# Patient Record
Sex: Male | Born: 1965 | Race: Black or African American | Hispanic: No | Smoking: Never smoker
Health system: Southern US, Community
[De-identification: ages and names within clinical notes are randomized; demographics above are authoritative.]

## PROBLEM LIST (undated history)

## (undated) DIAGNOSIS — E7800 Pure hypercholesterolemia, unspecified: Secondary | ICD-10-CM

## (undated) DIAGNOSIS — K219 Gastro-esophageal reflux disease without esophagitis: Secondary | ICD-10-CM

## (undated) DIAGNOSIS — C099 Malignant neoplasm of tonsil, unspecified: Secondary | ICD-10-CM

## (undated) DIAGNOSIS — I10 Essential (primary) hypertension: Secondary | ICD-10-CM

## (undated) DIAGNOSIS — L0292 Furuncle, unspecified: Secondary | ICD-10-CM

## (undated) HISTORY — PX: OTHER SURGICAL HISTORY: SHX169

## (undated) HISTORY — PX: EYE SURGERY: SHX253

---

## 2003-04-14 ENCOUNTER — Emergency Department (HOSPITAL_COMMUNITY): Admission: EM | Admit: 2003-04-14 | Discharge: 2003-04-14 | Payer: Self-pay | Admitting: *Deleted

## 2003-04-14 ENCOUNTER — Encounter: Payer: Self-pay | Admitting: *Deleted

## 2006-12-04 ENCOUNTER — Ambulatory Visit (HOSPITAL_COMMUNITY): Admission: RE | Admit: 2006-12-04 | Discharge: 2006-12-04 | Payer: Self-pay | Admitting: Urology

## 2008-10-23 ENCOUNTER — Encounter (INDEPENDENT_AMBULATORY_CARE_PROVIDER_SITE_OTHER): Payer: Self-pay | Admitting: General Surgery

## 2008-10-23 ENCOUNTER — Ambulatory Visit (HOSPITAL_COMMUNITY): Admission: RE | Admit: 2008-10-23 | Discharge: 2008-10-23 | Payer: Self-pay | Admitting: General Surgery

## 2010-07-15 ENCOUNTER — Emergency Department (HOSPITAL_COMMUNITY): Admission: EM | Admit: 2010-07-15 | Discharge: 2010-07-15 | Payer: Self-pay | Admitting: Emergency Medicine

## 2011-03-18 NOTE — Op Note (Signed)
[name], Jonathan Hudson            ACCOUNT NO.:  [id number]   MEDICAL RECORD NO.:  [id number]          PATIENT TYPE:  AMB   LOCATION:  DAY                           FACILITY:  [facility]   PHYSICIAN:  Dalia Heading, M.D.  DATE OF BIRTH:  1966/09/05   DATE OF PROCEDURE:  10/23/2008  DATE OF DISCHARGE:                               OPERATIVE REPORT   PREOPERATIVE DIAGNOSIS:  Ganglion cyst, left wrist.   POSTOPERATIVE DIAGNOSIS:  Ganglion cyst, left wrist.   PROCEDURE:  Excision of ganglion cyst, left wrist.   SURGEON:  Dalia Heading, MD   ANESTHESIA:  Regional.   INDICATIONS:  The patient is a 45 year old black male who presents with  a ganglion cyst of the left wrist.  The risks and benefits of the  procedure including bleeding, infection, and recurrence of the cyst were  fully explained to the patient, gave informed consent.   PROCEDURE NOTE:  The patient was in the supine position.  Regional  anesthesia was administered to the left arm.  A tourniquet was used  during the procedure.  A longitudinal incision was made over the cyst on  the left wrist.  This was taken down to the base of the cyst and the  cyst was excised without difficulty.  It was sent to Pathology for  further examination.  The subcutaneous layer was reapproximated using a  5-0 Vicryl interrupted suture.  The tourniquet was then released.  Any  bleeding was controlled using Bovie electrocautery.  The skin was closed  using 4-0 nylon interrupted sutures.  0.5% Sensorcaine was instilled  into the surrounding wound.  Betadine ointment and dry sterile dressing  were applied.  An Ace wrap was then applied.   All tape and needle counts were correct at the end of the procedure.  The patient was transferred to day surgery in stable condition.   COMPLICATIONS:  None.   SPECIMEN:  Ganglion cyst, left wrist.   BLOOD LOSS:  Minimal.      Dalia Heading, M.D.  Electronically Signed     MAJ/MEDQ  D:  10/23/2008   T:  10/23/2008  Job:  [id number]   cc:   Delbert Harness, MD

## 2011-03-21 NOTE — H&P (Signed)
Jonathan Hudson, Jonathan Hudson            ACCOUNT NO.:  [id number]   MEDICAL RECORD NO.:  [id number]          PATIENT TYPE:  AMB   LOCATION:  DAY                           FACILITY:  [facility]   PHYSICIAN:  Dalia Heading, M.D.  DATE OF BIRTH:  [birth year]   DATE OF ADMISSION:  DATE OF DISCHARGE:  LH                              HISTORY & PHYSICAL   CHIEF COMPLAINT:  Ganglion cyst, left wrist.   HISTORY OF PRESENT ILLNESS:  The patient is a 45 year old black male who  is referred for evaluation for treatment of a mass on his left wrist.  It has been present for several months.  It seems to be increasing in  size and is causing him discomfort.   PAST MEDICAL HISTORY:  High cholesterol levels.   PAST SURGICAL HISTORY:  Unremarkable.   CURRENT MEDICATIONS:  Lipitor.   ALLERGIES:  No known drug allergies.   REVIEW OF SYSTEMS:  The patient does smoke a pack of cigarettes a day.  Denies any alcohol use.  Denies any other cardiopulmonary difficulties  or bleeding disorders.   PHYSICAL EXAMINATION:  GENERAL:  The patient is a well-developed, well-  nourished black male in no acute distress.  LUNGS:  Clear to auscultation with equal breath sounds bilaterally.  HEART:  Regular rate and rhythm without S3, S4, or murmurs.  EXTREMITIES:  Reveals a 2-cm ovoid mobile mass in the left wrist  dorsally.   IMPRESSION:  Ganglion cyst, left wrist.   PLAN:  The patient is scheduled for excision of the ganglion cyst, left  wrist on October 23, 2008.  The risks and benefits of the procedure  including bleeding, infection, and recurrence of the cyst were fully  explained to the patient, gave informed consent.      Dalia Heading, M.D.     MAJ/MEDQ  D:  09/26/2008  T:  09/27/2008  Job:  [id number]   cc:   Melvyn Novas, MD  Fax: [phone]   Short Stay at [facility]

## 2011-03-21 NOTE — H&P (Signed)
Jonathan Hudson, Jonathan Hudson            ACCOUNT NO.:  [id number]   MEDICAL RECORD NO.:  [id number]          PATIENT TYPE:  AMB   LOCATION:  DAY                           FACILITY:  [facility]   PHYSICIAN:  Dalia Heading, M.D.  DATE OF BIRTH:  07-07-1966   DATE OF ADMISSION:  DATE OF DISCHARGE:  LH                              HISTORY & PHYSICAL   CHIEF COMPLAINT:  Ganglion cyst, left wrist.   HISTORY OF PRESENT ILLNESS:  The patient is a 45 year old black male who  is referred for evaluation for treatment of a mass on his left wrist.  It has been present for several months.  It seems to be increasing in  size and is causing him discomfort.   PAST MEDICAL HISTORY:  High cholesterol levels.   PAST SURGICAL HISTORY:  Unremarkable.   CURRENT MEDICATIONS:  Lipitor.   ALLERGIES:  No known drug allergies.   REVIEW OF SYSTEMS:  The patient does smoke a pack of cigarettes a day.  Denies any alcohol use.  Denies any other cardiopulmonary difficulties  or bleeding disorders.   PHYSICAL EXAMINATION:  GENERAL:  The patient is a well-developed, well-  nourished black male in no acute distress.  LUNGS:  Clear to auscultation with equal breath sounds bilaterally.  HEART:  Regular rate and rhythm without S3, S4, or murmurs.  EXTREMITIES:  Reveals a 2-cm ovoid mobile mass in the left wrist  dorsally.   IMPRESSION:  Ganglion cyst, left wrist.   PLAN:  The patient is scheduled for excision of the ganglion cyst, left  wrist on October 23, 2008.  The risks and benefits of the procedure  including bleeding, infection, and recurrence of the cyst were fully  explained to the patient, gave informed consent.      Dalia Heading, M.D.  Electronically Signed     MAJ/MEDQ  D:  09/26/2008  T:  09/27/2008  Job:  [id number]   cc:   Melvyn Novas, MD  Fax: [phone]   Short Stay at [facility]

## 2011-04-08 ENCOUNTER — Other Ambulatory Visit (HOSPITAL_COMMUNITY): Payer: Self-pay | Admitting: Orthopaedic Surgery

## 2011-04-08 DIAGNOSIS — R52 Pain, unspecified: Secondary | ICD-10-CM

## 2011-04-11 ENCOUNTER — Ambulatory Visit (HOSPITAL_COMMUNITY)
Admission: RE | Admit: 2011-04-11 | Discharge: 2011-04-11 | Disposition: A | Discharge status: Home / Self Care | Payer: BC Managed Care – PPO | Source: Ambulatory Visit | Attending: Orthopaedic Surgery | Admitting: Orthopaedic Surgery

## 2011-04-11 DIAGNOSIS — IMO0002 Reserved for concepts with insufficient information to code with codable children: Secondary | ICD-10-CM | POA: Insufficient documentation

## 2011-04-11 DIAGNOSIS — M75100 Unspecified rotator cuff tear or rupture of unspecified shoulder, not specified as traumatic: Secondary | ICD-10-CM | POA: Insufficient documentation

## 2011-04-11 DIAGNOSIS — M25519 Pain in unspecified shoulder: Secondary | ICD-10-CM | POA: Insufficient documentation

## 2011-04-11 DIAGNOSIS — M67919 Unspecified disorder of synovium and tendon, unspecified shoulder: Secondary | ICD-10-CM | POA: Insufficient documentation

## 2011-04-11 DIAGNOSIS — R52 Pain, unspecified: Secondary | ICD-10-CM

## 2011-04-11 DIAGNOSIS — M719 Bursopathy, unspecified: Secondary | ICD-10-CM | POA: Insufficient documentation

## 2011-08-08 LAB — BASIC METABOLIC PANEL
BUN: 11 mg/dL (ref 6–23)
CO2: 26 mEq/L (ref 19–32)
Calcium: 9.2 mg/dL (ref 8.4–10.5)
Chloride: 107 mEq/L (ref 96–112)
Creatinine, Ser: 0.89 mg/dL (ref 0.4–1.5)
GFR calc Af Amer: >60 mL/min (ref >60)
GFR calc non Af Amer: >60 mL/min (ref >60)
Glucose, Bld: 104 mg/dL — ABNORMAL HIGH (ref 70–99)
Potassium: 4.2 mEq/L (ref 3.5–5.1)
Sodium: 138 mEq/L (ref 135–145)

## 2011-08-08 LAB — CBC
HCT: 45.5 % (ref 39.0–52.0)
Hemoglobin: 14.9 g/dL (ref 13.0–17.0)
MCHC: 32.8 g/dL (ref 30.0–36.0)
MCV: 94.3 fL (ref 78.0–100.0)
Platelets: 243 10*3/uL (ref 150–400)
RBC: 4.83 MIL/uL (ref 4.22–5.81)
RDW: 13.9 % (ref 11.5–15.5)
WBC: 8.8 10*3/uL (ref 4.0–10.5)

## 2013-05-07 ENCOUNTER — Emergency Department (HOSPITAL_COMMUNITY)
Admission: EM | Admit: 2013-05-07 | Discharge: 2013-05-07 | Disposition: A | Discharge status: Home / Self Care | Payer: Managed Care, Other (non HMO) | Attending: Emergency Medicine | Admitting: Emergency Medicine

## 2013-05-07 ENCOUNTER — Encounter (HOSPITAL_COMMUNITY): Payer: Self-pay | Admitting: *Deleted

## 2013-05-07 DIAGNOSIS — Z79899 Other long term (current) drug therapy: Secondary | ICD-10-CM | POA: Insufficient documentation

## 2013-05-07 DIAGNOSIS — L0231 Cutaneous abscess of buttock: Secondary | ICD-10-CM | POA: Insufficient documentation

## 2013-05-07 DIAGNOSIS — L03319 Cellulitis of trunk, unspecified: Secondary | ICD-10-CM | POA: Insufficient documentation

## 2013-05-07 DIAGNOSIS — L02219 Cutaneous abscess of trunk, unspecified: Secondary | ICD-10-CM | POA: Insufficient documentation

## 2013-05-07 DIAGNOSIS — L02215 Cutaneous abscess of perineum: Secondary | ICD-10-CM

## 2013-05-07 DIAGNOSIS — L02419 Cutaneous abscess of limb, unspecified: Secondary | ICD-10-CM | POA: Insufficient documentation

## 2013-05-07 DIAGNOSIS — L03317 Cellulitis of buttock: Secondary | ICD-10-CM | POA: Insufficient documentation

## 2013-05-07 DIAGNOSIS — E7800 Pure hypercholesterolemia, unspecified: Secondary | ICD-10-CM | POA: Insufficient documentation

## 2013-05-07 HISTORY — DX: Pure hypercholesterolemia, unspecified: E78.00

## 2013-05-07 MED ORDER — SULFAMETHOXAZOLE-TRIMETHOPRIM 800-160 MG PO TABS: 1.0000 | ORAL_TABLET | Freq: Two times a day (BID) | ORAL | Status: AC

## 2013-05-07 MED ORDER — HYDROCODONE-ACETAMINOPHEN 5-325 MG PO TABS: 1.0000 | ORAL_TABLET | ORAL | Status: DC | PRN

## 2013-05-07 NOTE — ED Notes (Signed)
Pt states multiple boils to buttocks, left upper let and right groin area.

## 2013-05-07 NOTE — ED Provider Notes (Signed)
History    CSN: [id number] Arrival date & time 05/07/13  0865  First MD Initiated Contact with Patient 05/07/13 [phone]     Chief Complaint  Patient presents with  . Abscess   (Consider location/radiation/quality/duration/timing/severity/associated sxs/prior Treatment) HPI Comments: Jonathan Hudson is a 46 y.o. Male with a history of occasional boil, mostly in his groin which resolves with warm compresses  And spontaneous drainage.  He developed a similar boil in his right groin which has started to drain today using warm water soaks.  However,  He develop 2 new tender areas,  One on the upper posterior left leg and the other on his left buttock. Both areas are tender and are not yet draining.  Pain is worse with palpation and with sitting.  He has taken ibuprofen without relief of pain.  He denies fever, chills, nausea.     The history is provided by the patient.   Past Medical History  Diagnosis Date  . Hypercholesteremia    History reviewed. No pertinent past surgical history. No family history on file. History  Substance Use Topics  . Smoking status: Never Smoker   . Smokeless tobacco: Not on file  . Alcohol Use: Yes     Comment: Occ    Review of Systems  Constitutional: Negative for fever and chills.  HENT: Negative for facial swelling.   Respiratory: Negative for shortness of breath.   Gastrointestinal: Negative for nausea.  Skin: Positive for wound. Negative for color change.  Neurological: Negative for numbness.    Allergies  Review of patient's allergies indicates no known allergies.  Home Medications   Current Outpatient Rx  Name  Route  Sig  Dispense  Refill  . atorvastatin (LIPITOR) 40 MG tablet   Oral   Take 20 mg by mouth daily.         Marland Kitchen ibuprofen (ADVIL,MOTRIN) 800 MG tablet   Oral   Take 800 mg by mouth every 8 (eight) hours as needed for pain.         Marland Kitchen HYDROcodone-acetaminophen (NORCO/VICODIN) 5-325 MG per tablet   Oral   Take 1  tablet by mouth every 4 (four) hours as needed for pain.   20 tablet   0   . sulfamethoxazole-trimethoprim (BACTRIM DS,SEPTRA DS) 800-160 MG per tablet   Oral   Take 1 tablet by mouth 2 (two) times daily.   14 tablet   0    BP 139/88  Pulse 85  Temp(Src) 98.4 F (36.9 C) (Oral)  Resp 16  SpO2 98% Physical Exam  Constitutional: He appears well-developed and well-nourished. No distress.  HENT:  Head: Normocephalic.  Neck: Normal range of motion.  Cardiovascular: Normal rate.   Pulmonary/Chest: Effort normal.  Musculoskeletal: Normal range of motion. He exhibits no edema.  Skin:  Dime sized abscesses,  One in right groin which is draining,  Indurated lesions left upper thigh near gluteal fold,  The other on left medial buttock.  No fluctuance, no drainage, no surrounding erythema.     ED Course  Procedures (including critical care time) Labs Reviewed - No data to display No results found. 1. Abscess of multiple sites of perineum     MDM  Pt with 3 small skin infections/ one with active drainage,  The other 2 with no fluctuance, small, not ready for incision.  Encouraged continued warm soaks, prescribed bactrim, hydrocodone.  Encouraged return here or to pcp if lesions do not resolve spontaneously as the right groin appears to  be doing.  Pt understands plan.  Burgess Amor, PA-C 05/07/13 2148

## 2013-05-08 NOTE — ED Provider Notes (Signed)
Medical screening examination/treatment/procedure(s) were performed by non-physician practitioner and as supervising physician I was immediately available for consultation/collaboration.   Laray Anger, DO 05/08/13 Darliss Ridgel

## 2013-12-28 ENCOUNTER — Emergency Department (HOSPITAL_COMMUNITY): Payer: Managed Care, Other (non HMO)

## 2013-12-28 ENCOUNTER — Observation Stay (HOSPITAL_COMMUNITY)
Admission: EM | Admit: 2013-12-28 | Discharge: 2013-12-29 | Disposition: A | Discharge status: Home / Self Care | Payer: Managed Care, Other (non HMO) | Attending: Orthopedic Surgery | Admitting: Orthopedic Surgery

## 2013-12-28 ENCOUNTER — Encounter (HOSPITAL_COMMUNITY): Payer: Self-pay | Admitting: Emergency Medicine

## 2013-12-28 ENCOUNTER — Observation Stay (HOSPITAL_COMMUNITY): Payer: Managed Care, Other (non HMO)

## 2013-12-28 ENCOUNTER — Encounter (HOSPITAL_COMMUNITY): Payer: Managed Care, Other (non HMO) | Admitting: Anesthesiology

## 2013-12-28 ENCOUNTER — Encounter (HOSPITAL_COMMUNITY)
Admission: EM | Disposition: A | Discharge status: Home / Self Care | Payer: Self-pay | Source: Home / Self Care | Attending: Orthopedic Surgery

## 2013-12-28 ENCOUNTER — Observation Stay (HOSPITAL_COMMUNITY): Payer: Managed Care, Other (non HMO) | Admitting: Anesthesiology

## 2013-12-28 DIAGNOSIS — I10 Essential (primary) hypertension: Secondary | ICD-10-CM | POA: Insufficient documentation

## 2013-12-28 DIAGNOSIS — S52309A Unspecified fracture of shaft of unspecified radius, initial encounter for closed fracture: Principal | ICD-10-CM | POA: Insufficient documentation

## 2013-12-28 DIAGNOSIS — S52201A Unspecified fracture of shaft of right ulna, initial encounter for closed fracture: Secondary | ICD-10-CM | POA: Diagnosis present

## 2013-12-28 DIAGNOSIS — S5290XA Unspecified fracture of unspecified forearm, initial encounter for closed fracture: Secondary | ICD-10-CM | POA: Diagnosis present

## 2013-12-28 DIAGNOSIS — E7800 Pure hypercholesterolemia, unspecified: Secondary | ICD-10-CM | POA: Insufficient documentation

## 2013-12-28 DIAGNOSIS — W108XXA Fall (on) (from) other stairs and steps, initial encounter: Secondary | ICD-10-CM | POA: Insufficient documentation

## 2013-12-28 DIAGNOSIS — S52301A Unspecified fracture of shaft of right radius, initial encounter for closed fracture: Secondary | ICD-10-CM

## 2013-12-28 DIAGNOSIS — S52209A Unspecified fracture of shaft of unspecified ulna, initial encounter for closed fracture: Principal | ICD-10-CM

## 2013-12-28 HISTORY — PX: OPEN REDUCTION INTERNAL FIXATION (ORIF) DISTAL RADIAL FRACTURE: SHX5989

## 2013-12-28 HISTORY — PX: ORIF ULNAR FRACTURE: SHX5417

## 2013-12-28 LAB — SURGICAL PCR SCREEN
MRSA, PCR: NEGATIVE
Staphylococcus aureus: NEGATIVE

## 2013-12-28 LAB — CBC WITH DIFFERENTIAL/PLATELET
Basophils Absolute: 0 10*3/uL (ref 0.0–0.1)
Basophils Relative: 0 % (ref 0–1)
Eosinophils Absolute: 0.5 10*3/uL (ref 0.0–0.7)
Eosinophils Relative: 3 % (ref 0–5)
HCT: 43.2 % (ref 39.0–52.0)
Hemoglobin: 15 g/dL (ref 13.0–17.0)
Lymphocytes Relative: 44 % (ref 12–46)
Lymphs Abs: 6.6 10*3/uL — ABNORMAL HIGH (ref 0.7–4.0)
MCH: 32 pg (ref 26.0–34.0)
MCHC: 34.7 g/dL (ref 30.0–36.0)
MCV: 92.1 fL (ref 78.0–100.0)
Monocytes Absolute: 1.1 10*3/uL — ABNORMAL HIGH (ref 0.1–1.0)
Monocytes Relative: 7 % (ref 3–12)
Neutro Abs: 6.8 10*3/uL (ref 1.7–7.7)
Neutrophils Relative %: 46 % (ref 43–77)
Platelets: 238 10*3/uL (ref 150–400)
RBC: 4.69 MIL/uL (ref 4.22–5.81)
RDW: 13.9 % (ref 11.5–15.5)
WBC: 15 10*3/uL — ABNORMAL HIGH (ref 4.0–10.5)

## 2013-12-28 LAB — BASIC METABOLIC PANEL
BUN: 17 mg/dL (ref 6–23)
CO2: 24 mEq/L (ref 19–32)
Calcium: 8.9 mg/dL (ref 8.4–10.5)
Chloride: 103 mEq/L (ref 96–112)
Creatinine, Ser: 1.14 mg/dL (ref 0.50–1.35)
GFR calc Af Amer: 87 mL/min — ABNORMAL LOW (ref >90)
GFR calc non Af Amer: 75 mL/min — ABNORMAL LOW (ref >90)
Glucose, Bld: 146 mg/dL — ABNORMAL HIGH (ref 70–99)
Potassium: 3.5 mEq/L — ABNORMAL LOW (ref 3.7–5.3)
Sodium: 141 mEq/L (ref 137–147)

## 2013-12-28 SURGERY — OPEN REDUCTION INTERNAL FIXATION (ORIF) ULNAR FRACTURE
Anesthesia: General | Site: Arm Lower | Laterality: Right

## 2013-12-28 MED ORDER — METOCLOPRAMIDE HCL 10 MG PO TABS: 5.0000 mg | ORAL_TABLET | Freq: Three times a day (TID) | ORAL | Status: DC | PRN

## 2013-12-28 MED ORDER — BUPIVACAINE HCL (PF) 0.5 % IJ SOLN
INTRAMUSCULAR | Status: AC
  Filled 2013-12-28: qty 30

## 2013-12-28 MED ORDER — SUFENTANIL CITRATE 50 MCG/ML IV SOLN
INTRAVENOUS | Status: DC | PRN
  Administered 2013-12-28 (×5): 10 ug via INTRAVENOUS

## 2013-12-28 MED ORDER — DOCUSATE SODIUM 100 MG PO CAPS
100.0000 mg | ORAL_CAPSULE | Freq: Two times a day (BID) | ORAL | Status: DC
  Administered 2013-12-28 – 2013-12-29 (×2): 100 mg via ORAL
  Filled 2013-12-28 (×2): qty 1

## 2013-12-28 MED ORDER — ONDANSETRON HCL 4 MG/2ML IJ SOLN
4.0000 mg | Freq: Once | INTRAMUSCULAR | Status: AC
  Administered 2013-12-28: 4 mg via INTRAVENOUS
  Filled 2013-12-28: qty 2

## 2013-12-28 MED ORDER — LIDOCAINE HCL (PF) 1 % IJ SOLN
INTRAMUSCULAR | Status: AC
  Filled 2013-12-28: qty 5

## 2013-12-28 MED ORDER — LACTATED RINGERS IV SOLN
INTRAVENOUS | Status: DC
  Administered 2013-12-28: 12:00:00 via INTRAVENOUS

## 2013-12-28 MED ORDER — FENTANYL CITRATE 0.05 MG/ML IJ SOLN
INTRAMUSCULAR | Status: AC
  Filled 2013-12-28: qty 2

## 2013-12-28 MED ORDER — LISINOPRIL 5 MG PO TABS
5.0000 mg | ORAL_TABLET | Freq: Every day | ORAL | Status: DC
  Administered 2013-12-28 – 2013-12-29 (×2): 5 mg via ORAL
  Filled 2013-12-28 (×2): qty 1

## 2013-12-28 MED ORDER — ACETAMINOPHEN 325 MG PO TABS: 650.0000 mg | ORAL_TABLET | Freq: Four times a day (QID) | ORAL | Status: DC | PRN

## 2013-12-28 MED ORDER — MIDAZOLAM HCL 2 MG/2ML IJ SOLN
1.0000 mg | INTRAMUSCULAR | Status: DC | PRN
  Administered 2013-12-28: 2 mg via INTRAVENOUS

## 2013-12-28 MED ORDER — CEFAZOLIN SODIUM-DEXTROSE 2-3 GM-% IV SOLR
2.0000 g | INTRAVENOUS | Status: AC
  Administered 2013-12-28: 2 g via INTRAVENOUS
  Filled 2013-12-28: qty 50

## 2013-12-28 MED ORDER — MIDAZOLAM HCL 2 MG/2ML IJ SOLN
INTRAMUSCULAR | Status: AC
  Filled 2013-12-28: qty 2

## 2013-12-28 MED ORDER — ONDANSETRON HCL 4 MG PO TABS
4.0000 mg | ORAL_TABLET | Freq: Four times a day (QID) | ORAL | Status: DC | PRN
Start: 2013-12-28 — End: 2013-12-29

## 2013-12-28 MED ORDER — LACTATED RINGERS IV SOLN
INTRAVENOUS | Status: DC | PRN
  Administered 2013-12-28 (×2): via INTRAVENOUS

## 2013-12-28 MED ORDER — HYDROMORPHONE HCL PF 1 MG/ML IJ SOLN
1.0000 mg | Freq: Once | INTRAMUSCULAR | Status: AC
  Administered 2013-12-28: 1 mg via INTRAVENOUS
  Filled 2013-12-28: qty 1

## 2013-12-28 MED ORDER — PROPOFOL 10 MG/ML IV BOLUS
INTRAVENOUS | Status: DC | PRN
  Administered 2013-12-28: 170 mg via INTRAVENOUS

## 2013-12-28 MED ORDER — SUCCINYLCHOLINE CHLORIDE 20 MG/ML IJ SOLN
INTRAMUSCULAR | Status: DC | PRN
  Administered 2013-12-28: 140 mg via INTRAVENOUS

## 2013-12-28 MED ORDER — ONDANSETRON HCL 4 MG/2ML IJ SOLN: 4.0000 mg | Freq: Once | INTRAMUSCULAR | Status: DC | PRN

## 2013-12-28 MED ORDER — SUCCINYLCHOLINE CHLORIDE 20 MG/ML IJ SOLN
INTRAMUSCULAR | Status: AC
  Filled 2013-12-28: qty 1

## 2013-12-28 MED ORDER — OXYCODONE-ACETAMINOPHEN 5-325 MG PO TABS
1.0000 | ORAL_TABLET | ORAL | Status: DC
  Administered 2013-12-28 – 2013-12-29 (×5): 1 via ORAL
  Filled 2013-12-28 (×5): qty 1

## 2013-12-28 MED ORDER — FENTANYL CITRATE 0.05 MG/ML IJ SOLN
INTRAMUSCULAR | Status: DC | PRN
  Administered 2013-12-28 (×2): 50 ug via INTRAVENOUS

## 2013-12-28 MED ORDER — PHENOL 1.4 % MT LIQD: 1.0000 | OROMUCOSAL | Status: DC | PRN

## 2013-12-28 MED ORDER — GLYCOPYRROLATE 0.2 MG/ML IJ SOLN
0.2000 mg | Freq: Once | INTRAMUSCULAR | Status: AC
  Administered 2013-12-28: 0.2 mg via INTRAVENOUS

## 2013-12-28 MED ORDER — GLYCOPYRROLATE 0.2 MG/ML IJ SOLN
INTRAMUSCULAR | Status: AC
  Filled 2013-12-28: qty 1

## 2013-12-28 MED ORDER — HYDROMORPHONE HCL PF 1 MG/ML IJ SOLN
1.0000 mg | Freq: Once | INTRAMUSCULAR | Status: AC
  Administered 2013-12-28: 1 mg via INTRAVENOUS

## 2013-12-28 MED ORDER — MENTHOL 3 MG MT LOZG: 1.0000 | LOZENGE | OROMUCOSAL | Status: DC | PRN

## 2013-12-28 MED ORDER — FENTANYL CITRATE 0.05 MG/ML IJ SOLN
INTRAMUSCULAR | Status: AC
Start: 2013-12-28 — End: 2013-12-28
  Filled 2013-12-28: qty 5

## 2013-12-28 MED ORDER — ROCURONIUM BROMIDE 50 MG/5ML IV SOLN
INTRAVENOUS | Status: AC
  Filled 2013-12-28: qty 1

## 2013-12-28 MED ORDER — ONDANSETRON HCL 4 MG/2ML IJ SOLN
INTRAMUSCULAR | Status: AC
  Filled 2013-12-28: qty 2

## 2013-12-28 MED ORDER — CEFAZOLIN SODIUM-DEXTROSE 2-3 GM-% IV SOLR
INTRAVENOUS | Status: AC
  Filled 2013-12-28: qty 50

## 2013-12-28 MED ORDER — GLYCOPYRROLATE 0.2 MG/ML IJ SOLN
INTRAMUSCULAR | Status: DC | PRN
  Administered 2013-12-28: 0.2 mg via INTRAVENOUS

## 2013-12-28 MED ORDER — MORPHINE SULFATE 4 MG/ML IJ SOLN: 4.0000 mg | INTRAMUSCULAR | Status: DC | PRN

## 2013-12-28 MED ORDER — OXYCODONE HCL 5 MG PO TABS
5.0000 mg | ORAL_TABLET | Freq: Once | ORAL | Status: AC
  Administered 2013-12-28: 5 mg via ORAL
  Filled 2013-12-28: qty 1

## 2013-12-28 MED ORDER — SODIUM CHLORIDE 0.9 % IR SOLN
Status: DC | PRN
  Administered 2013-12-28 (×2): 1000 mL

## 2013-12-28 MED ORDER — KETOROLAC TROMETHAMINE 30 MG/ML IJ SOLN
30.0000 mg | Freq: Once | INTRAMUSCULAR | Status: AC
  Administered 2013-12-28: 30 mg via INTRAVENOUS
  Filled 2013-12-28: qty 1

## 2013-12-28 MED ORDER — ONDANSETRON HCL 4 MG/2ML IJ SOLN: 4.0000 mg | Freq: Four times a day (QID) | INTRAMUSCULAR | Status: DC | PRN

## 2013-12-28 MED ORDER — ONDANSETRON HCL 4 MG/2ML IJ SOLN: 4.0000 mg | Freq: Three times a day (TID) | INTRAMUSCULAR | Status: DC | PRN

## 2013-12-28 MED ORDER — ALUM & MAG HYDROXIDE-SIMETH 200-200-20 MG/5ML PO SUSP: 30.0000 mL | ORAL | Status: DC | PRN

## 2013-12-28 MED ORDER — PROPOFOL 10 MG/ML IV BOLUS
INTRAVENOUS | Status: AC
  Filled 2013-12-28: qty 20

## 2013-12-28 MED ORDER — BUPIVACAINE-EPINEPHRINE PF 0.5-1:200000 % IJ SOLN
INTRAMUSCULAR | Status: DC | PRN
  Administered 2013-12-28: 30 mL

## 2013-12-28 MED ORDER — SUFENTANIL CITRATE 50 MCG/ML IV SOLN
INTRAVENOUS | Status: AC
  Filled 2013-12-28: qty 1

## 2013-12-28 MED ORDER — METOCLOPRAMIDE HCL 5 MG/ML IJ SOLN: 5.0000 mg | Freq: Three times a day (TID) | INTRAMUSCULAR | Status: DC | PRN

## 2013-12-28 MED ORDER — POTASSIUM CHLORIDE 10 MEQ/100ML IV SOLN
10.0000 meq | Freq: Once | INTRAVENOUS | Status: AC
Start: 2013-12-28 — End: 2013-12-28
  Administered 2013-12-28: 10 meq via INTRAVENOUS
  Filled 2013-12-28: qty 100

## 2013-12-28 MED ORDER — CEFAZOLIN SODIUM-DEXTROSE 2-3 GM-% IV SOLR
2.0000 g | Freq: Four times a day (QID) | INTRAVENOUS | Status: AC
  Administered 2013-12-28 – 2013-12-29 (×2): 2 g via INTRAVENOUS
  Filled 2013-12-28 (×2): qty 50

## 2013-12-28 MED ORDER — ATORVASTATIN CALCIUM 20 MG PO TABS
20.0000 mg | ORAL_TABLET | Freq: Every day | ORAL | Status: DC
  Administered 2013-12-28: 20 mg via ORAL
  Filled 2013-12-28: qty 1

## 2013-12-28 MED ORDER — MORPHINE SULFATE 2 MG/ML IJ SOLN
0.5000 mg | INTRAMUSCULAR | Status: DC | PRN
  Administered 2013-12-28 – 2013-12-29 (×2): 0.5 mg via INTRAVENOUS
  Filled 2013-12-28 (×2): qty 1

## 2013-12-28 MED ORDER — POTASSIUM CHLORIDE IN NACL 20-0.9 MEQ/L-% IV SOLN: INTRAVENOUS | Status: DC

## 2013-12-28 MED ORDER — HYDROMORPHONE HCL PF 1 MG/ML IJ SOLN
INTRAMUSCULAR | Status: AC
  Administered 2013-12-28: 1 mg via INTRAVENOUS
  Filled 2013-12-28: qty 1

## 2013-12-28 MED ORDER — ACETAMINOPHEN 650 MG RE SUPP: 650.0000 mg | Freq: Four times a day (QID) | RECTAL | Status: DC | PRN

## 2013-12-28 MED ORDER — FENTANYL CITRATE 0.05 MG/ML IJ SOLN
25.0000 ug | INTRAMUSCULAR | Status: DC | PRN
  Administered 2013-12-28 (×2): 50 ug via INTRAVENOUS

## 2013-12-28 MED ORDER — HYDROMORPHONE HCL PF 1 MG/ML IJ SOLN
1.0000 mg | INTRAMUSCULAR | Status: DC | PRN
  Administered 2013-12-28: 1 mg via INTRAVENOUS
  Filled 2013-12-28: qty 1

## 2013-12-28 MED ORDER — ONDANSETRON HCL 4 MG/2ML IJ SOLN
4.0000 mg | Freq: Once | INTRAMUSCULAR | Status: AC
  Administered 2013-12-28: 4 mg via INTRAVENOUS

## 2013-12-28 MED ORDER — FENTANYL CITRATE 0.05 MG/ML IJ SOLN
25.0000 ug | INTRAMUSCULAR | Status: AC
  Administered 2013-12-28 (×2): 25 ug via INTRAVENOUS

## 2013-12-28 MED ORDER — POTASSIUM CHLORIDE IN NACL 20-0.9 MEQ/L-% IV SOLN
INTRAVENOUS | Status: DC
  Administered 2013-12-28: 07:00:00 via INTRAVENOUS

## 2013-12-28 MED ORDER — CHLORHEXIDINE GLUCONATE 4 % EX LIQD
60.0000 mL | Freq: Once | CUTANEOUS | Status: AC
  Administered 2013-12-28: 4 via TOPICAL
  Filled 2013-12-28: qty 60

## 2013-12-28 SURGICAL SUPPLY — 64 items
BAG HAMPER (MISCELLANEOUS) ×2 IMPLANT
BANDAGE ELASTIC 2 VELCRO NS LF (GAUZE/BANDAGES/DRESSINGS) ×1 IMPLANT
BANDAGE ELASTIC 3 VELCRO NS (GAUZE/BANDAGES/DRESSINGS) ×1 IMPLANT
BANDAGE ELASTIC 3 VELCRO ST LF (GAUZE/BANDAGES/DRESSINGS) IMPLANT
BANDAGE ESMARK 4X12 BL STRL LF (DISPOSABLE) ×1 IMPLANT
BANDAGE GAUZE ELAST BULKY 4 IN (GAUZE/BANDAGES/DRESSINGS) ×1 IMPLANT
BIT DRILL 2.8 (BIT) ×1
BIT DRILL CANN QC 2.8X165 (BIT) IMPLANT
BLADE SURG SZ10 CARB STEEL (BLADE) ×2 IMPLANT
BNDG CMPR 12X4 ELC STRL LF (DISPOSABLE) ×1
BNDG COHESIVE 4X5 TAN STRL (GAUZE/BANDAGES/DRESSINGS) ×2 IMPLANT
BNDG ESMARK 4X12 BLUE STRL LF (DISPOSABLE) ×2
CHLORAPREP W/TINT 26ML (MISCELLANEOUS) ×2 IMPLANT
CLOTH BEACON ORANGE TIMEOUT ST (SAFETY) ×2 IMPLANT
COVER LIGHT HANDLE STERIS (MISCELLANEOUS) ×8 IMPLANT
COVER PROBE W GEL 5X96 (DRAPES) ×2 IMPLANT
CUFF TOURNIQUET SINGLE 18IN (TOURNIQUET CUFF) ×2 IMPLANT
DRAPE C-ARM FOLDED MOBILE STRL (DRAPES) ×2 IMPLANT
DRILL BIT 2.8MM (BIT) ×2
DRSG XEROFORM 1X8 (GAUZE/BANDAGES/DRESSINGS) IMPLANT
ELECT REM PT RETURN 9FT ADLT (ELECTROSURGICAL) ×2
ELECTRODE REM PT RTRN 9FT ADLT (ELECTROSURGICAL) ×1 IMPLANT
GAUZE KERLIX 2  STERILE LF (GAUZE/BANDAGES/DRESSINGS) ×2 IMPLANT
GAUZE XEROFORM 5X9 LF (GAUZE/BANDAGES/DRESSINGS) ×1 IMPLANT
GLOVE BIOGEL PI IND STRL 7.0 (GLOVE) IMPLANT
GLOVE BIOGEL PI INDICATOR 7.0 (GLOVE) ×3
GLOVE ECLIPSE 6.5 STRL STRAW (GLOVE) ×2 IMPLANT
GLOVE EXAM NITRILE MD LF STRL (GLOVE) ×2 IMPLANT
GLOVE OPTIFIT SS 6.5 STRL BRWN (GLOVE) ×1 IMPLANT
GLOVE SKINSENSE NS SZ8.0 LF (GLOVE) ×1
GLOVE SKINSENSE STRL SZ8.0 LF (GLOVE) ×1 IMPLANT
GLOVE SS N UNI LF 8.5 STRL (GLOVE) ×2 IMPLANT
GOWN STRL REUS W/TWL LRG LVL3 (GOWN DISPOSABLE) ×5 IMPLANT
GOWN STRL REUS W/TWL XL LVL3 (GOWN DISPOSABLE) ×4 IMPLANT
INST SET MINOR BONE (KITS) ×2 IMPLANT
K-WIRE 1.25 TRCR POINT 150 (WIRE) ×2
KIT ROOM TURNOVER APOR (KITS) ×2 IMPLANT
KWIRE 1.25 TRCR POINT 150 (WIRE) IMPLANT
MANIFOLD NEPTUNE II (INSTRUMENTS) ×2 IMPLANT
NDL HYPO 21X1.5 SAFETY (NEEDLE) ×1 IMPLANT
NEEDLE HYPO 21X1.5 SAFETY (NEEDLE) ×2 IMPLANT
NS IRRIG 1000ML POUR BTL (IV SOLUTION) ×3 IMPLANT
PACK BASIC LIMB (CUSTOM PROCEDURE TRAY) ×3 IMPLANT
PAD ARMBOARD 7.5X6 YLW CONV (MISCELLANEOUS) ×2 IMPLANT
PROS LCP PLATE 6H 85MM (Plate) ×2 IMPLANT
PROS LCP PLATE 9H 124M (Plate) ×2 IMPLANT
PROSTHESIS LCP PLATE 6H 85MM (Plate) IMPLANT
PROSTHESIS LCP PLATE 9H 124M (Plate) IMPLANT
SCREW CORTEX 3.5 16MM (Screw) ×6 IMPLANT
SCREW CORTEX 3.5 18MM (Screw) ×5 IMPLANT
SCREW LOCK CORT ST 3.5X16 (Screw) IMPLANT
SCREW LOCK CORT ST 3.5X18 (Screw) IMPLANT
SET BASIN LINEN APH (SET/KITS/TRAYS/PACK) ×2 IMPLANT
SPLINT IMMOBILIZER J 3INX20FT (CAST SUPPLIES) ×1
SPLINT J IMMOBILIZER 3X20FT (CAST SUPPLIES) ×1 IMPLANT
SPONGE GAUZE 4X4 12PLY (GAUZE/BANDAGES/DRESSINGS) ×3 IMPLANT
STAPLER VISISTAT 35W (STAPLE) ×3 IMPLANT
SUT ETHILON 3 0 FSL (SUTURE) IMPLANT
SUT MON AB 2-0 SH 27 (SUTURE) ×4
SUT MON AB 2-0 SH27 (SUTURE) ×1 IMPLANT
SYR 30ML LL (SYRINGE) ×1 IMPLANT
SYR CONTROL 10ML LL (SYRINGE) ×2 IMPLANT
WATER STERILE IRR 1000ML POUR (IV SOLUTION) ×2 IMPLANT
kwire ×1 IMPLANT

## 2013-12-28 NOTE — Progress Notes (Signed)
Patient ID: Jonathan Hudson, male   DOB: 08-04-1966, 48 y.o.   MRN: [id number]  Discussed with Dr Roxanne Mins   BBFA fracture displaced   Admit   Surgery Wed

## 2013-12-28 NOTE — H&P (Signed)
Jonathan Hudson is an 48 y.o. male.   Chief Complaint: Right forearm pain  HPI: 48 year-old male with hypertension mild hypercholesterolemia fell on February 25 going up some steps landing on his right forearm sustaining a displaced closed both bone forearm fracture. Presented to the ER with pain swelling deformity and intact neurovascular function    Past Medical History  Diagnosis Date  . Hypercholesteremia     History reviewed. No pertinent past surgical history.  History reviewed. No pertinent family history. Social History:  reports that he has never smoked. He does not have any smokeless tobacco history on file. He reports that he drinks alcohol. He reports that he does not use illicit drugs.  Allergies: No Known Allergies   (Not in a hospital admission)  Results for orders placed during the hospital encounter of 12/28/13 (from the past 48 hour(s))  CBC WITH DIFFERENTIAL     Status: Abnormal   Collection Time    12/28/13  1:52 AM      Result Value Ref Range   WBC 15.0 (*) 4.0 - 10.5 K/uL   RBC 4.69  4.22 - 5.81 MIL/uL   Hemoglobin 15.0  13.0 - 17.0 g/dL   HCT 43.2  39.0 - 52.0 %   MCV 92.1  78.0 - 100.0 fL   MCH 32.0  26.0 - 34.0 pg   MCHC 34.7  30.0 - 36.0 g/dL   RDW 13.9  11.5 - 15.5 %   Platelets 238  150 - 400 K/uL   Neutrophils Relative % 46  43 - 77 %   Lymphocytes Relative 44  12 - 46 %   Monocytes Relative 7  3 - 12 %   Eosinophils Relative 3  0 - 5 %   Basophils Relative 0  0 - 1 %   Neutro Abs 6.8  1.7 - 7.7 K/uL   Lymphs Abs 6.6 (*) 0.7 - 4.0 K/uL   Monocytes Absolute 1.1 (*) 0.1 - 1.0 K/uL   Eosinophils Absolute 0.5  0.0 - 0.7 K/uL   Basophils Absolute 0.0  0.0 - 0.1 K/uL   WBC Morphology WHITE COUNT CONFIRMED ON SMEAR     Smear Review PENDING PATHOLOGIST REVIEW    BASIC METABOLIC PANEL     Status: Abnormal   Collection Time    12/28/13  1:52 AM      Result Value Ref Range   Sodium 141  137 - 147 mEq/L   Potassium 3.5 (*) 3.7 - 5.3 mEq/L    Chloride 103  96 - 112 mEq/L   CO2 24  19 - 32 mEq/L   Glucose, Bld 146 (*) 70 - 99 mg/dL   BUN 17  6 - 23 mg/dL   Creatinine, Ser 1.14  0.50 - 1.35 mg/dL   Calcium 8.9  8.4 - 10.5 mg/dL   GFR calc non Af Amer 75 (*) >90 mL/min   GFR calc Af Amer 87 (*) >90 mL/min   Comment: (NOTE)     The eGFR has been calculated using the CKD EPI equation.     This calculation has not been validated in all clinical situations.     eGFR's persistently <90 mL/min signify possible Chronic Kidney     Disease.   Dg Forearm Right  12/28/2013   CLINICAL DATA:  Fall down stairs.  EXAM: RIGHT FOREARM - 2 VIEW  COMPARISON:  None available for comparison at time of study interpretation.  FINDINGS: Oblique distal radius and ulnar diaphyseal fractures with  dorsal angulated fracture apex, overriding riding bony fragment. No dislocation. No destructive bony lesions. Soft tissue planes are nonsuspicious.  IMPRESSION: Distal radius and ulnar diaphyseal angulated fractures with impaction. No dislocation.   Electronically Signed   By: Elon Alas   On: 12/28/2013 02:41    ROS normal review of systems  Blood pressure 118/65, pulse 72, temperature 97.6 F (36.4 C), temperature source Oral, resp. rate 18, height $RemoveBe'6\' 2"'ZtMlsSVDM$  (1.88 m), weight 250 lb (113.399 kg), SpO2 98.00%. Physical Exam   General and hygiene are normal. Development and nutrition are normal. Body habitus normal Mood Affect are normal The patient is alert and oriented x3 Ambulatory status normal  RUE (include skin) Inspection reveals a splint in place with the fingers and hand available for examination. Capillary refill is normal all flexor and extensor tendons are intact. Splint is in place without excessive swelling. Right shoulder normal. LUE Inspection reveals no tenderness or swelling,  range of motion is normal. The joints are located without subluxation or laxity. Motor exam reveals grade 5 strength;  skin is without rash lesion or  ulcer  RLE Inspection reveals no tenderness or swelling;  range of motion is normal. The joints are located without subluxation or laxity. Motor exam reveals grade 5 strength and the skin is without rash lesion or ulcer  LLE Inspection reveals no tenderness or swelling,  range of motion is normal. The joints are located without subluxation or laxity. Motor exam reveals grade 5 strength and  the skin is without rash lesion or ulcer  CDV peripheral pulses are intact without swelling or varicose veins  LYMPH are normal in all 4 extremities with no palpable nodes  DTR are equal and symmetric Balance  is normal     Assessment/Plan Closed fracture right radius and ulna/both bone forearm fracture  Recommend open reduction internal fixation.  The risks and benefits of the surgery have been explained. Excepted treatment is internal fixation. Complications include but are not limited to neurovascular injury. Stiffness. Infection.  The patient is demonstrated understanding of these risks and benefits. Alternative treatment is not an acceptable treatment in this clinical setting.  Arther Abbott 12/28/2013, 3:28 AM

## 2013-12-28 NOTE — ED Notes (Signed)
Patient presents s/p fall.  Patient was falling backward and landed on right arm.  Obvious deformity noted to forearm.

## 2013-12-28 NOTE — Anesthesia Procedure Notes (Signed)
Procedure Name: Intubation Date/Time: 12/28/2013 1:13 PM Performed by: Andree Elk, Edison Wollschlager A Pre-anesthesia Checklist: Patient identified, Patient being monitored, Timeout performed, Emergency Drugs available and Suction available Patient Re-evaluated:Patient Re-evaluated prior to inductionOxygen Delivery Method: Circle System Utilized Preoxygenation: Pre-oxygenation with 100% oxygen Intubation Type: IV induction Ventilation: Mask ventilation without difficulty Laryngoscope Size: 3 and Miller Grade View: Grade I Tube type: Oral Tube size: 7.0 mm Number of attempts: 1 Airway Equipment and Method: stylet Placement Confirmation: ETT inserted through vocal cords under direct vision,  positive ETCO2 and breath sounds checked- equal and bilateral Secured at: 21 cm Tube secured with: Tape Dental Injury: Teeth and Oropharynx as per pre-operative assessment

## 2013-12-28 NOTE — ED Provider Notes (Signed)
CSN: [id number]     Arrival date & time 12/28/13  0137 History   First MD Initiated Contact with Patient 12/28/13 0144     Chief Complaint  Patient presents with  . Arm Injury     (Consider location/radiation/quality/duration/timing/severity/associated sxs/prior Treatment) Patient is a 48 y.o. male presenting with arm injury. The history is provided by the patient.  Arm Injury He slipped on some ice while going up steps and injured his right arm. He denies other injury. Pain is severe and he rates at 10/10. He denies numbness or tingling. Pain is worse with any movement or with palpation. Nothing makes it better.  Past Medical History  Diagnosis Date  . Hypercholesteremia    History reviewed. No pertinent past surgical history. No family history on file. History  Substance Use Topics  . Smoking status: Never Smoker   . Smokeless tobacco: Not on file  . Alcohol Use: Yes     Comment: Occ    Review of Systems  All other systems reviewed and are negative.      Allergies  Review of patient's allergies indicates no known allergies.  Home Medications   Current Outpatient Rx  Name  Route  Sig  Dispense  Refill  . atorvastatin (LIPITOR) 40 MG tablet   Oral   Take 20 mg by mouth daily.         Marland Kitchen HYDROcodone-acetaminophen (NORCO/VICODIN) 5-325 MG per tablet   Oral   Take 1 tablet by mouth every 4 (four) hours as needed for pain.   20 tablet   0   . ibuprofen (ADVIL,MOTRIN) 800 MG tablet   Oral   Take 800 mg by mouth every 8 (eight) hours as needed for pain.          BP 115/70  Pulse 74  Temp(Src) 97.6 F (36.4 C) (Oral)  Resp 18  Ht 6\' 2"  (1.88 m)  Wt 250 lb (113.399 kg)  BMI 32.08 kg/m2  SpO2 100% Physical Exam  Nursing note and vitals reviewed.  48 year old male, who is obviously in pain, but his in no acute distress. Vital signs are normal. Oxygen saturation is 100%, which is normal. Head is normocephalic and atraumatic. PERRLA, EOMI. Oropharynx is  clear. Neck is nontender and supple without adenopathy or JVD. Back is nontender and there is no CVA tenderness. Lungs are clear without rales, wheezes, or rhonchi. Chest is nontender. Heart has regular rate and rhythm without murmur. Abdomen is soft, flat, nontender without masses or hepatosplenomegaly and peristalsis is normoactive. Extremities: Right arm has swelling from just distal to the elbow to approximately 5 cm proximal to the wrist. There is an abrasion over this entire area as well. Impression actually extends to just above the elbow. There is marked tenderness throughout this area. Distal neurovascular exam is intact with strong pulses, prompt capillary refill, normal sensation, and normal motor function. Remainder of extremity exam is normal. Skin is warm and dry without rash. Neurologic: Mental status is normal, cranial nerves are intact, there are no motor or sensory deficits.  ED Course  SPLINT APPLICATION Date/Time: 3/76/2831 3:50 AM Performed by: Delora Fuel Authorized by: Roxanne Mins, Sneijder Bernards Consent: Verbal consent obtained. written consent not obtained. Risks and benefits: risks, benefits and alternatives were discussed Consent given by: patient Patient understanding: patient states understanding of the procedure being performed Patient consent: the patient's understanding of the procedure matches consent given Procedure consent: procedure consent matches procedure scheduled Relevant documents: relevant documents present and verified Test  results: test results available and properly labeled Site marked: the operative site was marked Imaging studies: imaging studies available Required items: required blood products, implants, devices, and special equipment available Patient identity confirmed: verbally with patient and arm band Time out: Immediately prior to procedure a "time out" was called to verify the correct patient, procedure, equipment, support staff and site/side  marked as required. Location details: right arm Splint type: sugar tong Supplies used: cotton padding,  elastic bandage and Ortho-Glass Post-procedure: The splinted body part was neurovascularly unchanged following the procedure. Patient tolerance: Patient tolerated the procedure well with no immediate complications.   (including critical care time) Labs Review Results for orders placed during the hospital encounter of 12/28/13  CBC WITH DIFFERENTIAL      Result Value Ref Range   WBC 15.0 (*) 4.0 - 10.5 K/uL   RBC 4.69  4.22 - 5.81 MIL/uL   Hemoglobin 15.0  13.0 - 17.0 g/dL   HCT 43.2  39.0 - 52.0 %   MCV 92.1  78.0 - 100.0 fL   MCH 32.0  26.0 - 34.0 pg   MCHC 34.7  30.0 - 36.0 g/dL   RDW 13.9  11.5 - 15.5 %   Platelets 238  150 - 400 K/uL   Neutrophils Relative % 46  43 - 77 %   Lymphocytes Relative 44  12 - 46 %   Monocytes Relative 7  3 - 12 %   Eosinophils Relative 3  0 - 5 %   Basophils Relative 0  0 - 1 %   Neutro Abs 6.8  1.7 - 7.7 K/uL   Lymphs Abs 6.6 (*) 0.7 - 4.0 K/uL   Monocytes Absolute 1.1 (*) 0.1 - 1.0 K/uL   Eosinophils Absolute 0.5  0.0 - 0.7 K/uL   Basophils Absolute 0.0  0.0 - 0.1 K/uL   WBC Morphology WHITE COUNT CONFIRMED ON SMEAR     Smear Review PENDING PATHOLOGIST REVIEW    BASIC METABOLIC PANEL      Result Value Ref Range   Sodium 141  137 - 147 mEq/L   Potassium 3.5 (*) 3.7 - 5.3 mEq/L   Chloride 103  96 - 112 mEq/L   CO2 24  19 - 32 mEq/L   Glucose, Bld 146 (*) 70 - 99 mg/dL   BUN 17  6 - 23 mg/dL   Creatinine, Ser 1.14  0.50 - 1.35 mg/dL   Calcium 8.9  8.4 - 10.5 mg/dL   GFR calc non Af Amer 75 (*) >90 mL/min   GFR calc Af Amer 87 (*) >90 mL/min   Imaging Review Dg Forearm Right  12/28/2013   CLINICAL DATA:  Fall down stairs.  EXAM: RIGHT FOREARM - 2 VIEW  COMPARISON:  None available for comparison at time of study interpretation.  FINDINGS: Oblique distal radius and ulnar diaphyseal fractures with dorsal angulated fracture apex, overriding  riding bony fragment. No dislocation. No destructive bony lesions. Soft tissue planes are nonsuspicious.  IMPRESSION: Distal radius and ulnar diaphyseal angulated fractures with impaction. No dislocation.   Electronically Signed   By: Elon Alas   On: 12/28/2013 02:41   Images viewed by me.  MDM   Final diagnoses:  Fall on steps  Closed fracture of shaft of right radius and ulna    Right arm injury. He he is sent for x-ray and is given hydromorphone for pain.   X-ray confirms fracture of the shaft of both radius and Alma. Case is discussed with  Dr. Aline Brochure he wishes the patient to be admitted for surgical management and he requests sugar tong splint be applied. This is done with significant improvement in pain control. Screening labs are obtained showing mild hypokalemia and will be given potassium intravenously.  Delora Fuel, MD [id number]

## 2013-12-28 NOTE — Preoperative (Signed)
Beta Blockers   Reason not to administer Beta Blockers:Not Applicable 

## 2013-12-28 NOTE — Op Note (Signed)
12/28/2013  3:30 PM  PATIENT:  Jonathan Hudson  48 y.o. male  PRE-OPERATIVE DIAGNOSIS:  closed right both bone forearm fracture  POST-OPERATIVE DIAGNOSIS:  closed right both bone forearm fracture  PROCEDURE:  Procedure(s): OPEN REDUCTION INTERNAL FIXATION (ORIF) ULNAR FRACTURE (Right) OPEN REDUCTION INTERNAL FIXATION (ORIF) DISTAL RADIAL FRACTURE (Right)  Implant Synthes LC-DCP plate 9 hole plate on the ulna 6-hole plate on the radius a total of 15 screws  Operative findings comminuted ulnar fracture with butterfly fragment transverse radius fracture both midshaft to slightly distal third   SURGEON:  Surgeon(s) and Role:    * Carole Civil, MD - Primary  PHYSICIAN ASSISTANT:   ASSISTANTS: Corrie Dandy   ANESTHESIA:   general  EBL:  Total I/O In: 1000 [I.V.:1000] Out: 325 [Urine:300; Blood:25]  BLOOD ADMINISTERED:none  DRAINS: none   LOCAL MEDICATIONS USED:  MARCAINE     SPECIMEN:  No Specimen  DISPOSITION OF SPECIMEN:  No specimen  COUNTS:  YES  TOURNIQUET:   Total Tourniquet Time Documented: Upper Arm (Right) - 113 minutes Total: Upper Arm (Right) - 113 minutes   DICTATION: .Viviann Spare Dictation  PLAN OF CARE: Admit to inpatient   PATIENT DISPOSITION:  PACU - hemodynamically stable.   Delay start of Pharmacological VTE agent (>24hrs) due to surgical blood loss or risk of bleeding: yes  Operative technique Site marking chart update completed and preop  Patient taken to surgery for general anesthesia. In the supine position the right arm was prepped and draped sterilely with a tourniquet applied proximally after timeout procedure skin markings were made for the 2 volar incisions the 7 mm skin bridge in between  We first addressed the radius we did a volar Henry approach we protected the radial artery. We did subperiosteal dissection clean the fracture manipulated into position and held with a bone clamp  We then approached the ulna we made a volar  incision on the volar side of the ulna we did this incision through subcutaneous tissue we found the fracture we exposed bone and found a comminuted fracture with a large butterfly fragment which was not appreciated on x-ray  Reduce this fracture and held with a bone clamp and took a radiograph and found that both fractures were able to be reduced  We then turned our attention back to the radius and applied 2 screws one on each side of the fracture with a 6-hole plate. We then turned our attention back to the ulna. We had difficulty reducing the ulnar fracture butterfly fragment and maintaining length. After several attempts we pinned the butterfly fragment in place and then applied the plate to one side of the fracture and then reduce to bone to the plate. We were unable to get 6 cortices on the distal portion of the ulna.  We were able to get 6 cortices proximally  Radiographically in AP and lateral plane which showed anatomic reduction. We then flexed and extended the elbow we were able to obtain full range of motion including pronation supination  We then irrigated the wounds and closed with 2-0 Monocryl and staples. We did 2 blocks proximal to the wound with 15 cc of plain Marcaine proximal to the incision  We placed sterile dressings and a volar splint  Extubation was performed and the patient was taken recovery in stable condition

## 2013-12-28 NOTE — Anesthesia Preprocedure Evaluation (Signed)
Anesthesia Evaluation  Patient identified by MRN, date of birth, ID band Patient awake    Reviewed: Allergy & Precautions, H&P , NPO status , Patient's Chart, lab work & pertinent test results  Airway Mallampati: II TM Distance: >3 FB Neck ROM: Full    Dental  (+) Teeth Intact, Missing   Pulmonary neg pulmonary ROS,  breath sounds clear to auscultation        Cardiovascular hypertension, Pt. on medications Rhythm:Regular Rate:Normal     Neuro/Psych    GI/Hepatic negative GI ROS,   Endo/Other    Renal/GU      Musculoskeletal   Abdominal   Peds  Hematology   Anesthesia Other Findings   Reproductive/Obstetrics                           Anesthesia Physical Anesthesia Plan  ASA: II  Anesthesia Plan: General   Post-op Pain Management:    Induction: Intravenous  Airway Management Planned: Oral ETT  Additional Equipment:   Intra-op Plan:   Post-operative Plan: Extubation in OR  Informed Consent: I have reviewed the patients History and Physical, chart, labs and discussed the procedure including the risks, benefits and alternatives for the proposed anesthesia with the patient or authorized representative who has indicated his/her understanding and acceptance.     Plan Discussed with:   Anesthesia Plan Comments:         Anesthesia Quick Evaluation

## 2013-12-28 NOTE — Brief Op Note (Signed)
12/28/2013  3:30 PM  PATIENT:  Jonathan Hudson  48 y.o. male  PRE-OPERATIVE DIAGNOSIS:  closed right both bone forearm fracture  POST-OPERATIVE DIAGNOSIS:  closed right both bone forearm fracture  PROCEDURE:  Procedure(s): OPEN REDUCTION INTERNAL FIXATION (ORIF) ULNAR FRACTURE (Right) OPEN REDUCTION INTERNAL FIXATION (ORIF) DISTAL RADIAL FRACTURE (Right)  Implant Synthes LC-DCP plate 9 hole plate on the ulna 6-hole plate on the radius a total of 15 screws  Operative findings comminuted ulnar fracture with butterfly fragment transverse radius fracture both midshaft to slightly distal third   SURGEON:  Surgeon(s) and Role:    * Clarence Dunsmore E Kaleea Penner, MD - Primary  PHYSICIAN ASSISTANT:   ASSISTANTS: Debbie Dallas   ANESTHESIA:   general  EBL:  Total I/O In: 1000 [I.V.:1000] Out: 325 [Urine:300; Blood:25]  BLOOD ADMINISTERED:none  DRAINS: none   LOCAL MEDICATIONS USED:  MARCAINE     SPECIMEN:  No Specimen  DISPOSITION OF SPECIMEN:  No specimen  COUNTS:  YES  TOURNIQUET:   Total Tourniquet Time Documented: Upper Arm (Right) - 113 minutes Total: Upper Arm (Right) - 113 minutes   DICTATION: .Dragon Dictation  PLAN OF CARE: Admit to inpatient   PATIENT DISPOSITION:  PACU - hemodynamically stable.   Delay start of Pharmacological VTE agent (>24hrs) due to surgical blood loss or risk of bleeding: yes  Operative technique Site marking chart update completed and preop  Patient taken to surgery for general anesthesia. In the supine position the right arm was prepped and draped sterilely with a tourniquet applied proximally after timeout procedure skin markings were made for the 2 volar incisions the 7 mm skin bridge in between  We first addressed the radius we did a volar Henry approach we protected the radial artery. We did subperiosteal dissection clean the fracture manipulated into position and held with a bone clamp  We then approached the ulna we made a volar  incision on the volar side of the ulna we did this incision through subcutaneous tissue we found the fracture we exposed bone and found a comminuted fracture with a large butterfly fragment which was not appreciated on x-ray  Reduce this fracture and held with a bone clamp and took a radiograph and found that both fractures were able to be reduced  We then turned our attention back to the radius and applied 2 screws one on each side of the fracture with a 6-hole plate. We then turned our attention back to the ulna. We had difficulty reducing the ulnar fracture butterfly fragment and maintaining length. After several attempts we pinned the butterfly fragment in place and then applied the plate to one side of the fracture and then reduce to bone to the plate. We were unable to get 6 cortices on the distal portion of the ulna.  We were able to get 6 cortices proximally  Radiographically in AP and lateral plane which showed anatomic reduction. We then flexed and extended the elbow we were able to obtain full range of motion including pronation supination  We then irrigated the wounds and closed with 2-0 Monocryl and staples. We did 2 blocks proximal to the wound with 15 cc of plain Marcaine proximal to the incision  We placed sterile dressings and a volar splint  Extubation was performed and the patient was taken recovery in stable condition  

## 2013-12-28 NOTE — Anesthesia Postprocedure Evaluation (Signed)
  Anesthesia Post-op Note  Patient: Jonathan Hudson  Procedure(s) Performed: Procedure(s): OPEN REDUCTION INTERNAL FIXATION (ORIF) ULNAR FRACTURE (Right) OPEN REDUCTION INTERNAL FIXATION (ORIF) DISTAL RADIAL FRACTURE (Right)  Patient Location: PACU  Anesthesia Type:General  Level of Consciousness: sedated and patient cooperative  Airway and Oxygen Therapy: Patient Spontanous Breathing and Patient connected to face mask oxygen  Post-op Pain: moderate  Post-op Assessment: Post-op Vital signs reviewed, Patient's Cardiovascular Status Stable, Respiratory Function Stable, Patent Airway, No signs of Nausea or vomiting and Pain level controlled  Post-op Vital Signs: Reviewed and stable  Complications: No apparent anesthesia complications

## 2013-12-28 NOTE — Transfer of Care (Signed)
Immediate Anesthesia Transfer of Care Note  Patient: Jonathan Hudson  Procedure(s) Performed: Procedure(s): OPEN REDUCTION INTERNAL FIXATION (ORIF) ULNAR FRACTURE (Right) OPEN REDUCTION INTERNAL FIXATION (ORIF) DISTAL RADIAL FRACTURE (Right)  Patient Location: PACU  Anesthesia Type:General  Level of Consciousness: sedated and patient cooperative  Airway & Oxygen Therapy: Patient Spontanous Breathing and Patient connected to face mask oxygen  Post-op Assessment: Report given to PACU RN and Post -op Vital signs reviewed and stable  Post vital signs: Reviewed and stable  Complications: No apparent anesthesia complications

## 2013-12-28 NOTE — Progress Notes (Signed)
UR Completed.  

## 2013-12-29 LAB — PATHOLOGIST SMEAR REVIEW

## 2013-12-29 MED ORDER — IBUPROFEN 800 MG PO TABS: 800.0000 mg | ORAL_TABLET | Freq: Three times a day (TID) | ORAL | Status: DC | PRN

## 2013-12-29 MED ORDER — OXYCODONE-ACETAMINOPHEN 5-325 MG PO TABS: 1.0000 | ORAL_TABLET | ORAL | Status: DC | PRN

## 2013-12-29 MED ORDER — PROMETHAZINE HCL 12.5 MG PO TABS: 12.5000 mg | ORAL_TABLET | Freq: Four times a day (QID) | ORAL | Status: DC | PRN

## 2013-12-29 NOTE — Discharge Summary (Signed)
Physician Discharge Summary  Patient ID: Jonathan Hudson MRN: [id number] DOB/AGE: [birth year] 48 y.o.  Admit date: 12/28/2013 Discharge date: 12/29/2013  Admission Diagnoses: closed fracture right radius and ulna (both bones forearm) Discharge Diagnoses: same  Active Problems:   Closed fracture of shaft of right radius and ulna   Forearm fracture   Fx radius/ulna shaft-closed   Discharged Condition: [facility]: admitted on 2/25 ~ 3 am had sugrery 2/25 and discharged 2/26   Consults: None   Treatments: surgery: OTIF RIGHT FOREARM SYNTHES 3.5 PLATES   Discharge Exam: Blood pressure 141/74, pulse 54, temperature 98.6 F (37 C), temperature source Oral, resp. rate 18, height 6\' 2"  (1.88 m), weight 245 lb 6 oz (111.3 kg), SpO2 99.00%. General appearance: alert and cooperative MOVING FINGERS AND THUMB, CAPILLARY REFILL IS NORMAL SENSATION IN FINGERS IS NORMAL   Disposition: 01-Home or Self Care  Discharge Orders   Future Orders Complete By Expires   Call MD for:  hives  As directed    Call MD for:  persistant nausea and vomiting  As directed    Call MD for:  severe uncontrolled pain  As directed    Diet - low sodium heart healthy  As directed    Discharge instructions  As directed    Comments:     Keep splint clean and dry  Move fingers every 2 hours   Driving Restrictions  As directed    Comments:     No driving x 2 weeks   Increase activity slowly  As directed        Medication List         atorvastatin 40 MG tablet  Commonly known as:  LIPITOR  Take 40 mg by mouth daily.     ibuprofen 800 MG tablet  Commonly known as:  ADVIL,MOTRIN  Take 1 tablet (800 mg total) by mouth every 8 (eight) hours as needed.     lisinopril 5 MG tablet  Commonly known as:  PRINIVIL,ZESTRIL  Take 5 mg by mouth daily.     oxyCODONE-acetaminophen 5-325 MG per tablet  Commonly known as:  ROXICET  Take 1 tablet by mouth every 4 (four) hours as needed for severe pain.      promethazine 12.5 MG tablet  Commonly known as:  PHENERGAN  Take 1 tablet (12.5 mg total) by mouth every 6 (six) hours as needed for nausea or vomiting.         Signed: Arther Abbott 12/29/2013, 10:07 AM

## 2014-01-02 ENCOUNTER — Encounter (HOSPITAL_COMMUNITY): Payer: Self-pay | Admitting: Orthopedic Surgery

## 2014-01-03 ENCOUNTER — Encounter: Payer: Self-pay | Admitting: Orthopedic Surgery

## 2014-01-03 ENCOUNTER — Ambulatory Visit (INDEPENDENT_AMBULATORY_CARE_PROVIDER_SITE_OTHER): Payer: Managed Care, Other (non HMO) | Admitting: Orthopedic Surgery

## 2014-01-03 VITALS — BP 141/79 | Ht 74.0 in | Wt 245.0 lb

## 2014-01-03 DIAGNOSIS — S52209A Unspecified fracture of shaft of unspecified ulna, initial encounter for closed fracture: Secondary | ICD-10-CM

## 2014-01-03 DIAGNOSIS — S5290XA Unspecified fracture of unspecified forearm, initial encounter for closed fracture: Secondary | ICD-10-CM

## 2014-01-03 MED ORDER — OXYCODONE-ACETAMINOPHEN 5-325 MG PO TABS: 1.0000 | ORAL_TABLET | ORAL | Status: DC | PRN

## 2014-01-03 NOTE — Progress Notes (Signed)
Patient ID: Jonathan Hudson, male   DOB: [birth year], 48 y.o.   MRN: [id number]  Chief Complaint  Patient presents with  . Follow-up    Post op #1 ORIF Right ulnar fracture DOS 12/28/13    BP 141/79  Ht 6\' 2"  (1.88 m)  Wt 245 lb (111.131 kg)  BMI 31.44 kg/m2  Encounter Diagnoses  Name Primary?  Marland Kitchen Ulnar fracture Yes  . Fx radius/ulna shaft-closed     Recheck postop visit #1 change splint wounds look clean dressings reapplied patient has good neurovascular function.  Recommend repeat x-ray at postop day #14 remove sutures or staples at that time

## 2014-01-03 NOTE — Patient Instructions (Signed)
Keep splint dry

## 2014-01-09 ENCOUNTER — Telehealth: Payer: Self-pay | Admitting: Orthopedic Surgery

## 2014-01-09 NOTE — Telephone Encounter (Signed)
Office notes, hospital reports faxed to employer regarding short-term disability, to attention: Seth Bake, fax# as noted

## 2014-01-11 ENCOUNTER — Ambulatory Visit (INDEPENDENT_AMBULATORY_CARE_PROVIDER_SITE_OTHER): Payer: Managed Care, Other (non HMO)

## 2014-01-11 ENCOUNTER — Ambulatory Visit (INDEPENDENT_AMBULATORY_CARE_PROVIDER_SITE_OTHER): Payer: Managed Care, Other (non HMO) | Admitting: Orthopedic Surgery

## 2014-01-11 VITALS — Ht 74.0 in | Wt 245.0 lb

## 2014-01-11 DIAGNOSIS — S5291XA Unspecified fracture of right forearm, initial encounter for closed fracture: Secondary | ICD-10-CM

## 2014-01-11 DIAGNOSIS — S5290XA Unspecified fracture of unspecified forearm, initial encounter for closed fracture: Secondary | ICD-10-CM

## 2014-01-11 NOTE — Progress Notes (Signed)
Patient ID: Jonathan Hudson, male   DOB: 25-Mar-1966, 48 y.o.   MRN: [id number]  Chief Complaint  Patient presents with  . Follow-up    post op 2 staples out and cast application DOS 4/82/70    Ht 6\' 2"  (1.88 m)  Wt 245 lb (111.131 kg)  BMI 31.44 kg/m2  Encounter Diagnoses  Name Primary?  . Right forearm fracture Yes  . Fx radius/ulna shaft-closed     Followup postop visit.  Staples look good  Fixation looks excellent  Patient placed in short arm cast  Followup 4 weeks repeat x-ray in the cast

## 2014-01-11 NOTE — Patient Instructions (Signed)
Keep cast clean and dry

## 2014-01-12 ENCOUNTER — Encounter: Payer: Self-pay | Admitting: Orthopedic Surgery

## 2014-01-17 ENCOUNTER — Other Ambulatory Visit: Payer: Self-pay | Admitting: Orthopedic Surgery

## 2014-01-17 ENCOUNTER — Telehealth: Payer: Self-pay | Admitting: Orthopedic Surgery

## 2014-01-17 MED ORDER — HYDROCODONE-ACETAMINOPHEN 10-325 MG PO TABS: 1.0000 | ORAL_TABLET | ORAL | Status: DC | PRN

## 2014-01-17 NOTE — Telephone Encounter (Signed)
Refilled per Dr. Aline Brochure. Patient advised prescription ready to be picked up.

## 2014-01-17 NOTE — Telephone Encounter (Signed)
Routing to Dr Harrison 

## 2014-01-23 ENCOUNTER — Telehealth: Payer: Self-pay | Admitting: Orthopedic Surgery

## 2014-01-23 NOTE — Telephone Encounter (Signed)
Received call from patient 11:55am today, 01/23/14, relaying that his cast has been kept dry with tape, plastic bags, and most recently, with plastic "sleeves" ordered online.  He states that his cast has an odor coming from it, and also, a "tingling" sensation is noticeable.  I relayed that I would be glad to schedule him to come to the office, although tomorrow, 01/24/14, is the first available, as Dr Aline Brochure had already left for remainder of day.  Patient is scheduled for tomorrow, 1:30pm - please call back with any other advice at his home ph# [phone].

## 2014-01-23 NOTE — Telephone Encounter (Signed)
Noted no other advice.

## 2014-01-24 ENCOUNTER — Ambulatory Visit (INDEPENDENT_AMBULATORY_CARE_PROVIDER_SITE_OTHER): Payer: Managed Care, Other (non HMO) | Admitting: Orthopedic Surgery

## 2014-01-24 VITALS — BP 123/86 | Ht 74.0 in | Wt 245.0 lb

## 2014-01-24 DIAGNOSIS — S5290XA Unspecified fracture of unspecified forearm, initial encounter for closed fracture: Secondary | ICD-10-CM | POA: Insufficient documentation

## 2014-01-24 DIAGNOSIS — T8131XA Disruption of external operation (surgical) wound, not elsewhere classified, initial encounter: Secondary | ICD-10-CM

## 2014-01-24 MED ORDER — SULFAMETHOXAZOLE-TMP DS 800-160 MG PO TABS: 1.0000 | ORAL_TABLET | Freq: Two times a day (BID) | ORAL | Status: DC

## 2014-01-24 NOTE — Progress Notes (Signed)
Patient ID: Jonathan Hudson, male   DOB: 10-Jan-1966, 48 y.o.   MRN: [id number]  Chief Complaint  Patient presents with  . Cast Problem    Feels some moisture down inside, has odor DOS 12/28/13    Status post both bone forearm fracture open treatment internal fixation with plating. Sutures were taken out on 2 weeks postop  Two-week history of moisture inside the cast  Wound breakdown especially at the distal end. No drainage.  Mild tenderness no neurovascular problems range of motion in the hand and finger still normal  The wound was cleaned with peroxide wet to dry dressing was placed a splint was placed  Start antibiotics with Bactrim twice a day return in a week for reevaluation of the wound.

## 2014-01-24 NOTE — Patient Instructions (Signed)
Take antibiotics   Continue hand exercises

## 2014-01-25 ENCOUNTER — Telehealth: Payer: Self-pay | Admitting: *Deleted

## 2014-01-25 NOTE — Telephone Encounter (Signed)
Call received from patient that the Bactrim DS 800-160 mg he was prescribed yesterday is making him vomit. He has taken 3 pills, and has vomited every time.Patient uses [facility] Please advise. Patient's number [phone]

## 2014-01-26 ENCOUNTER — Other Ambulatory Visit: Payer: Self-pay | Admitting: Orthopedic Surgery

## 2014-01-26 DIAGNOSIS — T8131XA Disruption of external operation (surgical) wound, not elsewhere classified, initial encounter: Secondary | ICD-10-CM

## 2014-01-26 DIAGNOSIS — S52201A Unspecified fracture of shaft of right ulna, initial encounter for closed fracture: Secondary | ICD-10-CM

## 2014-01-26 DIAGNOSIS — S52301A Unspecified fracture of shaft of right radius, initial encounter for closed fracture: Principal | ICD-10-CM

## 2014-01-26 MED ORDER — DOXYCYCLINE HYCLATE 100 MG PO TABS: 100.0000 mg | ORAL_TABLET | Freq: Two times a day (BID) | ORAL | Status: DC

## 2014-01-26 NOTE — Telephone Encounter (Signed)
What is the strength and directions? Please advise

## 2014-01-26 NOTE — Telephone Encounter (Signed)
Patient aware prescription is at pharmacy. He states he has already picked up his prescription.

## 2014-01-26 NOTE — Telephone Encounter (Signed)
Change to doxycycline

## 2014-01-26 NOTE — Telephone Encounter (Signed)
Have him pick up from pharmacy

## 2014-01-31 ENCOUNTER — Encounter: Payer: Self-pay | Admitting: Orthopedic Surgery

## 2014-01-31 ENCOUNTER — Ambulatory Visit (INDEPENDENT_AMBULATORY_CARE_PROVIDER_SITE_OTHER): Payer: Managed Care, Other (non HMO) | Admitting: Orthopedic Surgery

## 2014-01-31 ENCOUNTER — Ambulatory Visit (INDEPENDENT_AMBULATORY_CARE_PROVIDER_SITE_OTHER): Payer: Managed Care, Other (non HMO)

## 2014-01-31 VITALS — BP 140/94 | Ht 74.0 in | Wt 245.0 lb

## 2014-01-31 DIAGNOSIS — S5290XA Unspecified fracture of unspecified forearm, initial encounter for closed fracture: Secondary | ICD-10-CM

## 2014-01-31 DIAGNOSIS — T8131XA Disruption of external operation (surgical) wound, not elsewhere classified, initial encounter: Secondary | ICD-10-CM

## 2014-01-31 NOTE — Progress Notes (Signed)
Patient ID: Jonathan Hudson, male   DOB: October 13, 1966, 48 y.o.   MRN: [id number]  Chief Complaint  Patient presents with  . Wound Check    one week wound check DOS 12/28/13    Encounter Diagnoses  Name Primary?  . Fracture of forearm Yes  . Dehiscence of operative wound     Both bone forearm fracture right arm uncomplicated by dehiscence of the ulnar fracture wound. X-rays show no compromise in the fixation  The wound is improving there is approximately one and a half centimeter portion of the distal wound which is still granulating. Change wet-to-dry dressing recommend continue antibiotics. Doxycycline 100 mg twice a day  The patient is placed in a removable forearm splint encouraged to continue hand exercises and followup in 2 days to change the dressing

## 2014-01-31 NOTE — Patient Instructions (Signed)
Continue Antibiotics

## 2014-02-02 ENCOUNTER — Ambulatory Visit (INDEPENDENT_AMBULATORY_CARE_PROVIDER_SITE_OTHER): Payer: Self-pay | Admitting: Orthopedic Surgery

## 2014-02-02 VITALS — BP 135/91 | Ht 74.0 in | Wt 245.0 lb

## 2014-02-02 DIAGNOSIS — T8131XA Disruption of external operation (surgical) wound, not elsewhere classified, initial encounter: Secondary | ICD-10-CM

## 2014-02-02 DIAGNOSIS — S5290XA Unspecified fracture of unspecified forearm, initial encounter for closed fracture: Secondary | ICD-10-CM

## 2014-02-02 NOTE — Patient Instructions (Signed)
Dr. Aline Brochure will call Saturday 01/04/14 to set up time for dressing change

## 2014-02-02 NOTE — Progress Notes (Signed)
Patient ID: Jonathan Hudson, male   DOB: 1966-01-15, 48 y.o.   MRN: [id number]  Chief Complaint  Patient presents with  . Follow-up    2 day recheck on right forearm wound. DOS 12-28-13.    Dressing change status post open treatment internal fixation both bone forearm fracture. No odor today no drainage or redness exudate in the wound  Wet-to-dry dressing change again on Saturday

## 2014-02-04 ENCOUNTER — Encounter (INDEPENDENT_AMBULATORY_CARE_PROVIDER_SITE_OTHER): Payer: Self-pay | Admitting: Orthopedic Surgery

## 2014-02-04 DIAGNOSIS — T8131XA Disruption of external operation (surgical) wound, not elsewhere classified, initial encounter: Secondary | ICD-10-CM

## 2014-02-04 DIAGNOSIS — S5290XA Unspecified fracture of unspecified forearm, initial encounter for closed fracture: Secondary | ICD-10-CM

## 2014-02-04 NOTE — Progress Notes (Signed)
Patient ID: Jonathan Hudson, male   DOB: 1966/01/01, 48 y.o.   MRN: [id number]  BBFA fracture right  Wound dehiscence Doxycycline 100 mg po bid  norco for pain takes occasional  Not c/o pain   Dressing changes NACL WET TO DRY  IMPROVING GRANULATING WITH SOME FIBRINOUS EXUDATE   CHANGE AGAIN TUES AT 10

## 2014-02-07 ENCOUNTER — Ambulatory Visit (INDEPENDENT_AMBULATORY_CARE_PROVIDER_SITE_OTHER): Payer: Self-pay | Admitting: Orthopedic Surgery

## 2014-02-07 VITALS — BP 141/94 | Ht 74.0 in | Wt 245.0 lb

## 2014-02-07 DIAGNOSIS — T8131XA Disruption of external operation (surgical) wound, not elsewhere classified, initial encounter: Secondary | ICD-10-CM

## 2014-02-08 NOTE — Progress Notes (Signed)
Patient ID: Jonathan Hudson, male   DOB: Oct 04, 1966, 48 y.o.   MRN: [id number] Chief Complaint  Patient presents with  . Wound Check    Dressing change DOS 12/28/13   Wound clean  No drainage  No tenderness

## 2014-02-09 ENCOUNTER — Ambulatory Visit: Payer: Managed Care, Other (non HMO) | Admitting: Orthopedic Surgery

## 2014-02-13 ENCOUNTER — Encounter: Payer: Self-pay | Admitting: Orthopedic Surgery

## 2014-02-13 ENCOUNTER — Ambulatory Visit (INDEPENDENT_AMBULATORY_CARE_PROVIDER_SITE_OTHER): Payer: Self-pay | Admitting: Orthopedic Surgery

## 2014-02-13 VITALS — BP 124/86 | Ht 74.0 in | Wt 245.0 lb

## 2014-02-13 DIAGNOSIS — T8131XA Disruption of external operation (surgical) wound, not elsewhere classified, initial encounter: Secondary | ICD-10-CM

## 2014-02-13 NOTE — Progress Notes (Signed)
Patient came in 02/13/14.Wound was clean. No drainage noted, and dressing was changed. Follow up Wednesday 02/15/14.

## 2014-02-14 ENCOUNTER — Ambulatory Visit: Payer: Managed Care, Other (non HMO) | Admitting: Orthopedic Surgery

## 2014-02-15 ENCOUNTER — Telehealth: Payer: Self-pay | Admitting: Orthopedic Surgery

## 2014-02-15 ENCOUNTER — Ambulatory Visit (INDEPENDENT_AMBULATORY_CARE_PROVIDER_SITE_OTHER): Payer: Self-pay | Admitting: Orthopedic Surgery

## 2014-02-15 DIAGNOSIS — T8131XA Disruption of external operation (surgical) wound, not elsewhere classified, initial encounter: Secondary | ICD-10-CM

## 2014-02-15 NOTE — Progress Notes (Signed)
Patient came in 02/15/14 for dressing change. A wet to dry dressing was applied. Wound was clean, small amount of drainage noted on dressing that was removed. Patient is to come back Friday 02/17/14 for another dressing change, and he is scheduled to see Dr. Aline Brochure Monday 02/20/14 for dressing change and xray.

## 2014-02-15 NOTE — Telephone Encounter (Signed)
Notes, including out of work note, faxed to patient's employer, Attention Seth Bake, [employer], [place], for purpose of short-term disability, through date of service 02/13/14, to Fax# [phone].  Signed authorization on file.  Patient aware of status.

## 2014-02-17 ENCOUNTER — Encounter: Payer: Self-pay | Admitting: Orthopedic Surgery

## 2014-02-17 ENCOUNTER — Ambulatory Visit (INDEPENDENT_AMBULATORY_CARE_PROVIDER_SITE_OTHER): Payer: Self-pay | Admitting: Orthopedic Surgery

## 2014-02-17 DIAGNOSIS — S5290XA Unspecified fracture of unspecified forearm, initial encounter for closed fracture: Secondary | ICD-10-CM

## 2014-02-17 DIAGNOSIS — T8131XA Disruption of external operation (surgical) wound, not elsewhere classified, initial encounter: Secondary | ICD-10-CM

## 2014-02-17 NOTE — Progress Notes (Signed)
Patient came in today 02/17/14 for a dressing change. A wet to dry dressing was applied. Wound was clean, and a small amount of drainage was noted in bandages that were removed. Patient is to follow up Monday 02/20/14 with Dr. Aline Brochure and for xray.

## 2014-02-20 ENCOUNTER — Ambulatory Visit (INDEPENDENT_AMBULATORY_CARE_PROVIDER_SITE_OTHER): Payer: Self-pay | Admitting: Orthopedic Surgery

## 2014-02-20 ENCOUNTER — Ambulatory Visit (INDEPENDENT_AMBULATORY_CARE_PROVIDER_SITE_OTHER): Payer: Managed Care, Other (non HMO)

## 2014-02-20 ENCOUNTER — Encounter: Payer: Self-pay | Admitting: Orthopedic Surgery

## 2014-02-20 VITALS — BP 142/87 | Ht 74.0 in | Wt 245.0 lb

## 2014-02-20 DIAGNOSIS — S5290XA Unspecified fracture of unspecified forearm, initial encounter for closed fracture: Secondary | ICD-10-CM

## 2014-02-20 NOTE — Patient Instructions (Signed)
Nurse Change dressing Wednesday

## 2014-02-20 NOTE — Progress Notes (Signed)
Patient ID: Jonathan Hudson, male   DOB: 07-08-1966, 48 y.o.   MRN: [id number]  Chief Complaint  Patient presents with  . Follow-up    Dressing change and xray DOS 12/28/13    BP 142/87  Ht 6\' 2"  (1.88 m)  Wt 245 lb (111.131 kg)  BMI 31.44 kg/m2  Postop visit day #54 8 weeks postop x-ray shows excellent fracture without hardware failure  The surgical wound continues to improve  Recommend dressing change in 2 days x-ray at 12 weeks

## 2014-02-22 ENCOUNTER — Ambulatory Visit (INDEPENDENT_AMBULATORY_CARE_PROVIDER_SITE_OTHER): Payer: Self-pay | Admitting: Orthopedic Surgery

## 2014-02-22 DIAGNOSIS — S5290XA Unspecified fracture of unspecified forearm, initial encounter for closed fracture: Secondary | ICD-10-CM

## 2014-02-22 DIAGNOSIS — T8131XA Disruption of external operation (surgical) wound, not elsewhere classified, initial encounter: Secondary | ICD-10-CM

## 2014-02-22 NOTE — Progress Notes (Signed)
Patient came in today for dressing change. A wet to dry dressing was applied. The wound was clean and no drainage was noted. Patient is to come back Friday 02/24/14 for dressing change and keep follow up appointment with Dr. Aline Brochure.

## 2014-02-24 ENCOUNTER — Ambulatory Visit (INDEPENDENT_AMBULATORY_CARE_PROVIDER_SITE_OTHER): Payer: Self-pay | Admitting: Orthopedic Surgery

## 2014-02-24 VITALS — Ht 74.0 in | Wt 245.0 lb

## 2014-02-24 DIAGNOSIS — S5290XA Unspecified fracture of unspecified forearm, initial encounter for closed fracture: Secondary | ICD-10-CM

## 2014-02-24 DIAGNOSIS — T8131XA Disruption of external operation (surgical) wound, not elsewhere classified, initial encounter: Secondary | ICD-10-CM

## 2014-02-24 NOTE — Progress Notes (Signed)
Patient came in today for dressing change. A wet to dry dressing was applied. Wound was clean a small amount of drainage was noted on dressing that was removed. Dr. Aline Brochure was in the office, and he came in and looked at wound. Patient is to keep follow up appointment with Dr. Aline Brochure Monday 02/27/14.

## 2014-02-27 ENCOUNTER — Ambulatory Visit (INDEPENDENT_AMBULATORY_CARE_PROVIDER_SITE_OTHER): Payer: Self-pay | Admitting: Orthopedic Surgery

## 2014-02-27 VITALS — BP 130/91 | Ht 74.0 in | Wt 245.0 lb

## 2014-02-27 DIAGNOSIS — T8131XA Disruption of external operation (surgical) wound, not elsewhere classified, initial encounter: Secondary | ICD-10-CM

## 2014-02-27 DIAGNOSIS — S5290XA Unspecified fracture of unspecified forearm, initial encounter for closed fracture: Secondary | ICD-10-CM

## 2014-02-27 NOTE — Progress Notes (Signed)
Patient ID: Jonathan Hudson, male   DOB: [birth year], 48 y.o.   MRN: [id number] Chief Complaint  Patient presents with  . Wound Check    Dressing change Right arm DOS 12/28/13    Encounter Diagnoses  Name Primary?  . Forearm fracture   . Dehiscence of operative wound Yes    BP 130/91  Ht 6\' 2"  (1.88 m)  Wt 245 lb (111.131 kg)  BMI 31.44 kg/m2  Dressing changed. Patient did have some increased hand swelling without pain looks to be delayed the dressing was applied with the wrap. Otherwise normal  Followup Wednesday.

## 2014-03-01 ENCOUNTER — Ambulatory Visit (INDEPENDENT_AMBULATORY_CARE_PROVIDER_SITE_OTHER): Payer: Self-pay | Admitting: Orthopedic Surgery

## 2014-03-01 ENCOUNTER — Telehealth: Payer: Self-pay | Admitting: *Deleted

## 2014-03-01 DIAGNOSIS — S5290XA Unspecified fracture of unspecified forearm, initial encounter for closed fracture: Secondary | ICD-10-CM

## 2014-03-01 NOTE — Telephone Encounter (Signed)
Patient came in today for dressing change.Wound looked good, no drainage noted. He is almost out of antibiotics (Doxycycline Hyclate 100 mg). Do you want me to refill?  Also, He normally comes back on Friday for me to change his dressing, however, this Friday 03/03/14 I am scheduled to be on PAL, do you want to meet him Friday for his dressing change after your surgery, if so what time? Please advise.

## 2014-03-01 NOTE — Progress Notes (Signed)
Patient came in today for dressing change. Wound was clean, and no drainage noted. A wet to dry dressing was applied. Patient will be notified of time for next appointment.

## 2014-03-02 ENCOUNTER — Other Ambulatory Visit: Payer: Self-pay | Admitting: *Deleted

## 2014-03-02 DIAGNOSIS — T8131XA Disruption of external operation (surgical) wound, not elsewhere classified, initial encounter: Secondary | ICD-10-CM

## 2014-03-02 MED ORDER — DOXYCYCLINE HYCLATE 100 MG PO TABS: 100.0000 mg | ORAL_TABLET | Freq: Two times a day (BID) | ORAL | Status: DC

## 2014-03-02 NOTE — Telephone Encounter (Signed)
Refilled antibiotic per verbal order from Dr. Aline Brochure, and sent order for dressing change to PT at [facility] for 03/03/14. Patient to follow up here Monday 03/06/14. Patient is aware that prescription was sent in and to go to PT for dressing change.

## 2014-03-02 NOTE — Telephone Encounter (Signed)
No i have 3 surgerires Friday about 8 hours worth   Set uo dressing change at hospital thru PT

## 2014-03-06 ENCOUNTER — Encounter: Payer: Self-pay | Admitting: Orthopedic Surgery

## 2014-03-06 ENCOUNTER — Ambulatory Visit (INDEPENDENT_AMBULATORY_CARE_PROVIDER_SITE_OTHER): Payer: Self-pay | Admitting: Orthopedic Surgery

## 2014-03-06 VITALS — BP 138/91 | Ht 74.0 in | Wt 245.0 lb

## 2014-03-06 DIAGNOSIS — S5290XA Unspecified fracture of unspecified forearm, initial encounter for closed fracture: Secondary | ICD-10-CM

## 2014-03-06 DIAGNOSIS — T8131XA Disruption of external operation (surgical) wound, not elsewhere classified, initial encounter: Secondary | ICD-10-CM

## 2014-03-06 NOTE — Progress Notes (Signed)
Patient ID: Jonathan Hudson, male   DOB: 1965/11/08, 48 y.o.   MRN: [id number]  Chief Complaint  Patient presents with  . Follow-up    Dressing change right wrist DOS 12/28/13   BP 138/91  Ht 6\' 2"  (1.88 m)  Wt 245 lb (111.131 kg)  BMI 31.44 kg/m2  Bones continue to look very good. Jonathan Hudson is doing well on antibiotic.  Dressing change now includes Xeroform. I debrided the wound. Nice clean granulating tissue. Jonathan Hudson's not having any issues with his hand or elbow motion.  Followup Wednesday in her strange dressing

## 2014-03-08 ENCOUNTER — Ambulatory Visit: Payer: Managed Care, Other (non HMO) | Admitting: Orthopedic Surgery

## 2014-03-10 ENCOUNTER — Ambulatory Visit (INDEPENDENT_AMBULATORY_CARE_PROVIDER_SITE_OTHER): Payer: Self-pay | Admitting: Orthopedic Surgery

## 2014-03-10 DIAGNOSIS — S5290XA Unspecified fracture of unspecified forearm, initial encounter for closed fracture: Secondary | ICD-10-CM

## 2014-03-10 DIAGNOSIS — T8131XA Disruption of external operation (surgical) wound, not elsewhere classified, initial encounter: Secondary | ICD-10-CM

## 2014-03-10 NOTE — Progress Notes (Signed)
Patient came in today for dressing change. Wound was clean, no drainage noted. Advised to come Monday at 10:00 for another dressing change.

## 2014-03-13 ENCOUNTER — Telehealth: Payer: Self-pay | Admitting: *Deleted

## 2014-03-13 ENCOUNTER — Ambulatory Visit (INDEPENDENT_AMBULATORY_CARE_PROVIDER_SITE_OTHER): Payer: Managed Care, Other (non HMO) | Admitting: Orthopedic Surgery

## 2014-03-13 DIAGNOSIS — T8131XD Disruption of external operation (surgical) wound, not elsewhere classified, subsequent encounter: Secondary | ICD-10-CM

## 2014-03-13 DIAGNOSIS — Z5189 Encounter for other specified aftercare: Secondary | ICD-10-CM

## 2014-03-13 NOTE — Telephone Encounter (Signed)
Error, no note.

## 2014-03-13 NOTE — Telephone Encounter (Signed)
Patient asked today when he came in for his dressing change if he still needs to come every other day. Also, when will he need another xray? Please advise

## 2014-03-13 NOTE — Progress Notes (Signed)
Patient came in today 03/13/14 for dressing change. A new dressing was applied.

## 2014-03-14 NOTE — Telephone Encounter (Signed)
Pod 14   Post week 6 and 12  i ll have to look at the wound and see

## 2014-03-14 NOTE — Telephone Encounter (Signed)
Patient scheduled for 03/16/14 to see Dr. Aline Brochure

## 2014-03-15 ENCOUNTER — Ambulatory Visit: Payer: Managed Care, Other (non HMO) | Admitting: Orthopedic Surgery

## 2014-03-16 ENCOUNTER — Ambulatory Visit (INDEPENDENT_AMBULATORY_CARE_PROVIDER_SITE_OTHER): Payer: Self-pay | Admitting: Orthopedic Surgery

## 2014-03-16 VITALS — BP 125/84 | Ht 74.0 in | Wt 245.0 lb

## 2014-03-16 DIAGNOSIS — T8131XA Disruption of external operation (surgical) wound, not elsewhere classified, initial encounter: Secondary | ICD-10-CM

## 2014-03-16 DIAGNOSIS — S5290XA Unspecified fracture of unspecified forearm, initial encounter for closed fracture: Secondary | ICD-10-CM

## 2014-03-16 NOTE — Progress Notes (Signed)
Patient ID: Jonathan Hudson, male   DOB: 11-23-1965, 48 y.o.   MRN: [id number]  Chief Complaint  Patient presents with  . Follow-up    Wound check DOI 12/28/13    Clean wounds, supination; good  Pronation decreased   xrays May 19th

## 2014-03-23 ENCOUNTER — Encounter: Payer: Self-pay | Admitting: Orthopedic Surgery

## 2014-03-23 ENCOUNTER — Ambulatory Visit (INDEPENDENT_AMBULATORY_CARE_PROVIDER_SITE_OTHER): Payer: Managed Care, Other (non HMO) | Admitting: Orthopedic Surgery

## 2014-03-23 ENCOUNTER — Ambulatory Visit (INDEPENDENT_AMBULATORY_CARE_PROVIDER_SITE_OTHER): Payer: Managed Care, Other (non HMO)

## 2014-03-23 DIAGNOSIS — S5290XA Unspecified fracture of unspecified forearm, initial encounter for closed fracture: Secondary | ICD-10-CM

## 2014-03-23 NOTE — Patient Instructions (Signed)
June 1st return to work   OT for splint

## 2014-03-23 NOTE — Progress Notes (Signed)
Patient ID: Jonathan Hudson, male   DOB: 1966-02-02, 48 y.o.   MRN: [id number]  Post day 85 /12 weeks   Doing well   xrays fracture stable   Wounds look good  Orders Placed This Encounter  Procedures  . DG Forearm Right    Standing Status: Future     Number of Occurrences: 1     Standing Expiration Date: 05/24/2015    Order Specific Question:  Reason for Exam (SYMPTOM  OR DIAGNOSIS REQUIRED)    Answer:  forearm fracture    Order Specific Question:  Preferred imaging location?    Answer:  External  . Ambulatory referral to Occupational Therapy    Referral Priority:  Routine    Referral Type:  Occupational Therapy    Referral Reason:  Specialty Services Required    Requested Specialty:  Occupational Therapy    Number of Visits Requested:  1    Return work June 1   Follow 1 month

## 2014-03-24 ENCOUNTER — Telehealth: Payer: Self-pay | Admitting: Orthopedic Surgery

## 2014-03-24 NOTE — Telephone Encounter (Signed)
On 03/23/14 - Faxed notes including return to work note, date of service 03/23/14, to patient's employer for purpose of short-term disability claim, to employer, attention: [employer], attention: Seth Bake. Signed authorization on file. Patient aware.

## 2014-03-28 ENCOUNTER — Ambulatory Visit (HOSPITAL_COMMUNITY)
Admission: RE | Admit: 2014-03-28 | Discharge: 2014-03-28 | Disposition: A | Discharge status: Home / Self Care | Payer: Managed Care, Other (non HMO) | Source: Ambulatory Visit | Attending: Orthopedic Surgery | Admitting: Orthopedic Surgery

## 2014-03-28 DIAGNOSIS — M2560 Stiffness of unspecified joint, not elsewhere classified: Secondary | ICD-10-CM

## 2014-03-28 DIAGNOSIS — Z4689 Encounter for fitting and adjustment of other specified devices: Secondary | ICD-10-CM | POA: Insufficient documentation

## 2014-03-28 DIAGNOSIS — M25539 Pain in unspecified wrist: Secondary | ICD-10-CM | POA: Insufficient documentation

## 2014-03-28 DIAGNOSIS — IMO0001 Reserved for inherently not codable concepts without codable children: Secondary | ICD-10-CM | POA: Insufficient documentation

## 2014-03-28 NOTE — Evaluation (Addendum)
Occupational Therapy Evaluation  Patient Details  Name: Jonathan Hudson MRN: [id number] Date of Birth: January 21, 1966  Today's Date: 03/28/2014 Time: 4098-1191 OT Time Calculation (min): 51 min Eval [phone] (12') Splint Fabrication [phone] (39') Splint Supplies $46.35 (1/3 sheet + 3 velcro straps)  Visit#: 1 of 3  Re-eval: 04/11/14  Assessment Diagnosis: right wrist fracture   Authorization: [employer]  Authorization Time Period:    Authorization Visit#:   of     Past Medical History:  Past Medical History  Diagnosis Date  . Hypercholesteremia    Past Surgical History:  Past Surgical History  Procedure Laterality Date  . Cyst removed from left wrist     . Grafts N/A 10  years  ago    grafts to gums  . Right knee Right teenager    right knee   . Orif ulnar fracture Right 12/28/2013    Procedure: OPEN REDUCTION INTERNAL FIXATION (ORIF) ULNAR FRACTURE;  Surgeon: Carole Civil, MD;  Location: [facility];  Service: Orthopedics;  Laterality: Right;  . Open reduction internal fixation (orif) distal radial fracture Right 12/28/2013    Procedure: OPEN REDUCTION INTERNAL FIXATION (ORIF) DISTAL RADIAL FRACTURE;  Surgeon: Carole Civil, MD;  Location: [facility];  Service: Orthopedics;  Laterality: Right;    Subjective Symptoms/Limitations Symptoms: "it was as big as a football" (right wrist swelling) Pertinent History: pt is presnting for right wrist splint this date after a fall in February on snow and subsequent surgery.  He has an approx 6 in incision sites on borth the radial and ulnar sides of his forearm.  The ulnar side had become infected while in cast leading to removal of the cast early and wearing of a soft splint.  Pt is planning to return to work on Monday, 6/1.   Patient Stated Goals: to get back to work. Pain Assessment Currently in Pain?: No/denies  Assessment Additional Assessments RUE AROM (degrees) Right Forearm Pronation: 72 Degrees Right Forearm  Supination: 90 Degrees Right Wrist Extension: 40 Degrees Right Wrist Flexion: 52 Degrees     Exercise/Treatments   Splinting Splinting: Fabricated right wrist calm shell splint in slight wrsit extension.  Created wrist cock up splint for the volar portion of right wrist and forearm, with additional dorsal extion for wrist and forearm.  Applied 3 velcro straps along forearm for additional support.  Provided pt with handout on care of splint and pt verbalized understanding.  pt verbalized comfort with wearing splint and ability to call clinic if discomfort or irritation occurs.  Occupational Therapy Assessment and Plan OT Assessment and Plan Clinical Impression Statement: Pt presented to OT visit for eval and fabrication of clam shell wrist splint.  Fabricated splint for increased wrist support and safety during work related activities. Pt will benefit from skilled therapeutic intervention in order to improve on the following deficits: Impaired UE functional use OT Frequency: Min 1X/week OT Duration: 2 weeks OT Treatment/Interventions: Splinting OT Plan: pt will benefit from further OT services for splint follow-up.      Treatment Plan: Call pt in 3-5 days to follow up on splint wear and care.   Goals Short Term Goals Short Term Goal 1: Fabricate clam shell splint for right wrist Short Term Goal 2: Call pt in 3-5 days to follow up on wear and care of splint.  Problem List Patient Active Problem List   Diagnosis Date Noted  . Range of motion deficit 03/28/2014  . Dehiscence of operative wound 01/24/2014  . Fracture  of forearm, closed 01/24/2014  . Closed fracture of shaft of right radius and ulna 12/28/2013  . Forearm fracture 12/28/2013  . Fx radius/ulna shaft-closed 12/28/2013    End of Session Activity Tolerance: Patient tolerated treatment well General Behavior During Therapy: [facility] for tasks assessed/performed  GO    Bea Graff, MS, OTR/L [phone]  03/28/2014,  12:40 PM  Physician Documentation Your signature is required to indicate approval of the treatment plan as stated above.  Please sign and either send electronically or make a copy of this report for your files and return this physician signed original.  Please mark one 1.__approve of plan  2. ___approve of plan with the following conditions.   ______________________________                                                          _____________________ Physician Signature                                                                                                             Date

## 2014-05-02 ENCOUNTER — Ambulatory Visit (INDEPENDENT_AMBULATORY_CARE_PROVIDER_SITE_OTHER): Payer: Managed Care, Other (non HMO) | Admitting: Orthopedic Surgery

## 2014-05-02 VITALS — BP 130/84 | Ht 74.0 in | Wt 245.0 lb

## 2014-05-02 DIAGNOSIS — S5290XA Unspecified fracture of unspecified forearm, initial encounter for closed fracture: Secondary | ICD-10-CM

## 2014-05-02 NOTE — Patient Instructions (Signed)
Activities as tolerated. 

## 2014-05-02 NOTE — Progress Notes (Signed)
Patient ID: Jonathan Hudson, male   DOB: Apr 26, 1966, 48 y.o.   MRN: [id number]  Chief Complaint  Patient presents with  . Follow-up    1 month follow up right forearm    BP 130/84  Ht 6\' 2"  (1.88 m)  Wt 245 lb (111.131 kg)  BMI 31.44 kg/m2  No complaints at this time at the right upper extremity. The patient returned all normal activities including job activities.  Review of systems denies numbness or tingling or weakness in the right upper extremity  He has well-healed incisions without tenderness or neuroma. Range of motion is returned to normal. Wrist and elbow are stable skin incisions are clean motor exam is normal has a good pulse and normal sensation  Recommend x-ray at one year post injury otherwise return to normal activities

## 2014-12-12 ENCOUNTER — Ambulatory Visit: Payer: Managed Care, Other (non HMO) | Admitting: Orthopedic Surgery

## 2014-12-25 ENCOUNTER — Ambulatory Visit (INDEPENDENT_AMBULATORY_CARE_PROVIDER_SITE_OTHER): Payer: Managed Care, Other (non HMO)

## 2014-12-25 ENCOUNTER — Ambulatory Visit (INDEPENDENT_AMBULATORY_CARE_PROVIDER_SITE_OTHER): Payer: Managed Care, Other (non HMO) | Admitting: Orthopedic Surgery

## 2014-12-25 ENCOUNTER — Encounter: Payer: Self-pay | Admitting: Orthopedic Surgery

## 2014-12-25 VITALS — BP 136/80 | Ht 74.0 in | Wt 254.0 lb

## 2014-12-25 DIAGNOSIS — S5291XD Unspecified fracture of right forearm, subsequent encounter for closed fracture with routine healing: Secondary | ICD-10-CM

## 2014-12-25 NOTE — Progress Notes (Signed)
Chief Complaint  Patient presents with  . Follow-up    Recheck on right forearm fracture, DOS 12-28-13.    Encounter Diagnosis  Name Primary?  . Forearm fracture, right, closed, with routine healing, subsequent encounter Yes    One year follow-up both bone forearm fracture treated with plating  The patient has no complaints  Review of systems negative  Exam shows healed incisions. Range of motion is elbow wrist are stable motor exam is normal skin incision pulses are good sensation is normal lymph nodes are negative  X-ray show complete resolution of fracture without hardware complication  Patient released all and full normal activities

## 2015-02-18 IMAGING — RF DG C-ARM 61-120 MIN
1 series · 3 of 3 positions shown · non-contrast
Comparison: DG FOREARM*R* dated 12/28/2013

CLINICAL DATA: Intra op evaluation. Right forearm

EXAM:
RIGHT FOREARM - 2 VIEW

[Series 1: run · 3 of 3 slices shown]
[im 1/3]
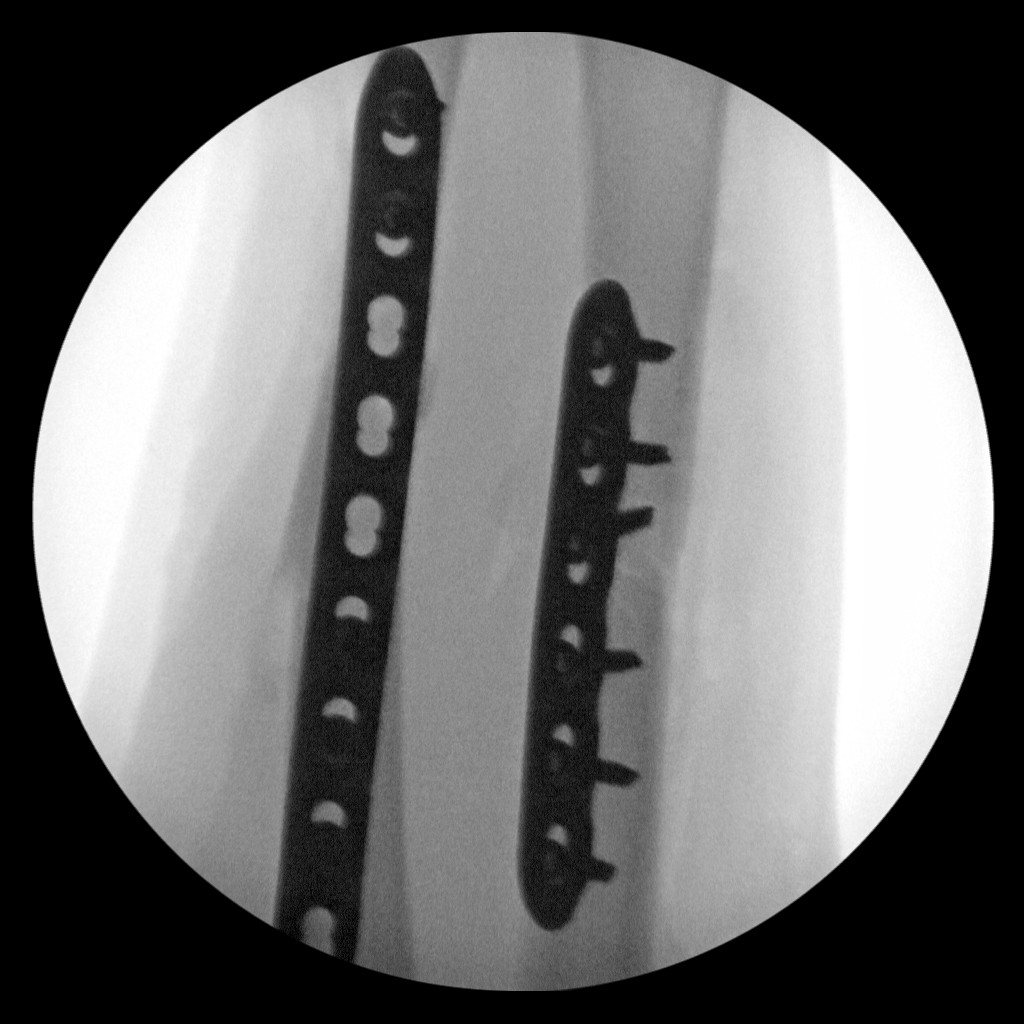
[im 2/3]
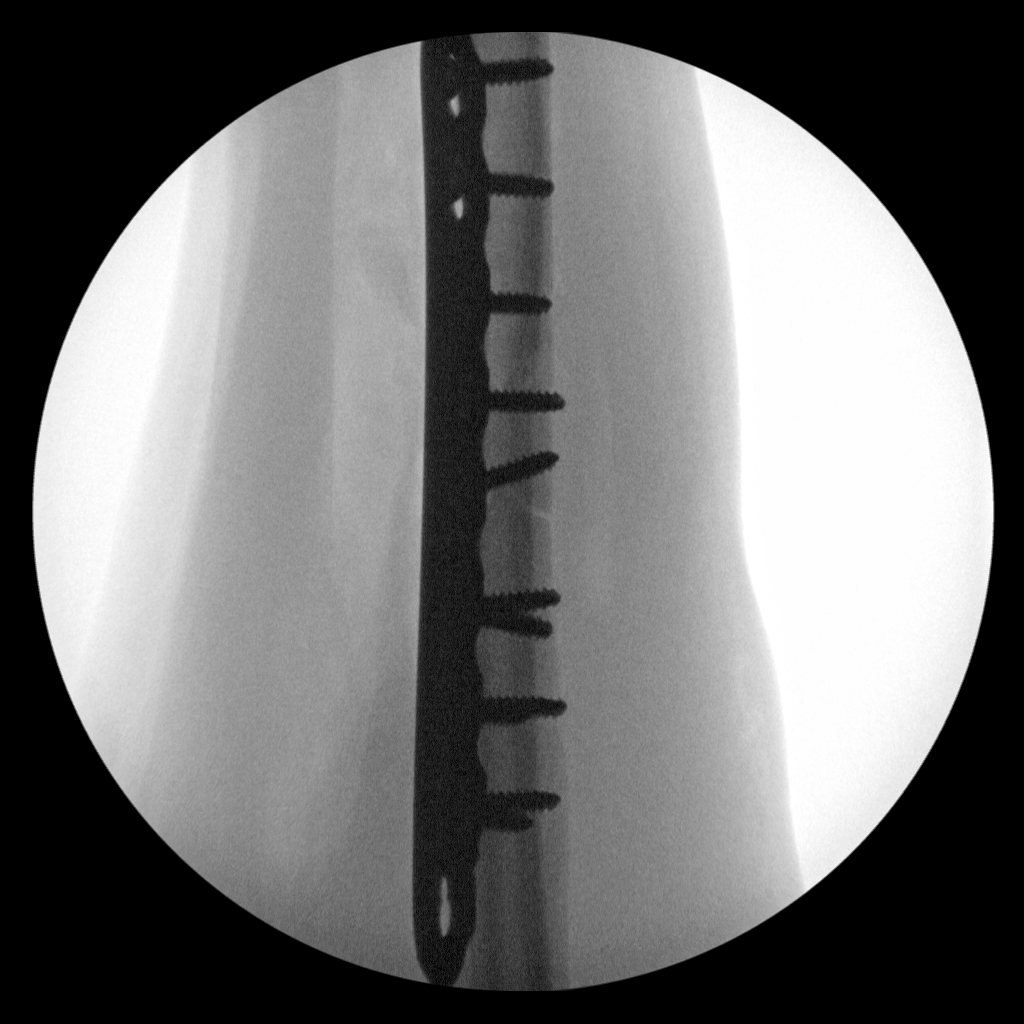
[im 3/3]
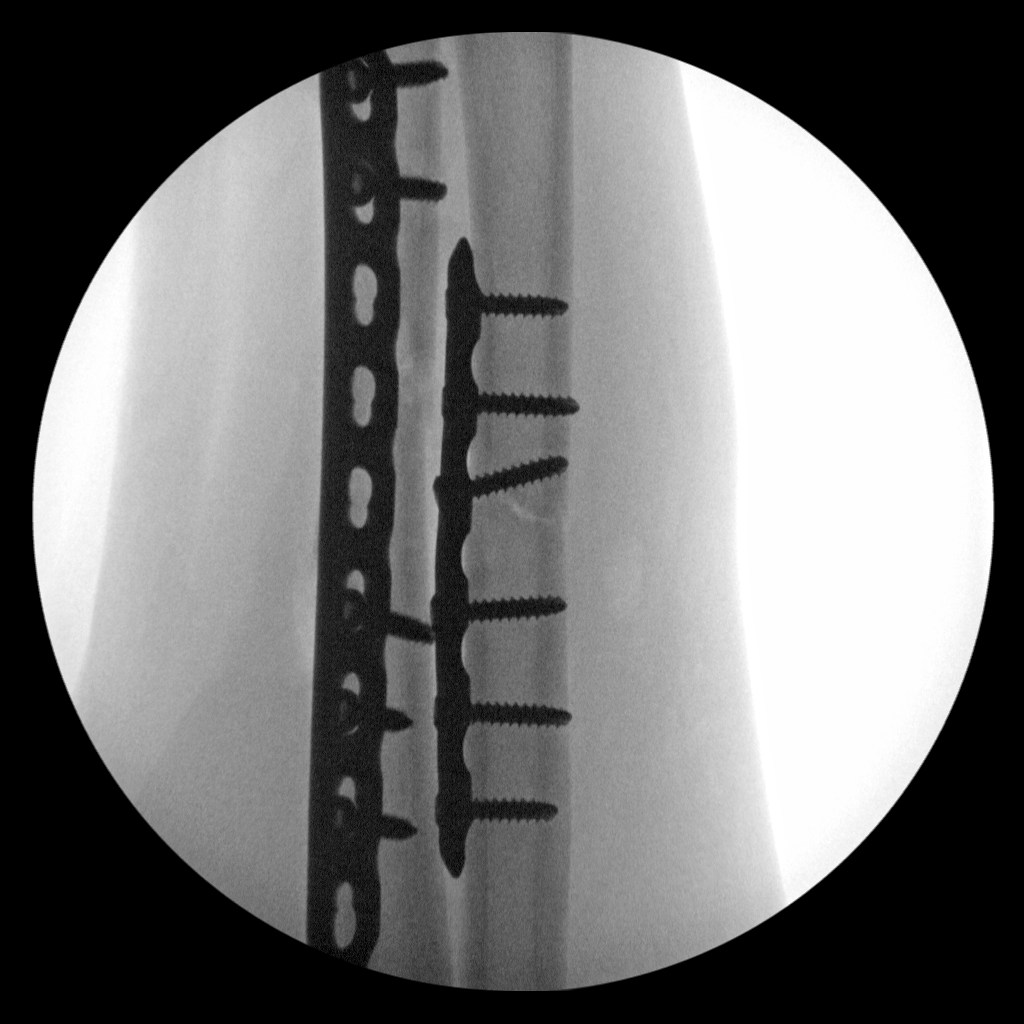

[3 of 3 positions shown; findings below may reference images not displayed]

FINDINGS: Patient is status post open reduction internal fixation of a distal
radius and distal ulna fracture. Buttress plate insertion with
cannulated screw fixation is appreciated. The hardware appears
intact.
IMPRESSION: Open reduction internal fixation. Distal radius and ulnar fractures.

## 2015-02-18 IMAGING — CR DG CHEST 1V
1 series · 1 of 1 positions shown · non-contrast
Comparison: None available for comparison at time of study
interpretation.

CLINICAL DATA: Forearm fracture.

EXAM:
CHEST - 1 VIEW

[ap]
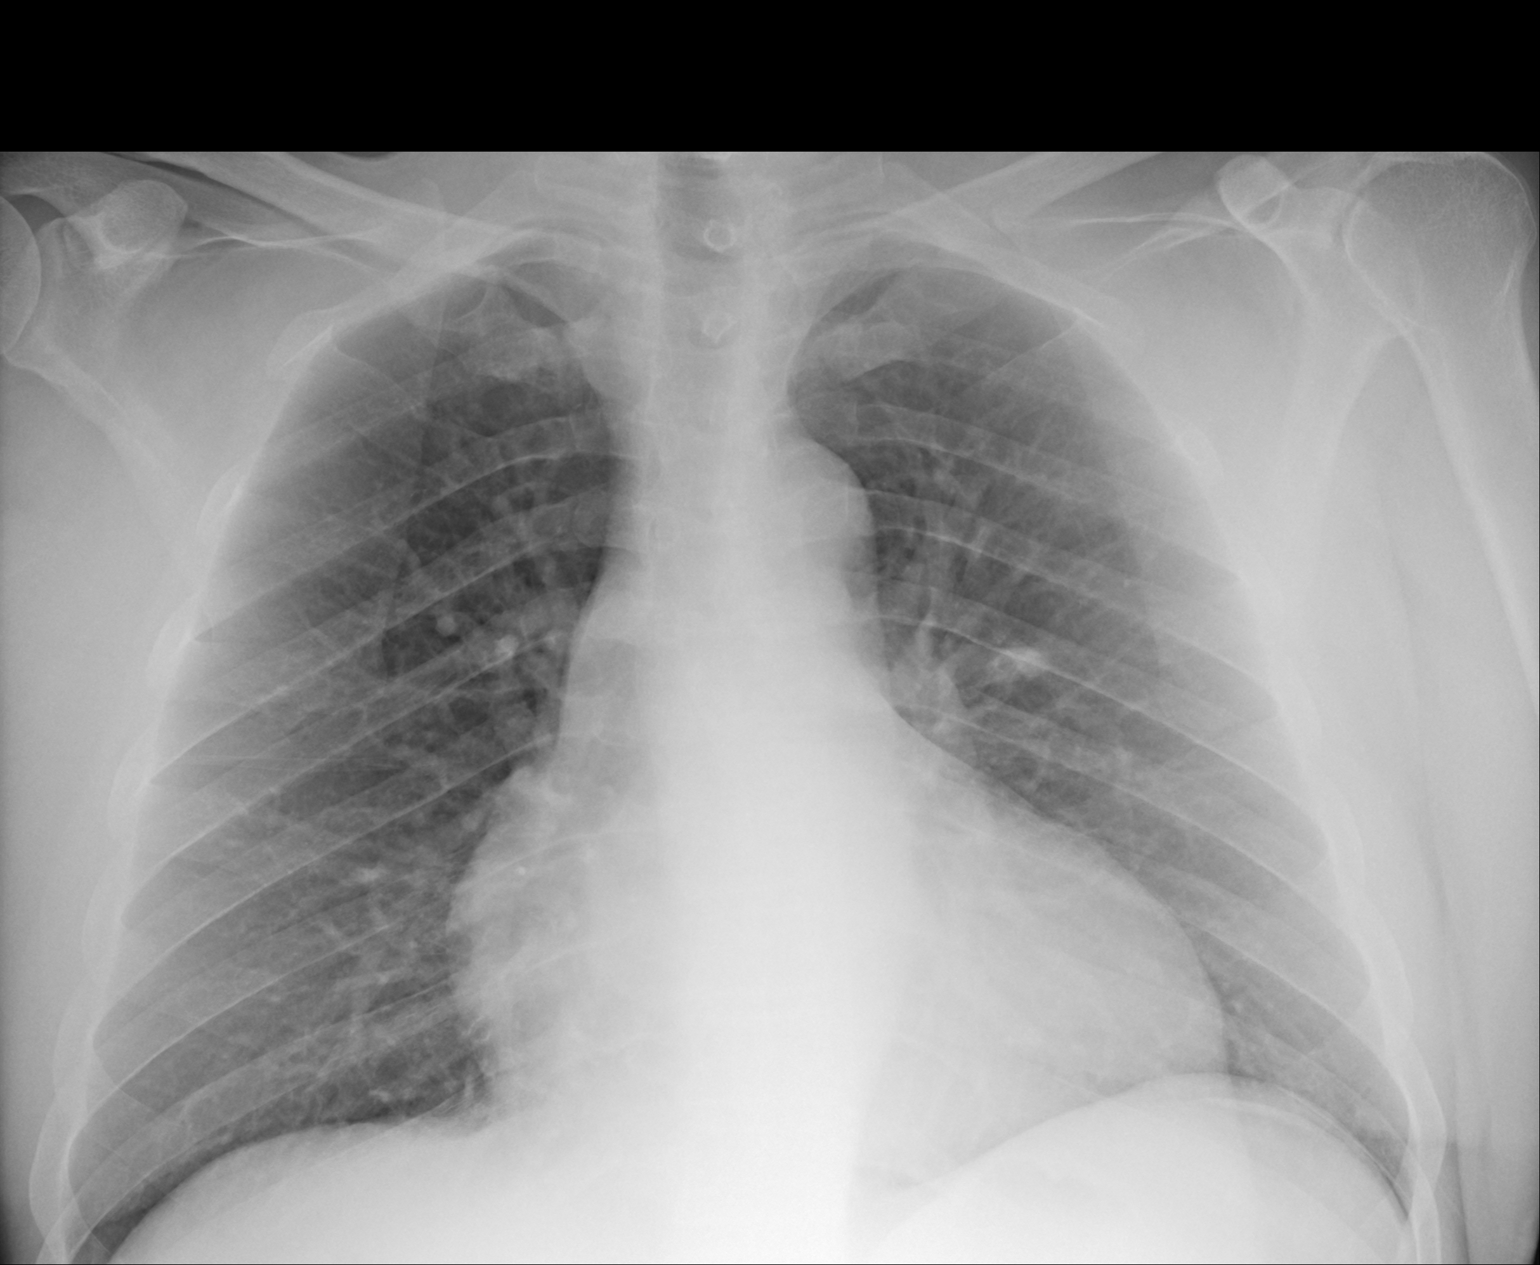

[1 of 1 positions shown; findings below may reference images not displayed]

FINDINGS: The cardiac silhouette appears mild to moderately enlarged,
mediastinal silhouette is nonsuspicious. Mild central pulmonary
vasculature congestion. Lungs are clear, no pleural effusions or
focal consolidations though, right costophrenic angle is
incompletely imaged. No pneumothorax. Soft tissue planes and
included osseous structures are nonsuspicious.
IMPRESSION: Mild to moderate cardiomegaly with mild central pulmonary
vasculature congestion.

  By: Geriause Mikis

## 2016-04-15 ENCOUNTER — Ambulatory Visit (INDEPENDENT_AMBULATORY_CARE_PROVIDER_SITE_OTHER): Payer: 59

## 2016-04-15 ENCOUNTER — Ambulatory Visit (INDEPENDENT_AMBULATORY_CARE_PROVIDER_SITE_OTHER): Payer: 59 | Admitting: Orthopedic Surgery

## 2016-04-15 VITALS — BP 141/94 | HR 68 | Ht 74.0 in | Wt 247.2 lb

## 2016-04-15 DIAGNOSIS — M25531 Pain in right wrist: Secondary | ICD-10-CM

## 2016-04-15 DIAGNOSIS — M6740 Ganglion, unspecified site: Secondary | ICD-10-CM

## 2016-04-15 NOTE — Progress Notes (Signed)
Chief Complaint  Patient presents with  . new problem    Right wrist cyst    hpi 50 year old right-hand-dominant male status post both bone forearm fracture right forearm treated with open treatment internal fixation presents with a mass on the dorsal side of the wrist which is mildly uncomfortable present for several weeks does not appear to be associated with the fracture or the surgery. No significant long-term pain is been associated with this and no wrist pain is associated  Review of systems he reported no fever chills numbness but did report some tingling associated with it.  Past Medical History  Diagnosis Date  . Hypercholesteremia     BP 141/94 mmHg  Pulse 68  Ht 6\' 2"  (1.88 m)  Wt 247 lb 3.2 oz (112.129 kg)  BMI 31.73 kg/m2 Physical Exam  Constitutional: She is oriented to person, place, and time. She appears well-developed and well-nourished. No distress.  Cardiovascular: Intact distal pulses.   Neurological: She is alert and oriented to person, place, and time. She exhibits normal muscle tone. Coordination normal.  Skin: Skin is warm and dry. No rash noted. She is not diaphoretic. No erythema. No pallor.  Psychiatric: She has a normal mood and affect. Her behavior is normal. Judgment and thought content normal.  Gait is normal   Further skin examination reveals incisions over the fracture sites which are normal on the volar side of the wrist  There is a dorsal mass about 2 cm x 1 cm over the wrist joint is firm it is mobile it is rubbery. Wrist range of motion is normal it's nontender wrist is stable grip strength is normal good radial pulses noted no lymph nodes are enlarged on the involved right side  X-rays were ordered and obtained a hardware is intact the fractures are well aligned  The soft tissue mass is noted over the wrist and the skin most likely a ganglion cyst  He agreed to aspiration. Attempt at aspiration was performed but was unsuccessful  Aspiration  report and procedure note  Diagnosis dorsal ganglion cyst  Alcohol and ethyl chloride were used.  We aspirated with a 18-gauge needle but were unsuccessful in any fluid coming out  He will follow-up as necessary if he decides to have it excised

## 2016-04-15 NOTE — Patient Instructions (Signed)
Ganglion Cyst  A ganglion cyst is a noncancerous, fluid-filled lump that occurs near joints or tendons. The ganglion cyst grows out of a joint or the lining of a tendon. It most often develops in the hand or wrist, but it can also develop in the shoulder, elbow, hip, knee, ankle, or foot. The round or oval ganglion cyst can be the size of a pea or larger than a grape. Increased activity may enlarge the size of the cyst because more fluid starts to build up.   CAUSES  It is not known what causes a ganglion cyst to grow. However, it may be related to:  · Inflammation or irritation around the joint.  · An injury.  · Repetitive movements or overuse.  · Arthritis.  RISK FACTORS  Risk factors include:  · Being a woman.  · Being age 20-50.  SIGNS AND SYMPTOMS  Symptoms may include:   · A lump. This most often appears on the hand or wrist, but it can occur in other areas of the body.  · Tingling.  · Pain.  · Numbness.  · Muscle weakness.  · Weak grip.  · Less movement in a joint.  DIAGNOSIS  Ganglion cysts are most often diagnosed based on a physical exam. Your health care provider will feel the lump and may shine a light alongside it. If it is a ganglion cyst, a light often shines through it. Your health care provider may order an X-ray, ultrasound, or MRI to rule out other conditions.  TREATMENT  Ganglion cysts usually go away on their own without treatment. If pain or other symptoms are involved, treatment may be needed. Treatment is also needed if the ganglion cyst limits your movement or if it gets infected. Treatment may include:  · Wearing a brace or splint on your wrist or finger.  · Taking anti-inflammatory medicine.  · Draining fluid from the lump with a needle (aspiration).  · Injecting a steroid into the joint.  · Surgery to remove the ganglion cyst.  HOME CARE INSTRUCTIONS  · Do not press on the ganglion cyst, poke it with a needle, or hit it.  · Take medicines only as directed by your health care  provider.  · Wear your brace or splint as directed by your health care provider.  · Watch your ganglion cyst for any changes.  · Keep all follow-up visits as directed by your health care provider. This is important.  SEEK MEDICAL CARE IF:  · Your ganglion cyst becomes larger or more painful.  · You have increased redness, red streaks, or swelling.  · You have pus coming from the lump.  · You have weakness or numbness in the affected area.  · You have a fever or chills.     This information is not intended to replace advice given to you by your health care provider. Make sure you discuss any questions you have with your health care provider.     Document Released: 10/17/2000 Document Revised: 11/10/2014 Document Reviewed: 04/04/2014  Elsevier Interactive Patient Education ©2016 [employer]

## 2016-10-08 ENCOUNTER — Encounter (INDEPENDENT_AMBULATORY_CARE_PROVIDER_SITE_OTHER): Payer: Self-pay | Admitting: *Deleted

## 2016-10-23 ENCOUNTER — Other Ambulatory Visit (INDEPENDENT_AMBULATORY_CARE_PROVIDER_SITE_OTHER): Payer: Self-pay | Admitting: *Deleted

## 2016-10-23 DIAGNOSIS — Z1211 Encounter for screening for malignant neoplasm of colon: Secondary | ICD-10-CM

## 2016-10-23 DIAGNOSIS — Z800 Family history of malignant neoplasm of digestive organs: Secondary | ICD-10-CM | POA: Insufficient documentation

## 2016-12-05 ENCOUNTER — Emergency Department (HOSPITAL_COMMUNITY)
Admission: EM | Admit: 2016-12-05 | Discharge: 2016-12-05 | Disposition: A | Discharge status: Home / Self Care | Payer: 59 | Attending: Emergency Medicine | Admitting: Emergency Medicine

## 2016-12-05 ENCOUNTER — Encounter (HOSPITAL_COMMUNITY): Payer: Self-pay

## 2016-12-05 DIAGNOSIS — Z79899 Other long term (current) drug therapy: Secondary | ICD-10-CM | POA: Diagnosis not present

## 2016-12-05 DIAGNOSIS — I10 Essential (primary) hypertension: Secondary | ICD-10-CM | POA: Insufficient documentation

## 2016-12-05 DIAGNOSIS — L0231 Cutaneous abscess of buttock: Secondary | ICD-10-CM | POA: Insufficient documentation

## 2016-12-05 HISTORY — DX: Essential (primary) hypertension: I10

## 2016-12-05 MED ORDER — HYDROCODONE-ACETAMINOPHEN 5-325 MG PO TABS
2.0000 | ORAL_TABLET | ORAL | 0 refills | Status: DC | PRN
Start: 2016-12-05 — End: 2017-01-29

## 2016-12-05 MED ORDER — POVIDONE-IODINE 10 % EX SOLN
CUTANEOUS | Status: AC
  Filled 2016-12-05: qty 118

## 2016-12-05 MED ORDER — LIDOCAINE HCL (PF) 2 % IJ SOLN
INTRAMUSCULAR | Status: AC
  Filled 2016-12-05: qty 10

## 2016-12-05 MED ORDER — SULFAMETHOXAZOLE-TRIMETHOPRIM 800-160 MG PO TABS: 1.0000 | ORAL_TABLET | Freq: Two times a day (BID) | ORAL | 0 refills | Status: AC

## 2016-12-05 NOTE — Discharge Instructions (Signed)
Remove packing in 2 days

## 2016-12-05 NOTE — ED Provider Notes (Signed)
[facility] DEPT Provider Note   CSN: [id number] Arrival date & time: 12/05/16  1319     History   Chief Complaint Chief Complaint  Patient presents with  . Abscess    HPI Jonathan Hudson is a 51 y.o. male.  The history is provided by the patient. No language interpreter was used.  Abscess  Abscess location: 1 cm. Size:  1  Abscess quality: draining and induration   Progression:  Worsening Chronicity:  New Relieved by:  Nothing Worsened by:  Nothing Ineffective treatments:  None tried Risk factors: prior abscess    Pt complains of an abscess on left buttock Past Medical History:  Diagnosis Date  . Hypercholesteremia   . Hypertension     Patient Active Problem List   Diagnosis Date Noted  . Special screening for malignant neoplasms, colon 10/23/2016  . Family hx of colon cancer 10/23/2016  . Range of motion deficit 03/28/2014  . Dehiscence of operative wound 01/24/2014  . Fracture of forearm, closed 01/24/2014  . Closed fracture of shaft of right radius and ulna 12/28/2013  . Forearm fracture 12/28/2013  . Fx radius/ulna shaft-closed 12/28/2013    Past Surgical History:  Procedure Laterality Date  . cyst removed from left wrist     . grafts N/A 10  years  ago   grafts to gums  . OPEN REDUCTION INTERNAL FIXATION (ORIF) DISTAL RADIAL FRACTURE Right 12/28/2013   Procedure: OPEN REDUCTION INTERNAL FIXATION (ORIF) DISTAL RADIAL FRACTURE;  Surgeon: Carole Civil, MD;  Location: [facility];  Service: Orthopedics;  Laterality: Right;  . ORIF ULNAR FRACTURE Right 12/28/2013   Procedure: OPEN REDUCTION INTERNAL FIXATION (ORIF) ULNAR FRACTURE;  Surgeon: Carole Civil, MD;  Location: [facility];  Service: Orthopedics;  Laterality: Right;  . right knee Right teenager   right knee        Home Medications    Prior to Admission medications   Medication Sig Start Date End Date Taking? Authorizing Provider  atorvastatin (LIPITOR) 40 MG tablet Take 40 mg by mouth  daily. Reported on 04/15/2016    Historical Provider, MD  HYDROcodone-acetaminophen (NORCO/VICODIN) 5-325 MG tablet Take 2 tablets by mouth every 4 (four) hours as needed. 12/05/16   Fransico Meadow, PA-C  ibuprofen (ADVIL,MOTRIN) 800 MG tablet Take 1 tablet (800 mg total) by mouth every 8 (eight) hours as needed. Patient not taking: Reported on 04/15/2016 12/29/13   Carole Civil, MD  lisinopril (PRINIVIL,ZESTRIL) 5 MG tablet Take 5 mg by mouth daily. 11/10/13   Historical Provider, MD  promethazine (PHENERGAN) 12.5 MG tablet Take 1 tablet (12.5 mg total) by mouth every 6 (six) hours as needed for nausea or vomiting. Patient not taking: Reported on 04/15/2016 12/29/13   Carole Civil, MD  sulfamethoxazole-trimethoprim (BACTRIM DS,SEPTRA DS) 800-160 MG tablet Take 1 tablet by mouth 2 (two) times daily. 12/05/16 12/12/16  Fransico Meadow, PA-C    Family History No family history on file.  Social History Social History  Substance Use Topics  . Smoking status: Never Smoker  . Smokeless tobacco: Never Used  . Alcohol use Yes     Comment: Occ  beer or wine      Allergies   Patient has no known allergies.   Review of Systems Review of Systems  All other systems reviewed and are negative.    Physical Exam Updated Vital Signs BP 144/89 (BP Location: Left Arm)   Pulse 86   Temp 98.6 F (37 C) (Oral)  Resp 18   Ht 6\' 2"  (1.88 m)   Wt 117.9 kg   SpO2 95%   BMI 33.38 kg/m   Physical Exam  Constitutional: He is oriented to person, place, and time. He appears well-developed and well-nourished.  HENT:  Head: Normocephalic.  Eyes: Pupils are equal, round, and reactive to light.  Cardiovascular: Normal rate.   Pulmonary/Chest: Effort normal.  Abdominal: Soft.  Musculoskeletal: Normal range of motion.  Neurological: He is alert and oriented to person, place, and time.  Skin: Skin is warm.  1 cm abscess   Psychiatric: He has a normal mood and affect.  Nursing note and vitals  reviewed.    ED Treatments / Results  Labs (all labs ordered are listed, but only abnormal results are displayed) Labs Reviewed - No data to display  EKG  EKG Interpretation None       Radiology No results found.  Procedures .Marland KitchenIncision and Drainage Date/Time: 12/05/2016 3:26 PM Performed by: Fransico Meadow Authorized by: Fransico Meadow   Consent:    Consent obtained:  Verbal   Consent given by:  Patient Location:    Type:  Abscess   Size:  1 cm Pre-procedure details:    Skin preparation:  Betadine Procedure type:    Complexity:  Simple Procedure details:    Incision types:  Single straight   Scalpel blade:  11   Wound treatment:  Drain placed   Packing materials:  1/4 in gauze Post-procedure details:    Patient tolerance of procedure:  Tolerated well, no immediate complications   (including critical care time)  Medications Ordered in ED Medications  lidocaine (XYLOCAINE) 2 % injection (not administered)  povidone-iodine (BETADINE) 10 % external solution (not administered)     Initial Impression / Assessment and Plan / ED Course  I have reviewed the triage vital signs and the nursing notes.  Pertinent labs & imaging results that were available during my care of the patient were reviewed by me and considered in my medical decision making (see chart for details).       Final Clinical Impressions(s) / ED Diagnoses   Final diagnoses:  Abscess of buttock, left    New Prescriptions New Prescriptions   HYDROCODONE-ACETAMINOPHEN (NORCO/VICODIN) 5-325 MG TABLET    Take 2 tablets by mouth every 4 (four) hours as needed.   SULFAMETHOXAZOLE-TRIMETHOPRIM (BACTRIM DS,SEPTRA DS) 800-160 MG TABLET    Take 1 tablet by mouth 2 (two) times daily.   An After Visit Summary was printed and given to the patient.   Hollace Kinnier Dorothy, PA-C 12/05/16 [place], MD 12/05/16 1535

## 2016-12-05 NOTE — ED Triage Notes (Signed)
Pt has abscess to left buttocks for approximately on week. Seen by Dr Clayburn Pert this morning and was told to come to ED. No drainage

## 2016-12-08 ENCOUNTER — Telehealth (INDEPENDENT_AMBULATORY_CARE_PROVIDER_SITE_OTHER): Payer: Self-pay | Admitting: *Deleted

## 2016-12-08 ENCOUNTER — Encounter (INDEPENDENT_AMBULATORY_CARE_PROVIDER_SITE_OTHER): Payer: Self-pay | Admitting: *Deleted

## 2016-12-08 NOTE — Telephone Encounter (Signed)
Patient need trilyte 

## 2016-12-09 MED ORDER — PEG 3350-KCL-NA BICARB-NACL 420 G PO SOLR: 4000.0000 mL | Freq: Once | ORAL | 0 refills | Status: AC

## 2016-12-13 ENCOUNTER — Encounter (HOSPITAL_COMMUNITY): Payer: Self-pay | Admitting: Emergency Medicine

## 2016-12-13 ENCOUNTER — Emergency Department (HOSPITAL_COMMUNITY)
Admission: EM | Admit: 2016-12-13 | Discharge: 2016-12-13 | Disposition: A | Discharge status: Home / Self Care | Payer: 59 | Attending: Emergency Medicine | Admitting: Emergency Medicine

## 2016-12-13 ENCOUNTER — Emergency Department (HOSPITAL_COMMUNITY): Payer: 59

## 2016-12-13 DIAGNOSIS — I10 Essential (primary) hypertension: Secondary | ICD-10-CM | POA: Diagnosis not present

## 2016-12-13 DIAGNOSIS — B349 Viral infection, unspecified: Secondary | ICD-10-CM

## 2016-12-13 DIAGNOSIS — Z79899 Other long term (current) drug therapy: Secondary | ICD-10-CM | POA: Insufficient documentation

## 2016-12-13 DIAGNOSIS — R05 Cough: Secondary | ICD-10-CM | POA: Diagnosis present

## 2016-12-13 MED ORDER — SODIUM CHLORIDE 0.9 % IV BOLUS (SEPSIS)
1000.0000 mL | Freq: Once | INTRAVENOUS | Status: AC
  Administered 2016-12-13: 1000 mL via INTRAVENOUS

## 2016-12-13 MED ORDER — ONDANSETRON HCL 4 MG/2ML IJ SOLN
INTRAMUSCULAR | Status: AC
  Filled 2016-12-13: qty 2

## 2016-12-13 MED ORDER — ONDANSETRON HCL 4 MG/2ML IJ SOLN
4.0000 mg | Freq: Once | INTRAMUSCULAR | Status: AC
  Administered 2016-12-13: 4 mg via INTRAVENOUS

## 2016-12-13 NOTE — ED Provider Notes (Signed)
[facility] DEPT Provider Note   CSN: [id number] Arrival date & time: 12/13/16  [id number]     History   Chief Complaint Chief Complaint  Patient presents with  . Cough  . Nasal Congestion    HPI Jonathan Hudson is a 51 y.o. male.  HPI  Past Medical History:  Diagnosis Date  . Hypercholesteremia   . Hypertension     Patient Active Problem List   Diagnosis Date Noted  . Special screening for malignant neoplasms, colon 10/23/2016  . Family hx of colon cancer 10/23/2016  . Range of motion deficit 03/28/2014  . Dehiscence of operative wound 01/24/2014  . Fracture of forearm, closed 01/24/2014  . Closed fracture of shaft of right radius and ulna 12/28/2013  . Forearm fracture 12/28/2013  . Fx radius/ulna shaft-closed 12/28/2013    Past Surgical History:  Procedure Laterality Date  . cyst removed from left wrist     . grafts N/A 10  years  ago   grafts to gums  . OPEN REDUCTION INTERNAL FIXATION (ORIF) DISTAL RADIAL FRACTURE Right 12/28/2013   Procedure: OPEN REDUCTION INTERNAL FIXATION (ORIF) DISTAL RADIAL FRACTURE;  Surgeon: Carole Civil, MD;  Location: [facility];  Service: Orthopedics;  Laterality: Right;  . ORIF ULNAR FRACTURE Right 12/28/2013   Procedure: OPEN REDUCTION INTERNAL FIXATION (ORIF) ULNAR FRACTURE;  Surgeon: Carole Civil, MD;  Location: [facility];  Service: Orthopedics;  Laterality: Right;  . right knee Right teenager   right knee        Home Medications    Prior to Admission medications   Medication Sig Start Date End Date Taking? Authorizing Provider  atorvastatin (LIPITOR) 40 MG tablet Take 40 mg by mouth daily. Reported on 04/15/2016   Yes Historical Provider, MD  azithromycin (ZITHROMAX) 250 MG tablet Take 250 mg by mouth as directed.   Yes Historical Provider, MD  HYDROcodone-acetaminophen (NORCO/VICODIN) 5-325 MG tablet Take 2 tablets by mouth every 4 (four) hours as needed. 12/05/16  Yes Hollace Kinnier Sofia, PA-C  lisinopril  (PRINIVIL,ZESTRIL) 5 MG tablet Take 5 mg by mouth daily. 11/10/13  Yes Historical Provider, MD  ibuprofen (ADVIL,MOTRIN) 800 MG tablet Take 1 tablet (800 mg total) by mouth every 8 (eight) hours as needed. Patient not taking: Reported on 04/15/2016 12/29/13   Carole Civil, MD    Family History No family history on file.  Social History Social History  Substance Use Topics  . Smoking status: Never Smoker  . Smokeless tobacco: Never Used  . Alcohol use Yes     Comment: Occ  beer or wine      Allergies   Patient has no known allergies.   Review of Systems Review of Systems   Physical Exam Updated Vital Signs BP 140/79 (BP Location: Left Arm)   Pulse 60   Temp 98.2 F (36.8 C) (Oral)   Resp 14   Ht 6\' 2"  (1.88 m)   Wt 250 lb (113.4 kg)   SpO2 97%   BMI 32.10 kg/m   Physical Exam   ED Treatments / Results  Labs (all labs ordered are listed, but only abnormal results are displayed) Labs Reviewed - No data to display  EKG  EKG Interpretation None       Radiology Dg Chest 2 View  Result Date: 12/13/2016 CLINICAL DATA:  Cough, congestion, and diarrhea. Seen by primary care physician on Thursday is started antibiotics. EXAM: CHEST  2 VIEW COMPARISON:  12/28/2013 FINDINGS: Normal heart size and pulmonary vascularity.  No focal airspace disease or consolidation in the lungs. No blunting of costophrenic angles. No pneumothorax. Mediastinal contours appear intact. IMPRESSION: No active cardiopulmonary disease. Electronically Signed   By: Lucienne Capers M.D.   On: 12/13/2016 06:48    Procedures Procedures (including critical care time)  Medications Ordered in ED Medications  sodium chloride 0.9 % bolus 1,000 mL (0 mLs Intravenous Stopped 12/13/16 0931)  ondansetron (ZOFRAN) injection 4 mg (4 mg Intravenous Given 12/13/16 0738)  sodium chloride 0.9 % bolus 1,000 mL (0 mLs Intravenous Stopped 12/13/16 0931)     Initial Impression / Assessment and Plan / ED Course   I have reviewed the triage vital signs and the nursing notes.  Pertinent labs & imaging results that were available during my care of the patient were reviewed by me and considered in my medical decision making (see chart for details).    Patient is nontoxic-appearing. He feels better after IV fluids. He is ambulatory at discharge.   Final Clinical Impressions(s) / ED Diagnoses   Final diagnoses:  Viral syndrome    New Prescriptions Discharge Medication List as of 12/13/2016 10:27 AM       Nat Christen, MD 12/13/16 [phone]

## 2016-12-13 NOTE — ED Notes (Signed)
Pt very upset states he is not waiting any longer . Apologized for the delay. Left without signing. Notified Dr Lacinda Axon pt left. Able to remove IV as pt going down hall

## 2016-12-13 NOTE — ED Triage Notes (Signed)
Pt c/o cough, congestion, and diarrhea.  Was seen by PCP Thursday and started on Z-Pack

## 2016-12-13 NOTE — ED Provider Notes (Signed)
[facility] DEPT Provider Note   CSN: [id number] Arrival date & time: 12/13/16  [id number]     History   Chief Complaint Chief Complaint  Patient presents with  . Cough  . Nasal Congestion    HPI Jonathan Hudson is a 51 y.o. male.  Cough, nasal congestion, diarrhea for several days. Patient was seen by his primary care doctor and Thursday and started on Zithromax. No neurological deficits or meningeal signs. He is eating and drinking and moving about.  Severity of symptoms mild to moderate.      Past Medical History:  Diagnosis Date  . Hypercholesteremia   . Hypertension     Patient Active Problem List   Diagnosis Date Noted  . Special screening for malignant neoplasms, colon 10/23/2016  . Family hx of colon cancer 10/23/2016  . Range of motion deficit 03/28/2014  . Dehiscence of operative wound 01/24/2014  . Fracture of forearm, closed 01/24/2014  . Closed fracture of shaft of right radius and ulna 12/28/2013  . Forearm fracture 12/28/2013  . Fx radius/ulna shaft-closed 12/28/2013    Past Surgical History:  Procedure Laterality Date  . cyst removed from left wrist     . grafts N/A 10  years  ago   grafts to gums  . OPEN REDUCTION INTERNAL FIXATION (ORIF) DISTAL RADIAL FRACTURE Right 12/28/2013   Procedure: OPEN REDUCTION INTERNAL FIXATION (ORIF) DISTAL RADIAL FRACTURE;  Surgeon: Carole Civil, MD;  Location: [facility];  Service: Orthopedics;  Laterality: Right;  . ORIF ULNAR FRACTURE Right 12/28/2013   Procedure: OPEN REDUCTION INTERNAL FIXATION (ORIF) ULNAR FRACTURE;  Surgeon: Carole Civil, MD;  Location: [facility];  Service: Orthopedics;  Laterality: Right;  . right knee Right teenager   right knee        Home Medications    Prior to Admission medications   Medication Sig Start Date End Date Taking? Authorizing Provider  atorvastatin (LIPITOR) 40 MG tablet Take 40 mg by mouth daily. Reported on 04/15/2016   Yes Historical Provider, MD  azithromycin  (ZITHROMAX) 250 MG tablet Take 250 mg by mouth as directed.   Yes Historical Provider, MD  HYDROcodone-acetaminophen (NORCO/VICODIN) 5-325 MG tablet Take 2 tablets by mouth every 4 (four) hours as needed. 12/05/16  Yes Hollace Kinnier Sofia, PA-C  lisinopril (PRINIVIL,ZESTRIL) 5 MG tablet Take 5 mg by mouth daily. 11/10/13  Yes Historical Provider, MD  ibuprofen (ADVIL,MOTRIN) 800 MG tablet Take 1 tablet (800 mg total) by mouth every 8 (eight) hours as needed. Patient not taking: Reported on 04/15/2016 12/29/13   Carole Civil, MD    Family History No family history on file.  Social History Social History  Substance Use Topics  . Smoking status: Never Smoker  . Smokeless tobacco: Never Used  . Alcohol use Yes     Comment: Occ  beer or wine      Allergies   Patient has no known allergies.   Review of Systems Review of Systems  All other systems reviewed and are negative.    Physical Exam Updated Vital Signs BP 140/79 (BP Location: Left Arm)   Pulse 60   Temp 98.2 F (36.8 C) (Oral)   Resp 14   Ht 6\' 2"  (1.88 m)   Wt 250 lb (113.4 kg)   SpO2 97%   BMI 32.10 kg/m   Physical Exam  Constitutional: He is oriented to person, place, and time. He appears well-developed and well-nourished.  HENT:  Head: Normocephalic and atraumatic.  Eyes: Conjunctivae  are normal.  Neck: Neck supple.  Cardiovascular: Normal rate and regular rhythm.   Pulmonary/Chest: Effort normal and breath sounds normal.  Abdominal: Soft. Bowel sounds are normal.  Musculoskeletal: Normal range of motion.  Neurological: He is alert and oriented to person, place, and time.  Skin: Skin is warm and dry.  Psychiatric: He has a normal mood and affect. His behavior is normal.  Nursing note and vitals reviewed.    ED Treatments / Results  Labs (all labs ordered are listed, but only abnormal results are displayed) Labs Reviewed - No data to display  EKG  EKG Interpretation None       Radiology Dg Chest  2 View  Result Date: 12/13/2016 CLINICAL DATA:  Cough, congestion, and diarrhea. Seen by primary care physician on Thursday is started antibiotics. EXAM: CHEST  2 VIEW COMPARISON:  12/28/2013 FINDINGS: Normal heart size and pulmonary vascularity. No focal airspace disease or consolidation in the lungs. No blunting of costophrenic angles. No pneumothorax. Mediastinal contours appear intact. IMPRESSION: No active cardiopulmonary disease. Electronically Signed   By: Lucienne Capers M.D.   On: 12/13/2016 06:48    Procedures Procedures (including critical care time)  Medications Ordered in ED Medications  sodium chloride 0.9 % bolus 1,000 mL (0 mLs Intravenous Stopped 12/13/16 0931)  ondansetron (ZOFRAN) injection 4 mg (4 mg Intravenous Given 12/13/16 0738)  sodium chloride 0.9 % bolus 1,000 mL (0 mLs Intravenous Stopped 12/13/16 0931)     Initial Impression / Assessment and Plan / ED Course  I have reviewed the triage vital signs and the nursing notes.  Pertinent labs & imaging results that were available during my care of the patient were reviewed by me and considered in my medical decision making (see chart for details).     Patient is nontoxic-appearing. No meningeal signs. He feels much better after 2 L of IV fluids and IV Zofran.  Final Clinical Impressions(s) / ED Diagnoses   Final diagnoses:  Viral syndrome    New Prescriptions Discharge Medication List as of 12/13/2016 10:27 AM       Nat Christen, MD 12/14/16 [phone]

## 2016-12-13 NOTE — ED Notes (Signed)
Pt updated on delay. EDP with critical pt at this time. Pt upset about the amount of time that he has been in EDP

## 2016-12-31 ENCOUNTER — Telehealth (INDEPENDENT_AMBULATORY_CARE_PROVIDER_SITE_OTHER): Payer: Self-pay | Admitting: *Deleted

## 2016-12-31 NOTE — Telephone Encounter (Signed)
Referring MD/PCP: dondiego   Procedure: tcs  Reason/Indication:  Screening, fam hx colon ca  Has patient had this procedure before?  no  If so, when, by whom and where?    Is there a family history of colon cancer?  Yes, father  Who?  What age when diagnosed?    Is patient diabetic?   no      Does patient have prosthetic heart valve or mechanical valve?  no  Do you have a pacemaker?  no  Has patient ever had endocarditis? no  Has patient had joint replacement within last 12 months?  no  Does patient tend to be constipated or take laxatives? no  Does patient have a history of alcohol/drug use?  no  Is patient on Coumadin, Plavix and/or Aspirin? no  Medications: lisinopril 2.5 mg daily, lipitor 20 mg 1.2 tab dailt  Allergies: nkda  Medication Adjustment per Dr Laural Golden:   Procedure date & time: 01/29/17 at 730

## 2016-12-31 NOTE — Telephone Encounter (Signed)
agree

## 2017-01-01 DIAGNOSIS — L0292 Furuncle, unspecified: Secondary | ICD-10-CM

## 2017-01-01 HISTORY — DX: Furuncle, unspecified: L02.92

## 2017-01-29 ENCOUNTER — Encounter (HOSPITAL_COMMUNITY)
Admission: RE | Disposition: A | Discharge status: Home / Self Care | Payer: Self-pay | Source: Ambulatory Visit | Attending: Internal Medicine

## 2017-01-29 ENCOUNTER — Ambulatory Visit (HOSPITAL_COMMUNITY)
Admission: RE | Admit: 2017-01-29 | Discharge: 2017-01-29 | Disposition: A | Discharge status: Home / Self Care | Payer: 59 | Source: Ambulatory Visit | Attending: Internal Medicine | Admitting: Internal Medicine

## 2017-01-29 ENCOUNTER — Encounter (HOSPITAL_COMMUNITY): Payer: Self-pay | Admitting: *Deleted

## 2017-01-29 DIAGNOSIS — Z79899 Other long term (current) drug therapy: Secondary | ICD-10-CM | POA: Insufficient documentation

## 2017-01-29 DIAGNOSIS — I10 Essential (primary) hypertension: Secondary | ICD-10-CM | POA: Insufficient documentation

## 2017-01-29 DIAGNOSIS — Z1211 Encounter for screening for malignant neoplasm of colon: Secondary | ICD-10-CM | POA: Insufficient documentation

## 2017-01-29 DIAGNOSIS — E7800 Pure hypercholesterolemia, unspecified: Secondary | ICD-10-CM | POA: Insufficient documentation

## 2017-01-29 DIAGNOSIS — Z800 Family history of malignant neoplasm of digestive organs: Secondary | ICD-10-CM | POA: Diagnosis not present

## 2017-01-29 HISTORY — DX: Furuncle, unspecified: L02.92

## 2017-01-29 HISTORY — PX: COLONOSCOPY: SHX5424

## 2017-01-29 SURGERY — COLONOSCOPY
Anesthesia: Moderate Sedation

## 2017-01-29 MED ORDER — MEPERIDINE HCL 50 MG/ML IJ SOLN
INTRAMUSCULAR | Status: DC | PRN
  Administered 2017-01-29 (×2): 25 mg via INTRAVENOUS

## 2017-01-29 MED ORDER — MIDAZOLAM HCL 5 MG/5ML IJ SOLN
INTRAMUSCULAR | Status: AC
  Filled 2017-01-29: qty 10

## 2017-01-29 MED ORDER — MIDAZOLAM HCL 5 MG/5ML IJ SOLN
INTRAMUSCULAR | Status: DC | PRN
  Administered 2017-01-29 (×5): 2 mg via INTRAVENOUS

## 2017-01-29 MED ORDER — MEPERIDINE HCL 50 MG/ML IJ SOLN
INTRAMUSCULAR | Status: AC
  Filled 2017-01-29: qty 1

## 2017-01-29 MED ORDER — SODIUM CHLORIDE 0.9 % IV SOLN
INTRAVENOUS | Status: DC
  Administered 2017-01-29: 08:00:00 via INTRAVENOUS

## 2017-01-29 MED ORDER — SIMETHICONE 40 MG/0.6ML PO SUSP
ORAL | Status: DC | PRN
  Administered 2017-01-29: 10:00:00

## 2017-01-29 NOTE — H&P (Signed)
Jonathan Hudson is an 51 y.o. male.   Chief Complaint: Patient is here for colonoscopy. HPI: Patient is 51 year old African-American male who is in for screening colonoscopy. He denies abdominal pain change in bowel habits or rectal bleeding. Family history significant for CRC in father who was 93 at the time of diagnosis and died within few months. 2 paternal uncles also diagnosed and treated for CRC in their 32s. Mother had small bowel cancer which she had surgery and she had recurrence 17 years later to the liver and died in her 56s.  Past Medical History:  Diagnosis Date  . Boils 01/01/2017  . Hypercholesteremia   . Hypertension     Past Surgical History:  Procedure Laterality Date  . cyst removed from left wrist     . grafts N/A 10  years  ago   grafts to gums  . OPEN REDUCTION INTERNAL FIXATION (ORIF) DISTAL RADIAL FRACTURE Right 12/28/2013   Procedure: OPEN REDUCTION INTERNAL FIXATION (ORIF) DISTAL RADIAL FRACTURE;  Surgeon: Carole Civil, MD;  Location: [facility];  Service: Orthopedics;  Laterality: Right;  . ORIF ULNAR FRACTURE Right 12/28/2013   Procedure: OPEN REDUCTION INTERNAL FIXATION (ORIF) ULNAR FRACTURE;  Surgeon: Carole Civil, MD;  Location: [facility];  Service: Orthopedics;  Laterality: Right;  . right knee Right teenager   right knee     History reviewed. No pertinent family history. Social History:  reports that he has never smoked. He has never used smokeless tobacco. He reports that he drinks alcohol. He reports that he does not use drugs.  Allergies: No Known Allergies  Medications Prior to Admission  Medication Sig Dispense Refill  . atorvastatin (LIPITOR) 40 MG tablet Take 20 mg by mouth daily. Reported on 04/15/2016    . lisinopril (PRINIVIL,ZESTRIL) 5 MG tablet Take 5 mg by mouth daily.    . Multiple Vitamin (MULTIVITAMIN WITH MINERALS) TABS tablet Take 1 tablet by mouth daily.    . sildenafil (VIAGRA) 100 MG tablet Take 100 mg by mouth daily as  needed for erectile dysfunction.  5  . HYDROcodone-acetaminophen (NORCO/VICODIN) 5-325 MG tablet Take 2 tablets by mouth every 4 (four) hours as needed. (Patient not taking: Reported on 01/26/2017) 12 tablet 0  . ibuprofen (ADVIL,MOTRIN) 800 MG tablet Take 1 tablet (800 mg total) by mouth every 8 (eight) hours as needed. (Patient not taking: Reported on 04/15/2016) 90 tablet 5    No results found for this or any previous visit (from the past 48 hour(s)). No results found.  ROS  Blood pressure (!) 142/91, pulse (!) 59, temperature 98.7 F (37.1 C), temperature source Oral, resp. rate 12, height 6' 2.5" (1.892 m), weight 255 lb (115.7 kg), SpO2 98 %. Physical Exam  Constitutional: He appears well-developed and well-nourished.  HENT:  Mouth/Throat: Oropharynx is clear and moist.  Eyes: Conjunctivae are normal. No scleral icterus.  Neck: No thyromegaly present.  Cardiovascular: Normal rate, regular rhythm and normal heart sounds.   No murmur heard. Respiratory: Effort normal and breath sounds normal.  GI: Soft. He exhibits no distension and no mass. There is no tenderness.  Musculoskeletal: He exhibits no edema.  Lymphadenopathy:    He has no cervical adenopathy.  Neurological: He is alert.  Skin: Skin is warm and dry.     Assessment/Plan High risk screening colonoscopy. Family history of CRC in father at late onset in 2 paternal uncles.  Hildred Laser, MD 01/29/2017, 9:25 AM

## 2017-01-29 NOTE — Op Note (Signed)
[facility] Patient Name: Jonathan Hudson Procedure Date: 01/29/2017 9:14 AM MRN: [id number] Date of Birth: 1966/05/11 Attending MD: Hildred Laser , MD CSN: [id number] Age: 51 Admit Type: Outpatient Procedure:                Colonoscopy Indications:              Screening in patient at increased risk: Colorectal                            cancer in father 35 or older, Colon cancer                            screening in patient at increased risk: Family                            history of colorectal cancer in multiple 2nd degree                            relatives Providers:                Hildred Laser, MD, Otis Peak B. Sharon Seller, RN, Aram Candela Referring MD:             Ralene Bathe. Dondiego, MD Medicines:                Meperidine 50 mg IV, Midazolam 10 mg IV Complications:            No immediate complications. Estimated Blood Loss:     Estimated blood loss: none. Procedure:                Pre-Anesthesia Assessment:                           - Prior to the procedure, a History and Physical                            was performed, and patient medications and                            allergies were reviewed. The patient's tolerance of                            previous anesthesia was also reviewed. The risks                            and benefits of the procedure and the sedation                            options and risks were discussed with the patient.                            All questions were answered, and informed consent  was obtained. Prior Anticoagulants: The patient has                            taken no previous anticoagulant or antiplatelet                            agents. ASA Grade Assessment: II - A patient with                            mild systemic disease. After reviewing the risks                            and benefits, the patient was deemed in                            satisfactory  condition to undergo the procedure.                           After obtaining informed consent, the colonoscope                            was passed under direct vision. Throughout the                            procedure, the patient's blood pressure, pulse, and                            oxygen saturations were monitored continuously. The                            was introduced through the anus and advanced to the                            the cecum, identified by appendiceal orifice and                            ileocecal valve. The colonoscopy was performed                            without difficulty. The patient tolerated the                            procedure well. The quality of the bowel                            preparation was fair. The ileocecal valve,                            appendiceal orifice, and rectum were photographed. Scope In: 9:37:48 AM Scope Out: 9:57:12 AM Scope Withdrawal Time: 0 hours 10 minutes 50 seconds  Total Procedure Duration: 0 hours 19 minutes 24 seconds  Findings:      The perianal and digital rectal examinations were normal.      The colon (entire examined portion) appeared  normal.      The retroflexed view of the distal rectum and anal verge was normal and       showed no anal or rectal abnormalities. Impression:               - Preparation of the colon was fair.                           - The entire examined colon is normal.                           - No specimens collected. Moderate Sedation:      Moderate (conscious) sedation was administered by the endoscopy nurse       and supervised by the endoscopist. The following parameters were       monitored: oxygen saturation, heart rate, blood pressure, CO2       capnography and response to care. Total physician intraservice time was       26 minutes. Recommendation:           - Patient has a contact number available for                            emergencies. The signs and symptoms of  potential                            delayed complications were discussed with the                            patient. Return to normal activities tomorrow.                            Written discharge instructions were provided to the                            patient.                           - Resume previous diet today.                           - Continue present medications.                           - Repeat colonoscopy in 5 years for screening                            purposes. Procedure Code(s):        --- Professional ---                           [phone], Colonoscopy, flexible; diagnostic, including                            collection of specimen(s) by brushing or washing,                            when performed (separate procedure)  44010, Moderate sedation services provided by the                            same physician or other qualified health care                            professional performing the diagnostic or                            therapeutic service that the sedation supports,                            requiring the presence of an independent trained                            observer to assist in the monitoring of the                            patient's level of consciousness and physiological                            status; initial 15 minutes of intraservice time,                            patient age 36 years or older                           [phone], Moderate sedation services; each additional                            15 minutes intraservice time Diagnosis Code(s):        --- Professional ---                           Z80.0, Family history of malignant neoplasm of                            digestive organs CPT copyright 2016 [employer]. All rights reserved. The codes documented in this report are preliminary and upon coder review may  be revised to meet current compliance requirements. Hildred Laser, MD Hildred Laser, MD 01/29/2017 10:04:18 AM This report has been signed electronically. Number of Addenda: 0

## 2017-01-29 NOTE — Discharge Instructions (Signed)
° °  Colonoscopy, Adult, Care After This sheet gives you information about how to care for yourself after your procedure. Your health care provider may also give you more specific instructions. If you have problems or questions, contact your health care provider. What can I expect after the procedure? After the procedure, it is common to have:  A small amount of blood in your stool for 24 hours after the procedure.  Some gas.  Mild abdominal cramping or bloating. Follow these instructions at home: General instructions    For the first 24 hours after the procedure:  Do not drive or use machinery.  Do not sign important documents.  Do not drink alcohol.  Do your regular daily activities at a slower pace than normal.  Eat soft, easy-to-digest foods.  Rest often.  Take over-the-counter or prescription medicines only as told by your health care provider.  It is up to you to get the results of your procedure. Ask your health care provider, or the department performing the procedure, when your results will be ready. Relieving cramping and bloating   Try walking around when you have cramps or feel bloated.  Apply heat to your abdomen as told by your health care provider. Use a heat source that your health care provider recommends, such as a moist heat pack or a heating pad.  Place a towel between your skin and the heat source.  Leave the heat on for 20-30 minutes.  Remove the heat if your skin turns bright red. This is especially important if you are unable to feel pain, heat, or cold. You may have a greater risk of getting burned. Eating and drinking   Drink enough fluid to keep your urine clear or pale yellow.  Resume your normal diet as instructed by your health care provider. Avoid heavy or fried foods that are hard to digest.  Avoid drinking alcohol for as long as instructed by your health care provider. Contact a health care provider if:  You have blood in your stool  2-3 days after the procedure. Get help right away if:  You have more than a small spotting of blood in your stool.  You pass large blood clots in your stool.  Your abdomen is swollen.  You have nausea or vomiting.  You have a fever.  You have increasing abdominal pain that is not relieved with medicine. This information is not intended to replace advice given to you by your health care provider. Make sure you discuss any questions you have with your health care provider. Document Released: 06/03/2004 Document Revised: 07/14/2016 Document Reviewed: 01/01/2016 Elsevier Interactive Patient Education  2017 [place] usual medications and diet. No driving for 24 hours. Next screening colonoscopy in 5 years.

## 2017-02-02 ENCOUNTER — Encounter (HOSPITAL_COMMUNITY): Payer: Self-pay | Admitting: Internal Medicine

## 2017-12-23 ENCOUNTER — Emergency Department (HOSPITAL_COMMUNITY): Payer: 59

## 2017-12-23 ENCOUNTER — Emergency Department (HOSPITAL_COMMUNITY)
Admission: EM | Admit: 2017-12-23 | Discharge: 2017-12-23 | Disposition: A | Discharge status: Home / Self Care | Payer: 59 | Attending: Emergency Medicine | Admitting: Emergency Medicine

## 2017-12-23 ENCOUNTER — Encounter (HOSPITAL_COMMUNITY): Payer: Self-pay | Admitting: Emergency Medicine

## 2017-12-23 ENCOUNTER — Other Ambulatory Visit: Payer: Self-pay

## 2017-12-23 DIAGNOSIS — I159 Secondary hypertension, unspecified: Secondary | ICD-10-CM | POA: Diagnosis not present

## 2017-12-23 DIAGNOSIS — Z79899 Other long term (current) drug therapy: Secondary | ICD-10-CM | POA: Insufficient documentation

## 2017-12-23 DIAGNOSIS — R519 Headache, unspecified: Secondary | ICD-10-CM

## 2017-12-23 DIAGNOSIS — R51 Headache: Secondary | ICD-10-CM | POA: Diagnosis present

## 2017-12-23 MED ORDER — LISINOPRIL 5 MG PO TABS
5.0000 mg | ORAL_TABLET | Freq: Once | ORAL | Status: AC
  Administered 2017-12-23: 5 mg via ORAL
  Filled 2017-12-23: qty 1

## 2017-12-23 MED ORDER — LISINOPRIL 10 MG PO TABS: 10.0000 mg | ORAL_TABLET | Freq: Every day | ORAL | 0 refills | Status: DC

## 2017-12-23 NOTE — ED Provider Notes (Signed)
[facility] EMERGENCY DEPARTMENT Provider Note   CSN: [id number] Arrival date & time: 12/23/17  7829     History   Chief Complaint Chief Complaint  Patient presents with  . Headache    HPI Jonathan Hudson is a 52 y.o. male.   Headache   This is a new problem. The current episode started more than 2 days ago. The problem occurs constantly. The problem has not changed since onset.The headache is associated with nothing. The quality of the pain is described as dull. The pain is mild. Pertinent negatives include no anorexia, no orthopnea, no palpitations and no syncope. He has tried nothing for the symptoms.    Past Medical History:  Diagnosis Date  . Boils 01/01/2017  . Hypercholesteremia   . Hypertension     Patient Active Problem List   Diagnosis Date Noted  . Special screening for malignant neoplasms, colon 10/23/2016  . Family hx of colon cancer 10/23/2016  . Range of motion deficit 03/28/2014  . Dehiscence of operative wound 01/24/2014  . Fracture of forearm, closed 01/24/2014  . Closed fracture of shaft of right radius and ulna 12/28/2013  . Forearm fracture 12/28/2013  . Fx radius/ulna shaft-closed 12/28/2013    Past Surgical History:  Procedure Laterality Date  . COLONOSCOPY N/A 01/29/2017   Procedure: COLONOSCOPY;  Surgeon: Rogene Houston, MD;  Location: [facility];  Service: Endoscopy;  Laterality: N/A;  730  . cyst removed from left wrist     . grafts N/A 10  years  ago   grafts to gums  . OPEN REDUCTION INTERNAL FIXATION (ORIF) DISTAL RADIAL FRACTURE Right 12/28/2013   Procedure: OPEN REDUCTION INTERNAL FIXATION (ORIF) DISTAL RADIAL FRACTURE;  Surgeon: Carole Civil, MD;  Location: [facility] ORS;  Service: Orthopedics;  Laterality: Right;  . ORIF ULNAR FRACTURE Right 12/28/2013   Procedure: OPEN REDUCTION INTERNAL FIXATION (ORIF) ULNAR FRACTURE;  Surgeon: Carole Civil, MD;  Location: [facility] ORS;  Service: Orthopedics;  Laterality: Right;  . right  knee Right teenager   right knee        Home Medications    Prior to Admission medications   Medication Sig Start Date End Date Taking? Authorizing Provider  atorvastatin (LIPITOR) 40 MG tablet Take 20 mg by mouth daily. Reported on 04/15/2016    [provider]  lisinopril (PRINIVIL,ZESTRIL) 10 MG tablet Take 1 tablet (10 mg total) by mouth daily. 12/23/17   Sonji Starkes, Corene Cornea, MD  Multiple Vitamin (MULTIVITAMIN WITH MINERALS) TABS tablet Take 1 tablet by mouth daily.    [provider]  sildenafil (VIAGRA) 100 MG tablet Take 100 mg by mouth daily as needed for erectile dysfunction. 01/13/17   [provider]    Family History No family history on file.  Social History Social History   Tobacco Use  . Smoking status: Never Smoker  . Smokeless tobacco: Never Used  Substance Use Topics  . Alcohol use: Yes    Comment: Occ  beer or wine   . Drug use: No     Allergies   Patient has no known allergies.   Review of Systems Review of Systems  Cardiovascular: Negative for palpitations, orthopnea and syncope.  Gastrointestinal: Negative for anorexia.  Neurological: Positive for headaches.  All other systems reviewed and are negative.    Physical Exam Updated Vital Signs BP (!) 159/103 (BP Location: Left Arm)   Pulse (!) 57   Temp 97.7 F (36.5 C) (Oral)   Resp 16  SpO2 99%   Physical Exam  Constitutional: He is oriented to person, place, and time. He appears well-developed and well-nourished.  HENT:  Head: Normocephalic and atraumatic.  Eyes: EOM are normal. Pupils are equal, round, and reactive to light.  Neck: Normal range of motion.  Cardiovascular: Normal rate.  Pulmonary/Chest: Effort normal. No respiratory distress.  Abdominal: Soft. He exhibits no distension. There is no tenderness.  Musculoskeletal: Normal range of motion.  Neurological: He is alert and oriented to person, place, and time. He has normal strength. No cranial nerve  deficit. Coordination normal.  Skin: Skin is warm and dry.  Nursing note and vitals reviewed.    ED Treatments / Results  Labs (all labs ordered are listed, but only abnormal results are displayed) Labs Reviewed - No data to display  EKG  EKG Interpretation None       Radiology Ct Head Wo Contrast  Result Date: 12/23/2017 CLINICAL DATA:  Headaches. EXAM: CT HEAD WITHOUT CONTRAST TECHNIQUE: Contiguous axial images were obtained from the base of the skull through the vertex without intravenous contrast. COMPARISON:  None. FINDINGS: Brain: Mild diffuse cortical atrophy is noted. No mass effect or midline shift is noted. Ventricular size is within normal limits. There is no evidence of mass lesion, hemorrhage or acute infarction. Vascular: No hyperdense vessel or unexpected calcification. Skull: Normal. Negative for fracture or focal lesion. Sinuses/Orbits: Mild bilateral ethmoid sinusitis is noted. Other: None. IMPRESSION: Mild diffuse cortical atrophy. No acute intracranial abnormality seen. Electronically Signed   By: Marijo Conception, M.D.   On: 12/23/2017 21:47    Procedures Procedures (including critical care time)  Medications Ordered in ED Medications  lisinopril (PRINIVIL,ZESTRIL) tablet 5 mg (5 mg Oral Given 12/23/17 2102)     Initial Impression / Assessment and Plan / ED Course  I have reviewed the triage vital signs and the nursing notes.  Pertinent labs & imaging results that were available during my care of the patient were reviewed by me and considered in my medical decision making (see chart for details).     Suspect HA because of BP. Will restart medications and ensure HA improving.   HA improved. Ready for dc. Rx for lisinopril given. Will fu w/ pcp for same.   Final Clinical Impressions(s) / ED Diagnoses   Final diagnoses:  Secondary hypertension  Nonintractable headache, unspecified chronicity pattern, unspecified headache type    ED Discharge Orders          Ordered    lisinopril (PRINIVIL,ZESTRIL) 10 MG tablet  Daily     12/23/17 2225       Claudean Leavelle, Corene Cornea, MD 12/23/17 2250

## 2017-12-23 NOTE — ED Triage Notes (Addendum)
Pt c/o several headaches over the last couple of day and states his blood pressure at home was 220/118. Pt also c/o being nausea and dizziness.

## 2018-02-03 IMAGING — DX DG CHEST 2V
2 series · 2 of 2 positions shown · non-contrast
Comparison: 12/28/2013

CLINICAL DATA: Cough, congestion, and diarrhea. Seen by primary
care physician on [REDACTED] is started antibiotics.

EXAM:
CHEST  2 VIEW

[chest pa]
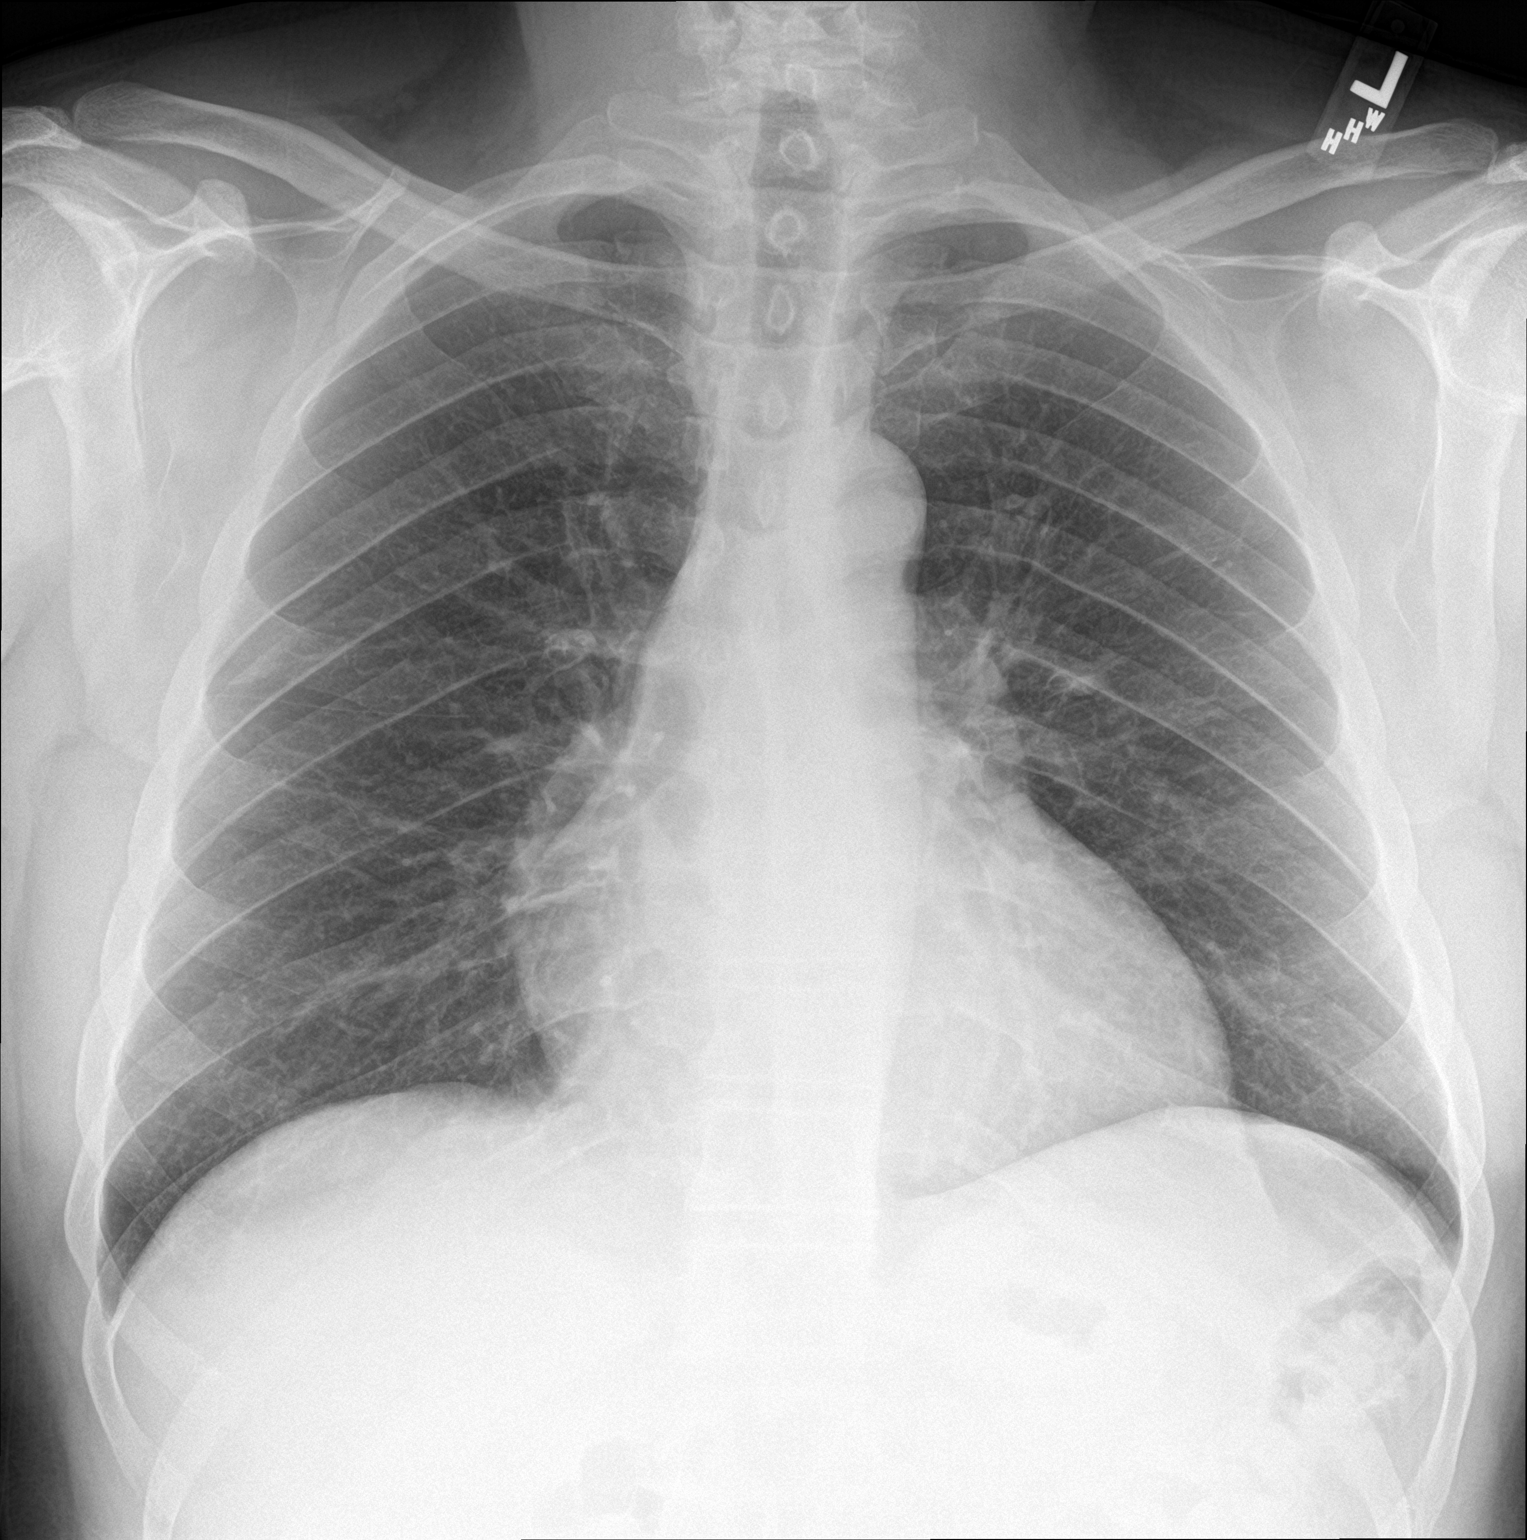

[chest lat]
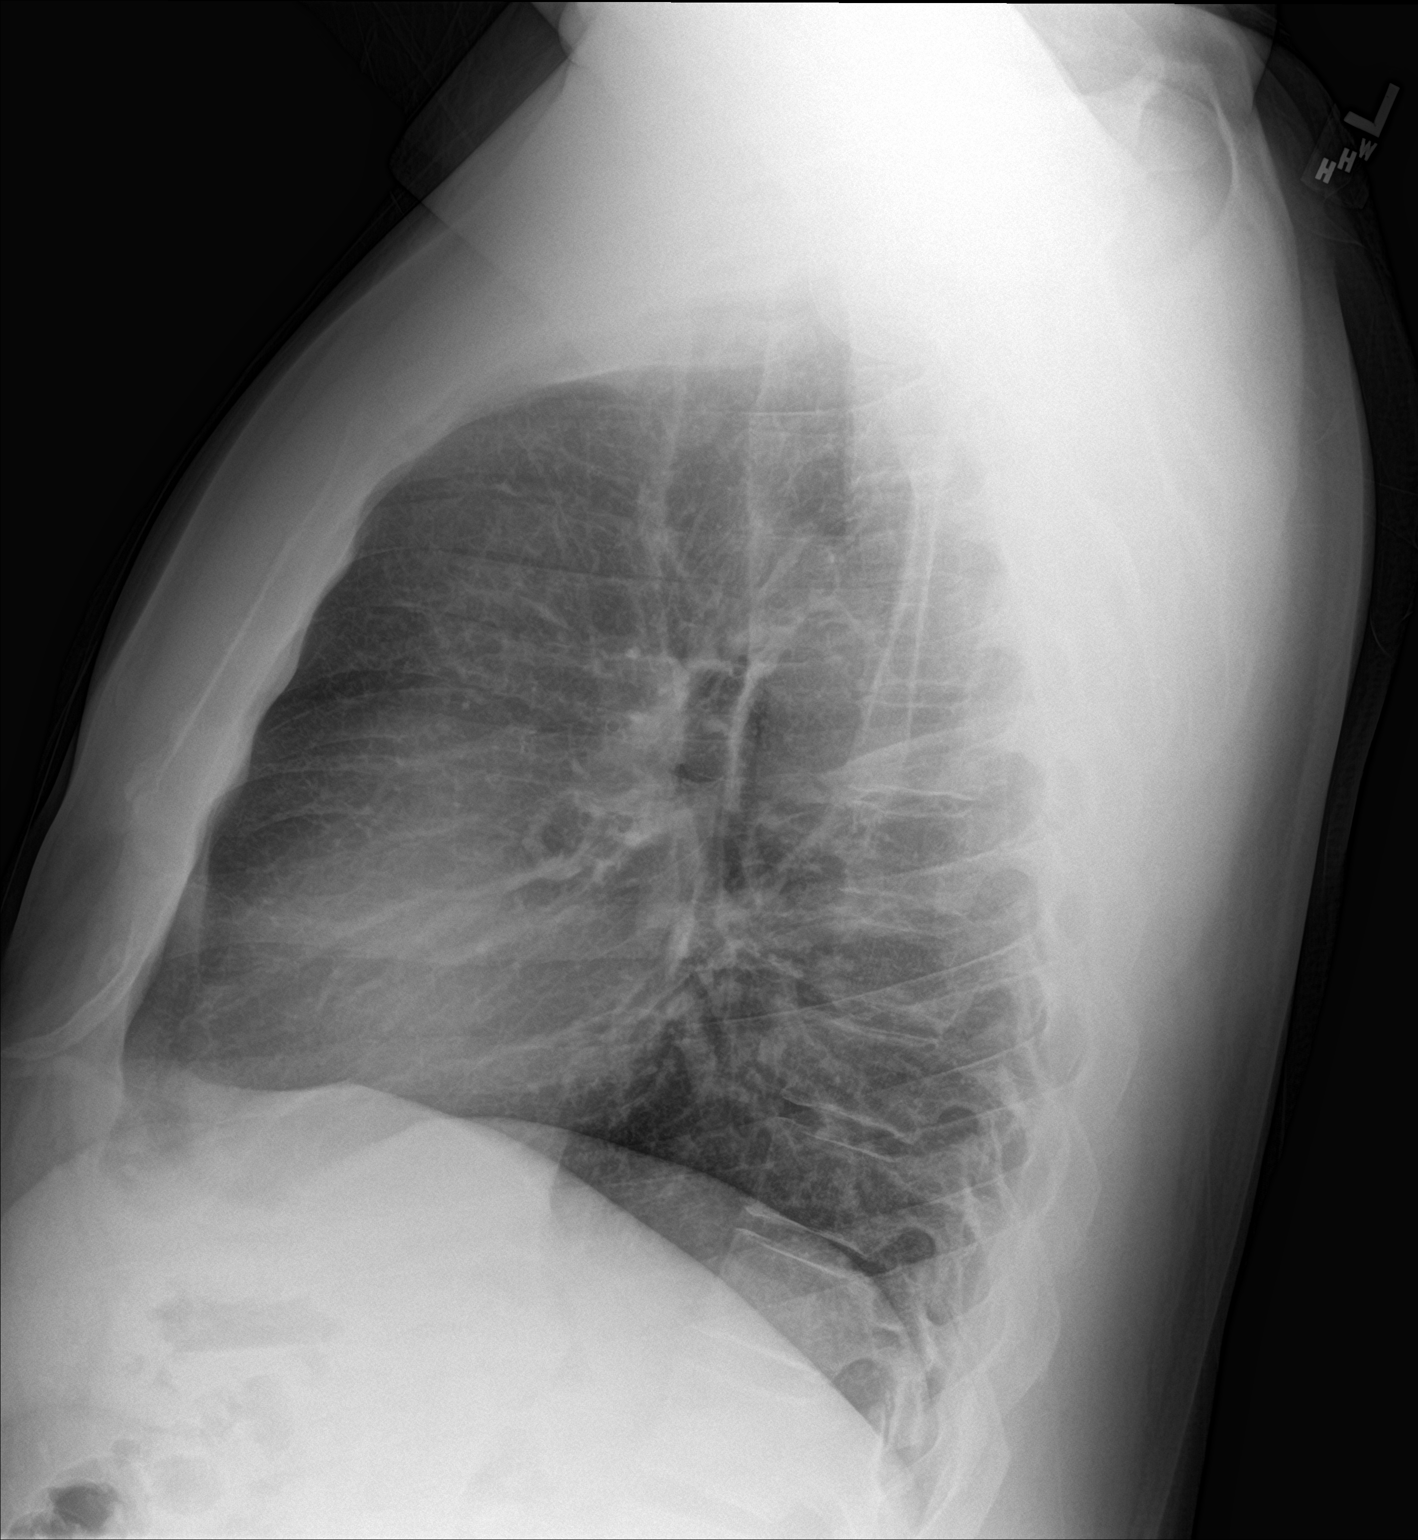

[2 of 2 positions shown; findings below may reference images not displayed]

FINDINGS: Normal heart size and pulmonary vascularity. No focal airspace
disease or consolidation in the lungs. No blunting of costophrenic
angles. No pneumothorax. Mediastinal contours appear intact.
IMPRESSION: No active cardiopulmonary disease.

## 2018-07-21 ENCOUNTER — Ambulatory Visit: Payer: Self-pay | Admitting: Orthopedic Surgery

## 2018-07-21 ENCOUNTER — Encounter: Payer: Self-pay | Admitting: Orthopedic Surgery

## 2018-08-19 ENCOUNTER — Emergency Department (HOSPITAL_COMMUNITY)
Admission: EM | Admit: 2018-08-19 | Discharge: 2018-08-19 | Disposition: A | Discharge status: Home / Self Care | Payer: 59

## 2021-02-27 ENCOUNTER — Other Ambulatory Visit (HOSPITAL_COMMUNITY): Payer: Self-pay | Admitting: Internal Medicine

## 2021-02-27 DIAGNOSIS — N63 Unspecified lump in unspecified breast: Secondary | ICD-10-CM

## 2021-03-26 ENCOUNTER — Other Ambulatory Visit: Payer: Self-pay

## 2021-03-26 ENCOUNTER — Ambulatory Visit (HOSPITAL_COMMUNITY)
Admission: RE | Admit: 2021-03-26 | Discharge: 2021-03-26 | Disposition: A | Discharge status: Home / Self Care | Payer: 59 | Source: Ambulatory Visit | Attending: Internal Medicine | Admitting: Internal Medicine

## 2021-03-26 DIAGNOSIS — N63 Unspecified lump in unspecified breast: Secondary | ICD-10-CM | POA: Insufficient documentation

## 2021-06-13 ENCOUNTER — Other Ambulatory Visit (HOSPITAL_COMMUNITY): Payer: Self-pay | Admitting: Family Medicine

## 2021-06-13 DIAGNOSIS — N63 Unspecified lump in unspecified breast: Secondary | ICD-10-CM

## 2021-06-25 ENCOUNTER — Other Ambulatory Visit (HOSPITAL_COMMUNITY): Payer: Self-pay | Admitting: Family Medicine

## 2021-06-25 ENCOUNTER — Ambulatory Visit (HOSPITAL_COMMUNITY)
Admission: RE | Admit: 2021-06-25 | Discharge: 2021-06-25 | Disposition: A | Discharge status: Home / Self Care | Payer: 59 | Source: Ambulatory Visit | Attending: Family Medicine | Admitting: Family Medicine

## 2021-06-25 ENCOUNTER — Other Ambulatory Visit: Payer: Self-pay

## 2021-06-25 DIAGNOSIS — N6310 Unspecified lump in the right breast, unspecified quadrant: Secondary | ICD-10-CM

## 2021-06-25 DIAGNOSIS — N63 Unspecified lump in unspecified breast: Secondary | ICD-10-CM | POA: Diagnosis not present

## 2021-06-27 ENCOUNTER — Other Ambulatory Visit (HOSPITAL_COMMUNITY): Payer: Self-pay | Admitting: Family Medicine

## 2021-06-27 ENCOUNTER — Other Ambulatory Visit: Payer: Self-pay

## 2021-06-27 ENCOUNTER — Encounter (HOSPITAL_COMMUNITY): Payer: Self-pay

## 2021-06-27 ENCOUNTER — Ambulatory Visit (HOSPITAL_COMMUNITY)
Admission: RE | Admit: 2021-06-27 | Discharge: 2021-06-27 | Disposition: A | Discharge status: Home / Self Care | Payer: 59 | Source: Ambulatory Visit | Attending: Family Medicine | Admitting: Family Medicine

## 2021-06-27 DIAGNOSIS — R928 Other abnormal and inconclusive findings on diagnostic imaging of breast: Secondary | ICD-10-CM

## 2021-06-27 DIAGNOSIS — N6311 Unspecified lump in the right breast, upper outer quadrant: Secondary | ICD-10-CM | POA: Diagnosis not present

## 2021-06-27 DIAGNOSIS — N6310 Unspecified lump in the right breast, unspecified quadrant: Secondary | ICD-10-CM

## 2021-06-27 MED ORDER — LIDOCAINE-EPINEPHRINE (PF) 2 %-1:200000 IJ SOLN
INTRAMUSCULAR | Status: AC
  Administered 2021-06-27: 10 mL
  Filled 2021-06-27: qty 10

## 2021-06-27 MED ORDER — SODIUM BICARBONATE 4.2 % IV SOLN
INTRAVENOUS | Status: AC
  Administered 2021-06-27: 2 mL
  Filled 2021-06-27: qty 10

## 2021-06-27 MED ORDER — LIDOCAINE HCL (PF) 1 % IJ SOLN
INTRAMUSCULAR | Status: AC
  Administered 2021-06-27: 5 mL
  Filled 2021-06-27: qty 5

## 2021-06-27 NOTE — Sedation Documentation (Signed)
PT tolerated right breast biopsy well today with NAD noted. PT verbalized understanding of discharge instructions. PT ambulated back to the mammogram area this time. 

## 2021-06-28 LAB — SURGICAL PATHOLOGY

## 2021-12-16 ENCOUNTER — Ambulatory Visit: Payer: 59 | Admitting: Internal Medicine

## 2022-01-27 ENCOUNTER — Encounter (INDEPENDENT_AMBULATORY_CARE_PROVIDER_SITE_OTHER): Payer: Self-pay | Admitting: *Deleted

## 2022-02-19 ENCOUNTER — Encounter (INDEPENDENT_AMBULATORY_CARE_PROVIDER_SITE_OTHER): Payer: Self-pay | Admitting: *Deleted

## 2022-02-19 ENCOUNTER — Telehealth (INDEPENDENT_AMBULATORY_CARE_PROVIDER_SITE_OTHER): Payer: Self-pay | Admitting: *Deleted

## 2022-02-19 MED ORDER — PEG 3350-KCL-NA BICARB-NACL 420 G PO SOLR: 4000.0000 mL | Freq: Once | ORAL | 0 refills | Status: AC

## 2022-02-19 NOTE — Telephone Encounter (Signed)
Patient needs trilyte 

## 2022-02-20 ENCOUNTER — Other Ambulatory Visit (INDEPENDENT_AMBULATORY_CARE_PROVIDER_SITE_OTHER): Payer: Self-pay

## 2022-02-20 DIAGNOSIS — Z1211 Encounter for screening for malignant neoplasm of colon: Secondary | ICD-10-CM

## 2022-03-13 ENCOUNTER — Telehealth (INDEPENDENT_AMBULATORY_CARE_PROVIDER_SITE_OTHER): Payer: Self-pay

## 2022-03-13 MED ORDER — PEG 3350-KCL-NA BICARB-NACL 420 G PO SOLR: 4000.0000 mL | ORAL | 0 refills | Status: DC

## 2022-03-13 NOTE — Telephone Encounter (Signed)
Kee Drudge Ann Yarely Bebee, CMA  ?

## 2022-04-03 ENCOUNTER — Encounter (HOSPITAL_COMMUNITY): Payer: Self-pay | Admitting: Internal Medicine

## 2022-04-03 ENCOUNTER — Ambulatory Visit (HOSPITAL_COMMUNITY): Payer: 59 | Admitting: Anesthesiology

## 2022-04-03 ENCOUNTER — Other Ambulatory Visit: Payer: Self-pay

## 2022-04-03 ENCOUNTER — Ambulatory Visit (HOSPITAL_BASED_OUTPATIENT_CLINIC_OR_DEPARTMENT_OTHER): Payer: 59 | Admitting: Anesthesiology

## 2022-04-03 ENCOUNTER — Ambulatory Visit (HOSPITAL_COMMUNITY)
Admission: RE | Admit: 2022-04-03 | Discharge: 2022-04-03 | Disposition: A | Discharge status: Home / Self Care | Payer: 59 | Attending: Internal Medicine | Admitting: Internal Medicine

## 2022-04-03 ENCOUNTER — Encounter (HOSPITAL_COMMUNITY)
Admission: RE | Disposition: A | Discharge status: Home / Self Care | Payer: Self-pay | Source: Home / Self Care | Attending: Internal Medicine

## 2022-04-03 DIAGNOSIS — Z1211 Encounter for screening for malignant neoplasm of colon: Secondary | ICD-10-CM | POA: Insufficient documentation

## 2022-04-03 DIAGNOSIS — D123 Benign neoplasm of transverse colon: Secondary | ICD-10-CM | POA: Diagnosis not present

## 2022-04-03 DIAGNOSIS — I10 Essential (primary) hypertension: Secondary | ICD-10-CM | POA: Diagnosis not present

## 2022-04-03 DIAGNOSIS — K635 Polyp of colon: Secondary | ICD-10-CM | POA: Diagnosis not present

## 2022-04-03 DIAGNOSIS — Z800 Family history of malignant neoplasm of digestive organs: Secondary | ICD-10-CM

## 2022-04-03 DIAGNOSIS — K644 Residual hemorrhoidal skin tags: Secondary | ICD-10-CM | POA: Insufficient documentation

## 2022-04-03 HISTORY — PX: COLONOSCOPY WITH PROPOFOL: SHX5780

## 2022-04-03 HISTORY — PX: POLYPECTOMY: SHX5525

## 2022-04-03 HISTORY — PX: HEMOSTASIS CLIP PLACEMENT: SHX6857

## 2022-04-03 LAB — HM COLONOSCOPY

## 2022-04-03 SURGERY — COLONOSCOPY WITH PROPOFOL
Anesthesia: General

## 2022-04-03 MED ORDER — PROPOFOL 10 MG/ML IV BOLUS
INTRAVENOUS | Status: DC | PRN
  Administered 2022-04-03: 120 mg via INTRAVENOUS
  Administered 2022-04-03: 30 mg via INTRAVENOUS

## 2022-04-03 MED ORDER — PROPOFOL 500 MG/50ML IV EMUL
INTRAVENOUS | Status: DC | PRN
  Administered 2022-04-03: 200 ug/kg/min via INTRAVENOUS

## 2022-04-03 MED ORDER — LIDOCAINE 2% (20 MG/ML) 5 ML SYRINGE
INTRAMUSCULAR | Status: DC | PRN
  Administered 2022-04-03: 50 mg via INTRAVENOUS

## 2022-04-03 MED ORDER — LACTATED RINGERS IV SOLN: INTRAVENOUS | Status: DC

## 2022-04-03 MED ORDER — EPHEDRINE SULFATE-NACL 50-0.9 MG/10ML-% IV SOSY
PREFILLED_SYRINGE | INTRAVENOUS | Status: DC | PRN
  Administered 2022-04-03: 10 mg via INTRAVENOUS

## 2022-04-03 MED ORDER — STERILE WATER FOR IRRIGATION IR SOLN
Status: DC | PRN
  Administered 2022-04-03: 120 mL

## 2022-04-03 NOTE — Transfer of Care (Signed)
Immediate Anesthesia Transfer of Care Note  Patient: Jonathan Hudson  Procedure(s) Performed: COLONOSCOPY WITH PROPOFOL POLYPECTOMY HEMOSTASIS CLIP PLACEMENT  Patient Location: Endoscopy Unit  Anesthesia Type:MAC  Level of Consciousness: sedated and responds to stimulation  Airway & Oxygen Therapy: Patient Spontanous Breathing  Post-op Assessment: Report given to RN and Post -op Vital signs reviewed and stable  Post vital signs: Reviewed and stable  Last Vitals:  Vitals Value Taken Time  BP    Temp    Pulse    Resp    SpO2      Last Pain:  Vitals:   04/03/22 0735  TempSrc:   PainSc: 0-No pain      Patients Stated Pain Goal: 3 (98/02/21 7981)  Complications: No notable events documented.

## 2022-04-03 NOTE — H&P (Signed)
Jonathan Hudson is an 56 y.o. male.   Chief Complaint: Patient is here for colonoscopy HPI: Patient is 56 year old African-American male who is here for high rescreening colonoscopy.  Last exam in March 2018 was normal.  He denies abdominal pain change in bowel habits or rectal bleeding.  He does not take aspirin or anticoagulants. Family history is positive for colon cancer in father who was in his 77s and died within 2 weeks.  2 of his paternal uncles also had colon cancer in their 23s.  His mother had small bowel cancer.  On the mother side there is history of prostate and liver cancer.   Past Medical History:  Diagnosis Date   Boils 01/01/2017   Hypercholesteremia    Hypertension     Past Surgical History:  Procedure Laterality Date   COLONOSCOPY N/A 01/29/2017   Procedure: COLONOSCOPY;  Surgeon: Rogene Houston, MD;  Location: [facility];  Service: Endoscopy;  Laterality: N/A;  730   cyst removed from left wrist      grafts N/A 10  years  ago   grafts to gums   OPEN REDUCTION INTERNAL FIXATION (ORIF) DISTAL RADIAL FRACTURE Right 12/28/2013   Procedure: OPEN REDUCTION INTERNAL FIXATION (ORIF) DISTAL RADIAL FRACTURE;  Surgeon: Carole Civil, MD;  Location: [facility];  Service: Orthopedics;  Laterality: Right;   ORIF ULNAR FRACTURE Right 12/28/2013   Procedure: OPEN REDUCTION INTERNAL FIXATION (ORIF) ULNAR FRACTURE;  Surgeon: Carole Civil, MD;  Location: [facility];  Service: Orthopedics;  Laterality: Right;   right knee Right teenager   right knee     History reviewed. No pertinent family history. Social History:  reports that he has never smoked. He has never used smokeless tobacco. He reports current alcohol use. He reports that he does not use drugs.  Allergies: No Known Allergies  Medications Prior to Admission  Medication Sig Dispense Refill   amLODipine (NORVASC) 10 MG tablet Take 10 mg by mouth daily.     atorvastatin (LIPITOR) 40 MG tablet Take 40 mg by mouth  daily.     lansoprazole (PREVACID) 15 MG capsule Take 15 mg by mouth every morning.     Multiple Vitamin (MULTIVITAMIN WITH MINERALS) TABS tablet Take 1 tablet by mouth daily.     olmesartan (BENICAR) 40 MG tablet Take 40 mg by mouth daily.     sildenafil (VIAGRA) 100 MG tablet Take 100 mg by mouth daily as needed for erectile dysfunction.  5   polyethylene glycol-electrolytes (TRILYTE) 420 g solution Take 4,000 mLs by mouth as directed. 4000 mL 0    No results found for this or any previous visit (from the past 48 hour(s)). No results found.  Review of Systems  Blood pressure 134/87, pulse (!) 54, temperature 98.5 F (36.9 C), temperature source Oral, resp. rate 12, height '6\' 2"'$  (1.88 m), weight 107.5 kg, SpO2 98 %. Physical Exam HENT:     Mouth/Throat:     Mouth: Mucous membranes are moist.     Pharynx: Oropharynx is clear.  Eyes:     General: No scleral icterus.    Conjunctiva/sclera: Conjunctivae normal.  Cardiovascular:     Rate and Rhythm: Normal rate and regular rhythm.     Heart sounds: Normal heart sounds. No murmur heard. Pulmonary:     Effort: Pulmonary effort is normal.     Breath sounds: Normal breath sounds.  Abdominal:     General: There is no distension.     Palpations: Abdomen  is soft. There is no mass.     Tenderness: There is no abdominal tenderness.  Musculoskeletal:        General: No swelling.     Cervical back: Neck supple. No rigidity.  Skin:    General: Skin is warm and dry.  Neurological:     Mental Status: He is alert.     Assessment/Plan  High risk screening colonoscopy. Family history of colon cancer in first degree relative in multiple second-degree relatives. Family history also significant for other forms of cancer.  Hildred Laser, MD 04/03/2022, 7:29 AM

## 2022-04-03 NOTE — Op Note (Signed)
[facility] Patient Name: Jonathan Hudson Procedure Date: 04/03/2022 7:11 AM MRN: [id number] Date of Birth: 1966/01/06 Attending MD: Hildred Laser , MD CSN: [id number] Age: 56 Admit Type: Outpatient Procedure:                Colonoscopy Indications:              Screening patient at increased risk: Family history                            of 1st-degree relative with colorectal cancer at                            age 22 years (or older), Colon cancer screening in                            patient at increased risk: Family history of                            colorectal cancer in multiple 2nd degree relatives Providers:                Hildred Laser, MD, Lambert Mody, Charlsie Quest                            Theda Sers RN, RN Referring MD:              Medicines:                Propofol per Anesthesia Complications:            No immediate complications. Estimated Blood Loss:     Estimated blood loss was minimal. Procedure:                Pre-Anesthesia Assessment:                           - Prior to the procedure, a History and Physical                            was performed, and patient medications and                            allergies were reviewed. The patient's tolerance of                            previous anesthesia was also reviewed. The risks                            and benefits of the procedure and the sedation                            options and risks were discussed with the patient.                            All questions were answered, and informed consent  was obtained. Prior Anticoagulants: The patient has                            taken no previous anticoagulant or antiplatelet                            agents. ASA Grade Assessment: II - A patient with                            mild systemic disease. After reviewing the risks                            and benefits, the patient was deemed in                             satisfactory condition to undergo the procedure.                           After obtaining informed consent, the colonoscope                            was passed under direct vision. Throughout the                            procedure, the patient's blood pressure, pulse, and                            oxygen saturations were monitored continuously. The                            [id number]-HQ190L ([id number]) scope was introduced through                            the anus and advanced to the the cecum, identified                            by appendiceal orifice and ileocecal valve. The                            colonoscopy was performed without difficulty. The                            patient tolerated the procedure well. The quality                            of the bowel preparation was excellent. The                            ileocecal valve, appendiceal orifice, and rectum                            were photographed. Scope In: 7:39:58 AM Scope Out: 8:05:16 AM Scope Withdrawal Time: 0 hours 17 minutes 3 seconds  Total Procedure Duration: 0 hours  25 minutes 18 seconds  Findings:      The perianal and digital rectal examinations were normal.      A 5 to 8 mm polyp was found in the hepatic flexure. The polyp was flat.       The polyp was removed with a cold snare. Resection and retrieval were       complete. To close a defect after polypectomy, one hemostatic clip was       successfully placed (MR conditional). There was no bleeding at the end       of the procedure.      A 4 mm polyp was found in the mid sigmoid colon. The polyp was removed       with a cold snare. Resection and retrieval were complete. The pathology       specimen was placed into Bottle Number 2.      The retroflexed view of the distal rectum and anal verge was normal and       showed no anal or rectal abnormalities.      External hemorrhoids were found during retroflexion. The hemorrhoids       were  small. Impression:               - One 5 to 8 mm polyp at the hepatic flexure,                            removed with a cold snare. Resected and retrieved.                            Clip (MR conditional) was placed.                           - One 4 mm polyp in the mid sigmoid colon, removed                            with a cold snare. Resected and retrieved.                           - External hemorrhoids. Moderate Sedation:      Per Anesthesia Care Recommendation:           - Patient has a contact number available for                            emergencies. The signs and symptoms of potential                            delayed complications were discussed with the                            patient. Return to normal activities tomorrow.                            Written discharge instructions were provided to the                            patient.                           -  Resume previous diet today.                           - Continue present medications.                           - No aspirin, ibuprofen, naproxen, or other                            non-steroidal anti-inflammatory drugs for 3 days.                           - Await pathology results.                           - Repeat colonoscopy in 5 years. Procedure Code(s):        --- Professional ---                           [phone], Colonoscopy, flexible; with removal of                            tumor(s), polyp(s), or other lesion(s) by snare                            technique Diagnosis Code(s):        --- Professional ---                           K63.5, Polyp of colon                           Z80.0, Family history of malignant neoplasm of                            digestive organs CPT copyright 2019 [employer]. All rights reserved. The codes documented in this report are preliminary and upon coder review may  be revised to meet current compliance requirements. Hildred Laser, MD Hildred Laser, MD 04/03/2022 8:14:21 AM This report has been signed electronically. Number of Addenda: 0

## 2022-04-03 NOTE — Discharge Instructions (Addendum)
No aspirin or NSAIDs for 72 hours Resume usual medications and diet as before. No driving for 24 hours. Physician will call with biopsy results. Remember you cannot have an MRI until clip has passed.

## 2022-04-03 NOTE — Anesthesia Preprocedure Evaluation (Signed)
Anesthesia Evaluation  Patient identified by MRN, date of birth, ID band Patient awake    Reviewed: Allergy & Precautions, H&P , NPO status , Patient's Chart, lab work & pertinent test results, reviewed documented beta blocker date and time   Airway Mallampati: II  TM Distance: >3 FB Neck ROM: full    Dental no notable dental hx.    Pulmonary neg pulmonary ROS,    Pulmonary exam normal breath sounds clear to auscultation       Cardiovascular Exercise Tolerance: Good hypertension, negative cardio ROS   Rhythm:regular Rate:Normal     Neuro/Psych negative neurological ROS  negative psych ROS   GI/Hepatic negative GI ROS, Neg liver ROS,   Endo/Other  negative endocrine ROS  Renal/GU negative Renal ROS  negative genitourinary   Musculoskeletal   Abdominal   Peds  Hematology negative hematology ROS (+)   Anesthesia Other Findings   Reproductive/Obstetrics negative OB ROS                             Anesthesia Physical Anesthesia Plan  ASA: 2  Anesthesia Plan: General   Post-op Pain Management:    Induction:   PONV Risk Score and Plan: Propofol infusion  Airway Management Planned:   Additional Equipment:   Intra-op Plan:   Post-operative Plan:   Informed Consent: I have reviewed the patients History and Physical, chart, labs and discussed the procedure including the risks, benefits and alternatives for the proposed anesthesia with the patient or authorized representative who has indicated his/her understanding and acceptance.     Dental Advisory Given  Plan Discussed with: CRNA  Anesthesia Plan Comments:         Anesthesia Quick Evaluation  

## 2022-04-04 ENCOUNTER — Encounter (INDEPENDENT_AMBULATORY_CARE_PROVIDER_SITE_OTHER): Payer: Self-pay | Admitting: *Deleted

## 2022-04-04 LAB — SURGICAL PATHOLOGY

## 2022-04-04 NOTE — Anesthesia Postprocedure Evaluation (Signed)
Anesthesia Post Note  Patient: Jonathan Hudson  Procedure(s) Performed: COLONOSCOPY WITH PROPOFOL POLYPECTOMY HEMOSTASIS CLIP PLACEMENT  Patient location during evaluation: Phase II Anesthesia Type: General Level of consciousness: awake Pain management: pain level controlled Vital Signs Assessment: post-procedure vital signs reviewed and stable Respiratory status: spontaneous breathing and respiratory function stable Cardiovascular status: blood pressure returned to baseline and stable Postop Assessment: no headache and no apparent nausea or vomiting Anesthetic complications: no Comments: Late entry   No notable events documented.   Last Vitals:  Vitals:   04/03/22 0809 04/03/22 0814  BP: (!) 96/45 104/71  Pulse:    Resp: 11   Temp: 36.6 C   SpO2: 96%     Last Pain:  Vitals:   04/03/22 0814  TempSrc:   PainSc: 0-No pain                 Louann Sjogren

## 2022-04-14 ENCOUNTER — Encounter (HOSPITAL_COMMUNITY): Payer: Self-pay | Admitting: Internal Medicine

## 2022-05-17 IMAGING — MG DIGITAL DIAGNOSTIC BILAT W/ TOMO W/ CAD
6 of 10 series · 6 of 30 positions shown · non-contrast
Comparison: None.

ACR Breast Density Category a: The breast tissue is almost entirely
fatty.

CLINICAL DATA: 54-year-old male with a palpable area of concern in
the breast.

EXAM:
DIGITAL DIAGNOSTIC BILATERAL MAMMOGRAM WITH TOMOSYNTHESIS AND CAD
TECHNIQUE: Bilateral digital diagnostic mammography and breast tomosynthesis
was performed. The images were evaluated with computer-aided
detection.

[R MLO synth-2D]
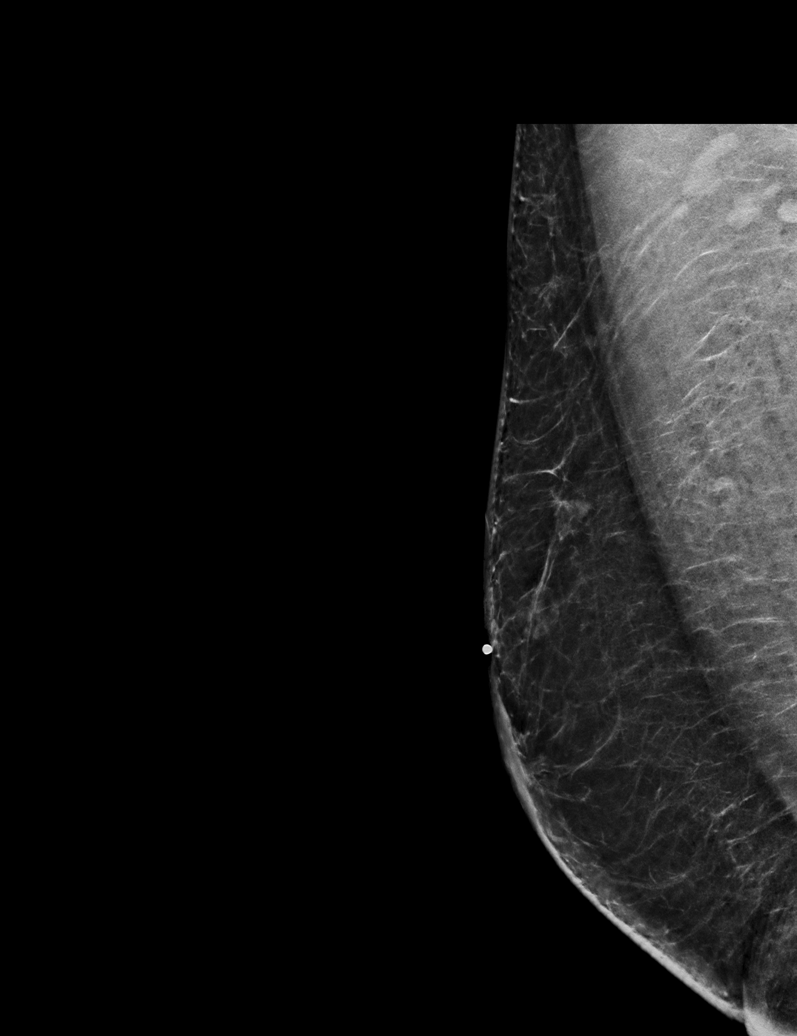

[R CC synth-2D (1 of 2)]
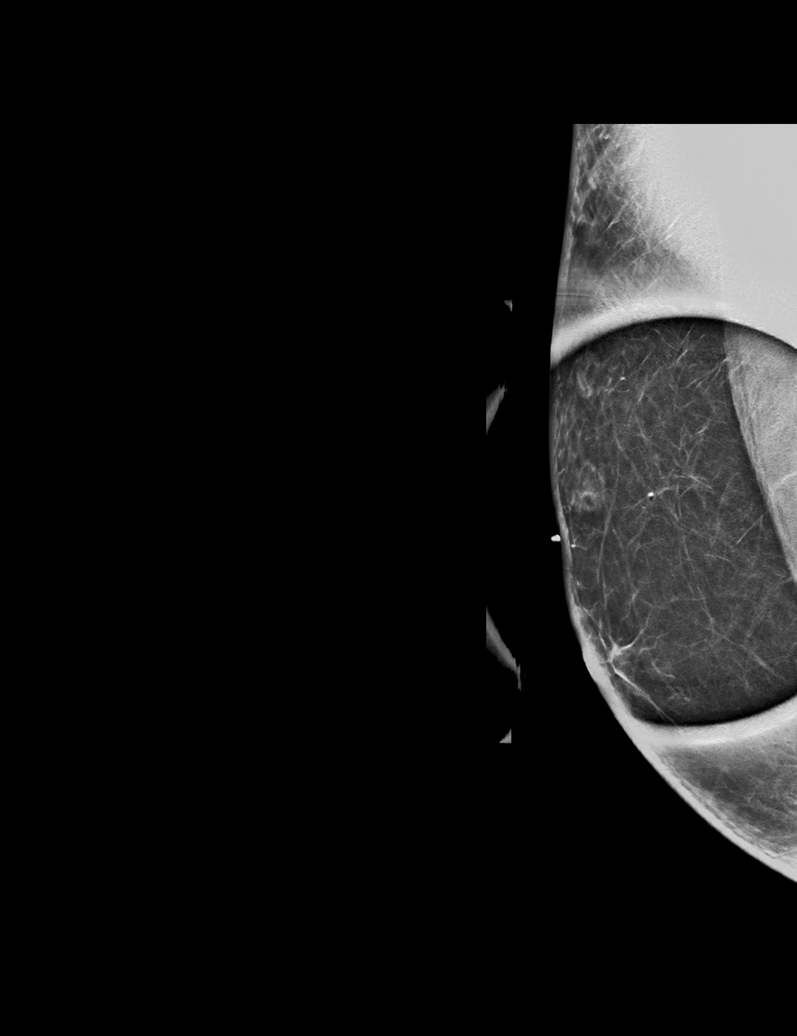

[R CC synth-2D (2 of 2)]
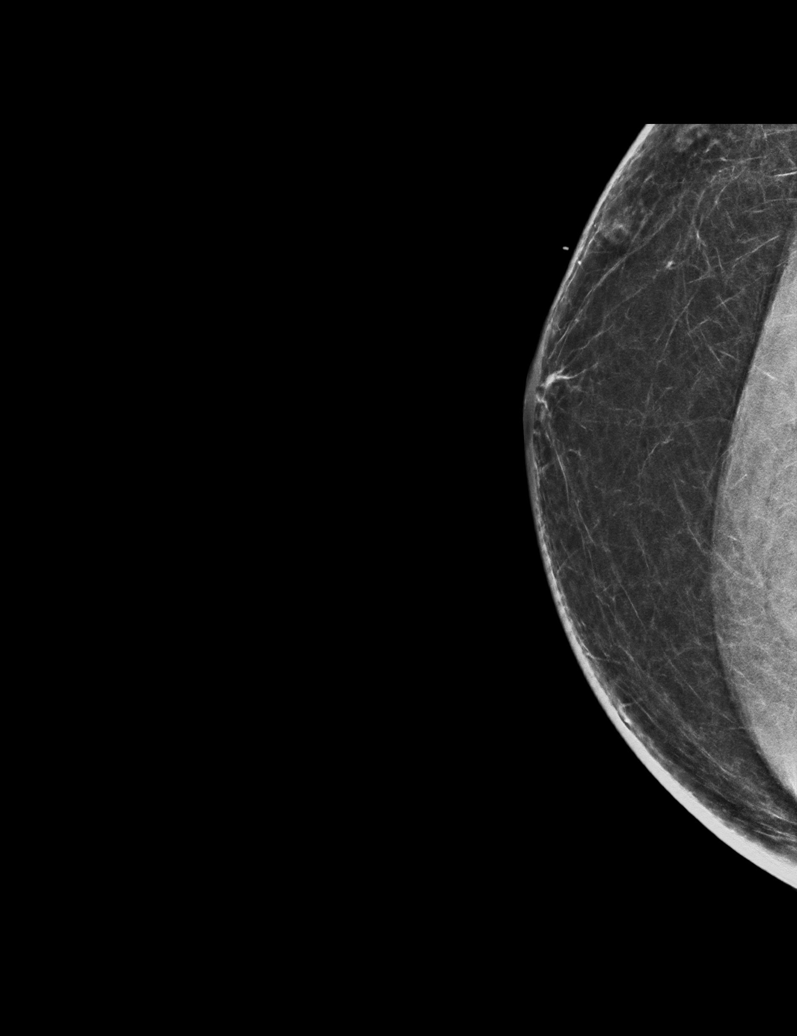

[L CC synth-2D]
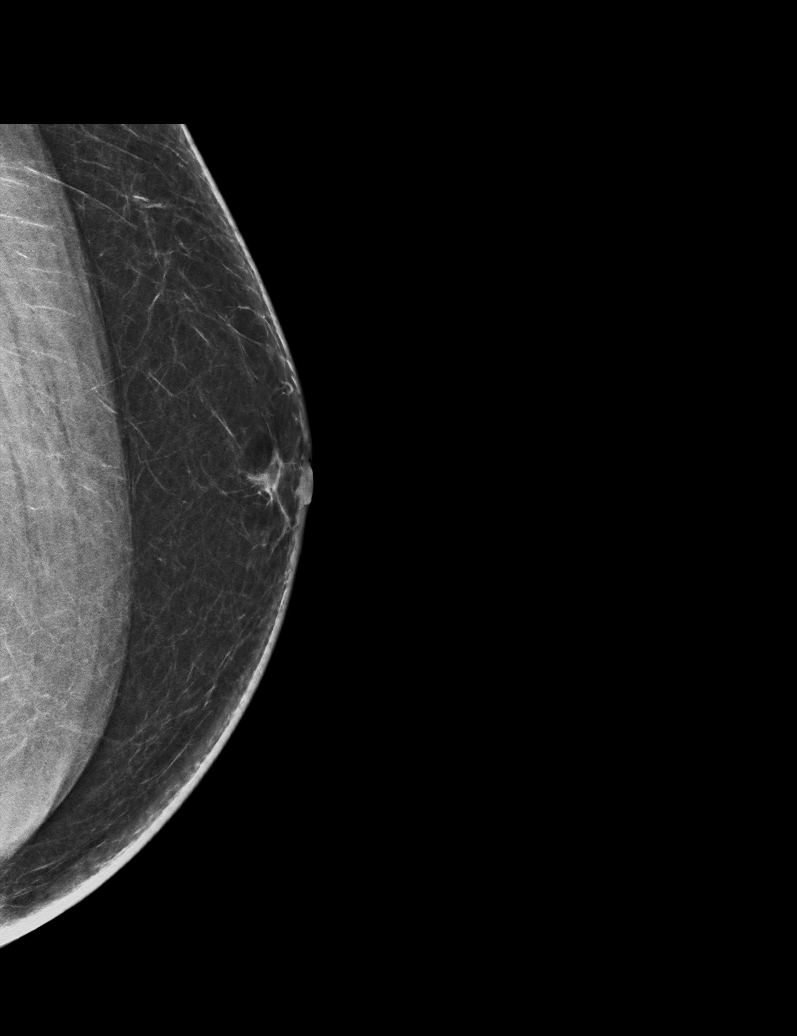

[L MLO synth-2D]
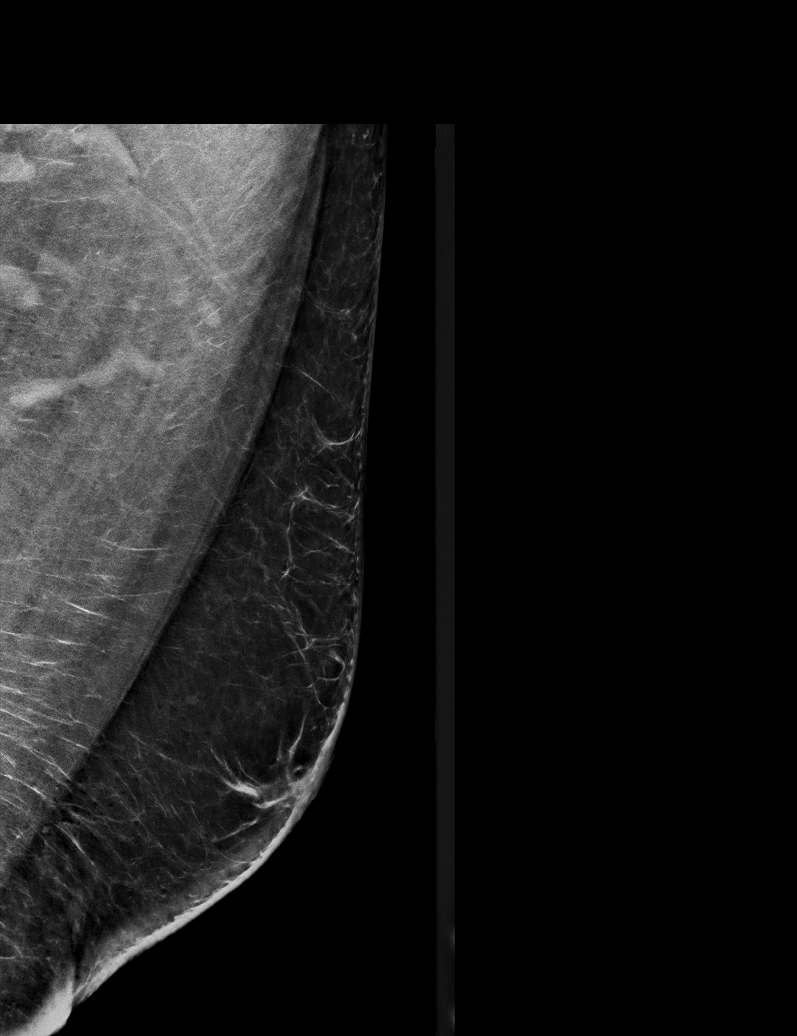

[L MLO tomo · tomo slice 41/82.0]
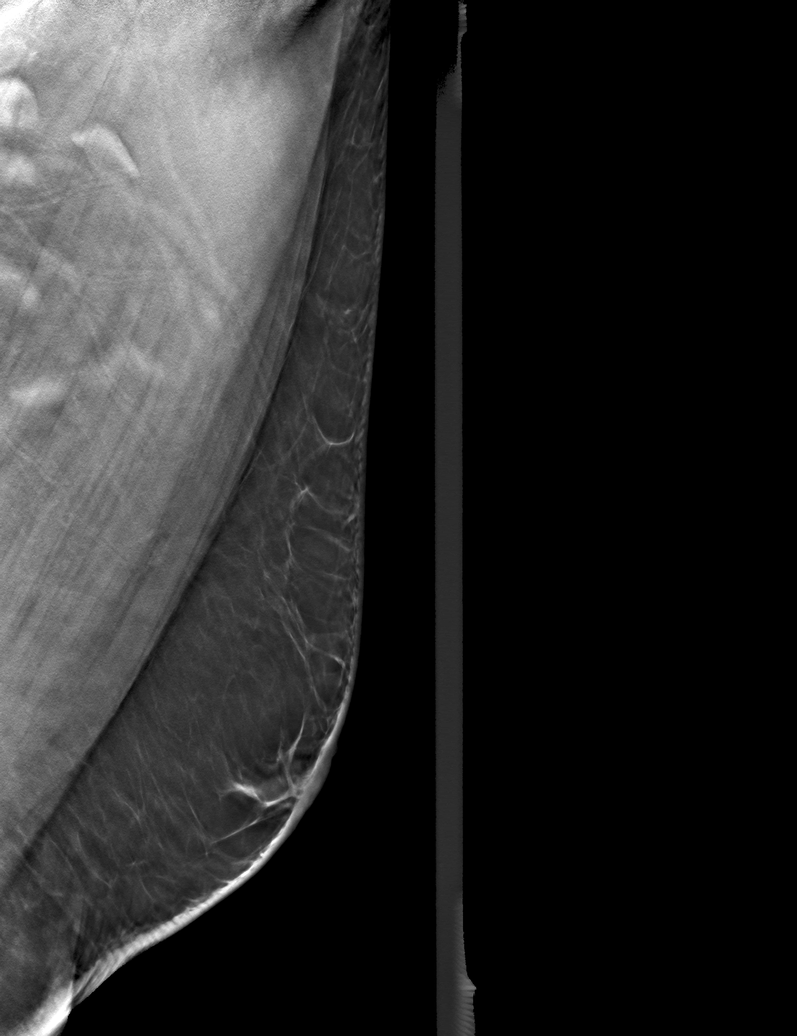

[6 of 30 positions shown; findings below may reference images not displayed]

FINDINGS: There is an oval mixed density fat containing mass corresponding to
the palpable marker in the upper-outer right breast superficial
depth measuring 1.1 cm. No suspicious masses or calcifications are
seen in the left breast.

Physical examination at site of palpable concern in the upper outer
right breast reveals a firm slightly mobile mass at the approximate
10 o'clock position.

Targeted ultrasound of the right breast was performed. There is an
oval hyperechoic mass in the right breast at 10 o'clock 2 cm from
nipple measuring 1.3 x 1 x 1.2 cm. Findings are most suggestive of
an area of fat necrosis.
IMPRESSION: Probable area of fat necrosis at site of palpable concern in the
right breast.

RECOMMENDATION:
Recommend follow-up ultrasound for the probable area of fat necrosis
in the right breast in 3 months to demonstrate stability/resolution.

I have discussed the findings and recommendations with the patient.
If applicable, a reminder letter will be sent to the patient
regarding the next appointment.

BI-RADS CATEGORY  3: Probably benign.

## 2022-09-04 ENCOUNTER — Ambulatory Visit
Admission: EM | Admit: 2022-09-04 | Discharge: 2022-09-04 | Disposition: A | Discharge status: Home / Self Care | Payer: 59 | Attending: Family Medicine | Admitting: Family Medicine

## 2022-09-04 ENCOUNTER — Encounter: Payer: Self-pay | Admitting: Emergency Medicine

## 2022-09-04 DIAGNOSIS — R221 Localized swelling, mass and lump, neck: Secondary | ICD-10-CM

## 2022-09-04 MED ORDER — DOXYCYCLINE HYCLATE 100 MG PO CAPS: 100.0000 mg | ORAL_CAPSULE | Freq: Two times a day (BID) | ORAL | 0 refills | Status: DC

## 2022-09-04 NOTE — ED Provider Notes (Signed)
[facility]    CSN: [id number] Arrival date & time: 09/04/22  1649      History   Chief Complaint No chief complaint on file.   HPI Jonathan Hudson is a 56 y.o. male.   Patient presenting today with a large painful swollen lump to the left side of his neck that started yesterday evening.  Denies any injury to the area, states he was out blowing leaves with a leaf blower when it started spontaneously.  Denies fever, chills, difficulty breathing or swallowing, sore throat, ear pain, dental issues.  So for not trying anything over-the-counter for symptoms.    Past Medical History:  Diagnosis Date   Boils 01/01/2017   Hypercholesteremia    Hypertension     Patient Active Problem List   Diagnosis Date Noted   Special screening for malignant neoplasms, colon 10/23/2016   Family hx of colon cancer 10/23/2016   Range of motion deficit 03/28/2014   Dehiscence of operative wound 01/24/2014   Fracture of forearm, closed 01/24/2014   Closed fracture of shaft of right radius and ulna 12/28/2013   Forearm fracture 12/28/2013   Fx radius/ulna shaft-closed 12/28/2013    Past Surgical History:  Procedure Laterality Date   COLONOSCOPY N/A 01/29/2017   Procedure: COLONOSCOPY;  Surgeon: Rogene Houston, MD;  [facility];  Service: Endoscopy;  Laterality: N/A;  730   COLONOSCOPY WITH PROPOFOL N/A 04/03/2022   Procedure: COLONOSCOPY WITH PROPOFOL;  Surgeon: Rogene Houston, MD;  [facility];  Service: Endoscopy;  Laterality: N/A;  730   cyst removed from left wrist      EYE SURGERY     grafts N/A 10  years  ago   grafts to gums   HEMOSTASIS CLIP PLACEMENT  04/03/2022   Procedure: HEMOSTASIS CLIP PLACEMENT;  Surgeon: Rogene Houston, MD;  [facility];  Service: Endoscopy;;   OPEN REDUCTION INTERNAL FIXATION (ORIF) DISTAL RADIAL FRACTURE Right 12/28/2013   Procedure: OPEN REDUCTION INTERNAL FIXATION (ORIF) DISTAL RADIAL FRACTURE;   Surgeon: Carole Civil, MD;  Location: [facility];  Service: Orthopedics;  Laterality: Right;   ORIF ULNAR FRACTURE Right 12/28/2013   Procedure: OPEN REDUCTION INTERNAL FIXATION (ORIF) ULNAR FRACTURE;  Surgeon: Carole Civil, MD;  Location: [facility];  Service: Orthopedics;  Laterality: Right;   POLYPECTOMY  04/03/2022   Procedure: POLYPECTOMY;  Surgeon: Rogene Houston, MD;  [facility];  Service: Endoscopy;;   right knee Right teenager   right knee        Home Medications    Prior to Admission medications   Medication Sig Start Date End Date Taking? Authorizing Provider  doxycycline (VIBRAMYCIN) 100 MG capsule Take 1 capsule (100 mg total) by mouth 2 (two) times daily. 09/04/22  Yes Volney American, PA-C  amLODipine (NORVASC) 10 MG tablet Take 10 mg by mouth daily. 02/19/22   [provider]  atorvastatin (LIPITOR) 40 MG tablet Take 40 mg by mouth daily.    [provider]  lansoprazole (PREVACID) 15 MG capsule Take 15 mg by mouth every morning.    [provider]  Multiple Vitamin (MULTIVITAMIN WITH MINERALS) TABS tablet Take 1 tablet by mouth daily.    [provider]  olmesartan (BENICAR) 40 MG tablet Take 40 mg by mouth daily. 02/19/22   [provider]  sildenafil (VIAGRA) 100 MG tablet Take 100 mg by mouth daily as needed for erectile dysfunction. 01/13/17   [provider]    Family History History reviewed. No pertinent family history.  Social History Social History   Tobacco Use   Smoking status: Never   Smokeless tobacco: Never  Vaping Use   Vaping Use: Never used  Substance Use Topics   Alcohol use: Yes    Comment: Occ  beer or wine    Drug use: No     Allergies   Patient has no known allergies.   Review of Systems Review of Systems Per HPI  Physical Exam Triage Vital Signs ED Triage Vitals  Enc Vitals Group     BP 09/04/22 1706 (!) 149/87     Pulse Rate 09/04/22 1706 79      Resp 09/04/22 1706 18     Temp 09/04/22 1706 98.7 F (37.1 C)     Temp Source 09/04/22 1706 Oral     SpO2 09/04/22 1706 98 %     Weight --      Height --      Head Circumference --      Peak Flow --      Pain Score 09/04/22 1707 1     Pain Loc --      Pain Edu? --      Excl. in [place]? --    No data found.  Updated Vital Signs BP (!) 149/87 (BP Location: Right Arm)   Pulse 79   Temp 98.7 F (37.1 C) (Oral)   Resp 18   SpO2 98%   Visual Acuity Right Eye Distance:   Left Eye Distance:   Bilateral Distance:    Right Eye Near:   Left Eye Near:    Bilateral Near:     Physical Exam Vitals and nursing note reviewed.  Constitutional:      Appearance: Normal appearance.  HENT:     Head: Atraumatic.     Mouth/Throat:     Mouth: Mucous membranes are moist.     Comments: No abnormalities within the oral cavity, oral airway patent Eyes:     Extraocular Movements: Extraocular movements intact.     Conjunctiva/sclera: Conjunctivae normal.  Cardiovascular:     Rate and Rhythm: Normal rate and regular rhythm.  Pulmonary:     Effort: Pulmonary effort is normal. No respiratory distress.     Breath sounds: Normal breath sounds.  Musculoskeletal:        General: Swelling and tenderness present. Normal range of motion.     Cervical back: Normal range of motion and neck supple.     Comments: 3 to 4 cm in diameter localized firm tender to palpation mass to the left neck near her jawline  Skin:    General: Skin is warm and dry.     Findings: No bruising or erythema.  Neurological:     General: No focal deficit present.     Mental Status: He is oriented to person, place, and time.  Psychiatric:        Mood and Affect: Mood normal.        Thought Content: Thought content normal.        Judgment: Judgment normal.      UC Treatments / Results  Labs (all labs ordered are listed, but only abnormal results are displayed) Labs Reviewed - No data to display  EKG   Radiology No  results found.  Procedures Procedures (including critical care time)  Medications Ordered in UC Medications - No data to display  Initial Impression / Assessment and Plan / UC  Course  I have reviewed the triage vital signs and the nursing notes.  Pertinent labs & imaging results that were available during my care of the patient were reviewed by me and considered in my medical decision making (see chart for details).     Possibly infected salivary gland or lymphadenopathy, treat with antibiotic course, warm compresses, anti-inflammatory pain medications.  Return for any worsening symptoms.  Final Clinical Impressions(s) / UC Diagnoses   Final diagnoses:  Neck swelling     Discharge Instructions      Apply warm compresses to the area off-and-on and take anti-inflammatory pain medication such as ibuprofen or Aleve to help with pain and swelling.  If your symptoms worsen and are not improving follow-up right away    ED Prescriptions     Medication Sig Dispense Auth. Provider   doxycycline (VIBRAMYCIN) 100 MG capsule Take 1 capsule (100 mg total) by mouth 2 (two) times daily. 20 capsule Volney American, [place]      PDMP not reviewed this encounter.   Volney American, [place] 09/04/22 1743

## 2022-09-04 NOTE — Discharge Instructions (Signed)
Apply warm compresses to the area off-and-on and take anti-inflammatory pain medication such as ibuprofen or Aleve to help with pain and swelling.  If your symptoms worsen and are not improving follow-up right away

## 2022-09-04 NOTE — ED Triage Notes (Signed)
Knot on left side of neck after doing yard work yesterday.  States area feels sore.

## 2022-09-23 ENCOUNTER — Other Ambulatory Visit (HOSPITAL_COMMUNITY): Payer: Self-pay | Admitting: Family Medicine

## 2022-09-23 ENCOUNTER — Ambulatory Visit
Admission: RE | Admit: 2022-09-23 | Discharge: 2022-09-23 | Disposition: A | Discharge status: Home / Self Care | Payer: Self-pay | Source: Ambulatory Visit | Attending: Family Medicine | Admitting: Family Medicine

## 2022-09-23 DIAGNOSIS — R590 Localized enlarged lymph nodes: Secondary | ICD-10-CM

## 2022-09-24 ENCOUNTER — Other Ambulatory Visit (HOSPITAL_COMMUNITY): Payer: Self-pay | Admitting: Family Medicine

## 2022-09-24 DIAGNOSIS — R590 Localized enlarged lymph nodes: Secondary | ICD-10-CM

## 2022-10-01 ENCOUNTER — Other Ambulatory Visit (HOSPITAL_COMMUNITY): Payer: Self-pay | Admitting: Family Medicine

## 2022-10-01 ENCOUNTER — Ambulatory Visit (HOSPITAL_COMMUNITY)
Admission: RE | Admit: 2022-10-01 | Discharge: 2022-10-01 | Disposition: A | Discharge status: Home / Self Care | Payer: 59 | Source: Ambulatory Visit | Attending: Family Medicine | Admitting: Family Medicine

## 2022-10-01 DIAGNOSIS — R590 Localized enlarged lymph nodes: Secondary | ICD-10-CM

## 2022-10-06 ENCOUNTER — Ambulatory Visit (HOSPITAL_COMMUNITY)
Admission: RE | Admit: 2022-10-06 | Discharge: 2022-10-06 | Disposition: A | Discharge status: Home / Self Care | Payer: 59 | Source: Ambulatory Visit | Attending: Family Medicine | Admitting: Family Medicine

## 2022-10-06 DIAGNOSIS — R590 Localized enlarged lymph nodes: Secondary | ICD-10-CM | POA: Diagnosis present

## 2022-10-06 MED ORDER — IOHEXOL 350 MG/ML SOLN
75.0000 mL | Freq: Once | INTRAVENOUS | Status: AC | PRN
  Administered 2022-10-06: 75 mL via INTRAVENOUS

## 2022-10-13 ENCOUNTER — Inpatient Hospital Stay: Payer: 59 | Attending: Physician Assistant | Admitting: Physician Assistant

## 2022-10-13 ENCOUNTER — Inpatient Hospital Stay: Payer: 59

## 2022-10-13 ENCOUNTER — Encounter: Payer: Self-pay | Admitting: Physician Assistant

## 2022-10-13 ENCOUNTER — Other Ambulatory Visit: Payer: Self-pay

## 2022-10-13 VITALS — BP 154/85 | HR 62 | Temp 97.7°F | Resp 14 | Wt 246.7 lb

## 2022-10-13 DIAGNOSIS — R590 Localized enlarged lymph nodes: Secondary | ICD-10-CM | POA: Diagnosis present

## 2022-10-13 DIAGNOSIS — R591 Generalized enlarged lymph nodes: Secondary | ICD-10-CM

## 2022-10-13 LAB — SURGICAL PATHOLOGY

## 2022-10-13 LAB — HEPATITIS B SURFACE ANTIGEN: Hepatitis B Surface Ag: NONREACTIVE

## 2022-10-13 LAB — CBC WITH DIFFERENTIAL (CANCER CENTER ONLY)
Abs Immature Granulocytes: 0.02 10*3/uL (ref 0.00–0.07)
Basophils Absolute: 0.1 10*3/uL (ref 0.0–0.1)
Basophils Relative: 1 %
Eosinophils Absolute: 0.2 10*3/uL (ref 0.0–0.5)
Eosinophils Relative: 2 %
HCT: 45.6 % (ref 39.0–52.0)
Hemoglobin: 15.5 g/dL (ref 13.0–17.0)
Immature Granulocytes: 0 %
Lymphocytes Relative: 32 %
Lymphs Abs: 3.1 10*3/uL (ref 0.7–4.0)
MCH: 30.9 pg (ref 26.0–34.0)
MCHC: 34 g/dL (ref 30.0–36.0)
MCV: 91 fL (ref 80.0–100.0)
Monocytes Absolute: 0.7 10*3/uL (ref 0.1–1.0)
Monocytes Relative: 7 %
Neutro Abs: 5.5 10*3/uL (ref 1.7–7.7)
Neutrophils Relative %: 58 %
Platelet Count: 268 10*3/uL (ref 150–400)
RBC: 5.01 MIL/uL (ref 4.22–5.81)
RDW: 13.2 % (ref 11.5–15.5)
WBC Count: 9.5 10*3/uL (ref 4.0–10.5)
nRBC: 0 % (ref 0.0–0.2)

## 2022-10-13 LAB — CMP (CANCER CENTER ONLY)
ALT: 20 U/L (ref 0–44)
AST: 20 U/L (ref 15–41)
Albumin: 4.2 g/dL (ref 3.5–5.0)
Alkaline Phosphatase: 79 U/L (ref 38–126)
Anion gap: 4 — ABNORMAL LOW (ref 5–15)
BUN: 12 mg/dL (ref 6–20)
CO2: 30 mmol/L (ref 22–32)
Calcium: 9.7 mg/dL (ref 8.9–10.3)
Chloride: 107 mmol/L (ref 98–111)
Creatinine: 0.98 mg/dL (ref 0.61–1.24)
GFR, Estimated: >60 mL/min (ref >60)
Glucose, Bld: 107 mg/dL — ABNORMAL HIGH (ref 70–99)
Potassium: 4.4 mmol/L (ref 3.5–5.1)
Sodium: 141 mmol/L (ref 135–145)
Total Bilirubin: 0.5 mg/dL (ref 0.3–1.2)
Total Protein: 6.9 g/dL (ref 6.5–8.1)

## 2022-10-13 LAB — LACTATE DEHYDROGENASE: LDH: 198 U/L — ABNORMAL HIGH (ref 98–192)

## 2022-10-13 LAB — HEPATITIS B CORE ANTIBODY, TOTAL: Hep B Core Total Ab: NONREACTIVE

## 2022-10-13 LAB — SEDIMENTATION RATE: Sed Rate: 7 mm/hr (ref 0–16)

## 2022-10-13 LAB — HEPATITIS C ANTIBODY: HCV Ab: NONREACTIVE

## 2022-10-13 LAB — HIV ANTIBODY (ROUTINE TESTING W REFLEX): HIV Screen 4th Generation wRfx: NONREACTIVE

## 2022-10-13 LAB — HEPATITIS B SURFACE ANTIBODY,QUALITATIVE: Hep B S Ab: NONREACTIVE

## 2022-10-13 LAB — C-REACTIVE PROTEIN: CRP: <0.5 mg/dL (ref <1.0)

## 2022-10-13 NOTE — Progress Notes (Signed)
BIOPSY REVIEW Date: 10/13/22  Requested Biopsy site: Neck  Reason for request: Adenopathy  Imaging review: CT neck,10/06/22 and US neck, 10/01/22 Recommended imaging modality to perform biopsy: [place], LEFT cervical LNBx Additional comments:  '@Schedulers'$ , local only. No sedation. '@Performing'$  Doc, centrally necrotic L level II node. The superior portion appears more solid.  Please contact me with questions, concerns, or if issue pertaining to this request arise.  Michaelle Birks, MD [facility]  Pager. [place]

## 2022-10-13 NOTE — Progress Notes (Signed)
[facility] [phone]   [phone]  INITIAL CONSULTATION:  Patient Care Team: Celene Squibb, MD as PCP - General (Internal Medicine)  CHIEF COMPLAINTS/PURPOSE OF CONSULTATION:  Left neck adenopathy  HISTORY OF PRESENTING ILLNESS:  Jonathan Hudson 56 y.o. male with medical history significant for hypertension and hypercholesteremia presents to the diagnostic clinic for evaluation for left neck lymphadenopathy.  On review of the previous records, Jonathan Hudson presented to the urgent care on 09/04/2022 with a large painful left neck mass. It was initially felt to be infectious so treated with doxycycline with little improvement. He followed by with his PCP on 09/22/2022 who ordered a ultrasound on 10/01/2022 which revealed a hypoechoic solid mass in the left neck measuring 4.7 x 2.7 x 4.2 cm. CT neck followed on 10/06/2022 which showed bulky and necrotic adenopathy in the left level 2 neck.   On exam today, Jonathan Hudson reports that his energy levels are fairly stable. He is able to complete all his ADLs on his own. He is currently on FMLA while he undergoes workup. He reports having a good appetite and denies any weight loss. He denies nausea, vomiting or abdominal pain. His bowel habits are unchanged without any recurrent episodes of diarrhea or constipation. He denies easy bruising or signs of active bleeding. He reports having intermittent episodes of positional dizziness without any syncopal episodes.He denies fevers, chills, night sweats, shortness of breath, chest pain, cough, peripheral edema, neuropathy, pruritus or rash. He has no other complaints. Rest of the 10 point ROS is below.   MEDICAL HISTORY:  Past Medical History:  Diagnosis Date   Boils 01/01/2017   Hypercholesteremia    Hypertension     SURGICAL HISTORY: Past Surgical History:  Procedure Laterality Date   COLONOSCOPY N/A 01/29/2017   Procedure:  COLONOSCOPY;  Surgeon: Rogene Houston, MD;  [facility];  Service: Endoscopy;  Laterality: N/A;  730   COLONOSCOPY WITH PROPOFOL N/A 04/03/2022   Procedure: COLONOSCOPY WITH PROPOFOL;  Surgeon: Rogene Houston, MD;  [facility];  Service: Endoscopy;  Laterality: N/A;  730   cyst removed from left wrist      EYE SURGERY     grafts N/A 10  years  ago   grafts to gums   HEMOSTASIS CLIP PLACEMENT  04/03/2022   Procedure: HEMOSTASIS CLIP PLACEMENT;  Surgeon: Rogene Houston, MD;  [facility];  Service: Endoscopy;;   OPEN REDUCTION INTERNAL FIXATION (ORIF) DISTAL RADIAL FRACTURE Right 12/28/2013   Procedure: OPEN REDUCTION INTERNAL FIXATION (ORIF) DISTAL RADIAL FRACTURE;  Surgeon: Carole Civil, MD;  Location: [facility];  Service: Orthopedics;  Laterality: Right;   ORIF ULNAR FRACTURE Right 12/28/2013   Procedure: OPEN REDUCTION INTERNAL FIXATION (ORIF) ULNAR FRACTURE;  Surgeon: Carole Civil, MD;  Location[facility];  Service: Orthopedics;  Laterality: Right;   POLYPECTOMY  04/03/2022   Procedure: POLYPECTOMY;  Surgeon: Rogene Houston, MD;  [facility];  Service: Endoscopy;;   right knee Right teenager   right knee     SOCIAL HISTORY: Social History   Socioeconomic History   Marital status: Divorced    Spouse name: Not on file   Number of children: Not on file   Years of education: Not on file   Highest education level: Not on file  Occupational History   Not on file  Tobacco Use   Smoking status: Never   Smokeless tobacco: Never  Vaping Use   Vaping Use: Never used  Substance and Sexual Activity   Alcohol use: Yes    Comment: Occ  beer or wine    Drug use: No   Sexual activity: Not on file  Other Topics Concern   Not on file  Social History Narrative   Not on file   Social Determinants of Health   Financial Resource Strain: Not on file  Food Insecurity: Not on file  Transportation Needs: Not on file  Physical  Activity: Not on file  Stress: Not on file  Social Connections: Not on file  Intimate Partner Violence: Not on file    FAMILY HISTORY: No family history on file.  ALLERGIES:  has No Known Allergies.  MEDICATIONS:  Current Outpatient Medications  Medication Sig Dispense Refill   amLODipine (NORVASC) 10 MG tablet Take 10 mg by mouth daily.     atorvastatin (LIPITOR) 40 MG tablet Take 40 mg by mouth daily.     doxycycline (VIBRAMYCIN) 100 MG capsule Take 1 capsule (100 mg total) by mouth 2 (two) times daily. 20 capsule 0   lansoprazole (PREVACID) 15 MG capsule Take 15 mg by mouth every morning.     Multiple Vitamin (MULTIVITAMIN WITH MINERALS) TABS tablet Take 1 tablet by mouth daily.     olmesartan (BENICAR) 40 MG tablet Take 40 mg by mouth daily.     sildenafil (VIAGRA) 100 MG tablet Take 100 mg by mouth daily as needed for erectile dysfunction.  5   No current facility-administered medications for this visit.    REVIEW OF SYSTEMS:   Constitutional: ( - ) fevers, ( - )  chills , ( - ) night sweats Eyes: ( - ) blurriness of vision, ( - ) double vision, ( - ) watery eyes Ears, nose, mouth, throat, and face: ( - ) mucositis, ( - ) sore throat Respiratory: ( - ) cough, ( - ) dyspnea, ( - ) wheezes Cardiovascular: ( - ) palpitation, ( - ) chest discomfort, ( - ) lower extremity swelling Gastrointestinal:  ( - ) nausea, ( - ) heartburn, ( - ) change in bowel habits Skin: ( - ) abnormal skin rashes Lymphatics: ( - ) new lymphadenopathy, ( - ) easy bruising Neurological: ( - ) numbness, ( - ) tingling, ( - ) new weaknesses Behavioral/Psych: ( - ) mood change, ( - ) new changes  All other systems were reviewed with the patient and are negative.  PHYSICAL EXAMINATION: ECOG PERFORMANCE STATUS: 1 - Symptomatic but completely ambulatory  There were no vitals filed for this visit. There were no vitals filed for this visit.  GENERAL: well appearing male in NAD  SKIN: skin color, texture,  turgor are normal, no rashes or significant lesions EYES: conjunctiva are pink and non-injected, sclera clear OROPHARYNX: no exudate, no erythema; lips, buccal mucosa, and tongue normal  NECK: supple, non-tender LYMPH:  no palpable lymphadenopathy in the axillary or supraclavicular lymph nodes.  Palpable left neck mass that is measuring approximately 4-5cm, firm, nontender LUNGS: clear to auscultation and percussion with normal breathing effort HEART: regular rate & rhythm and no murmurs and no lower extremity edema ABDOMEN: soft, non-tender, non-distended, normal bowel sounds Musculoskeletal: no cyanosis of digits and no clubbing  PSYCH: alert & oriented x 3, fluent speech NEURO: no focal motor/sensory deficits  LABORATORY DATA:  I have reviewed the data as listed    Latest Ref Rng & Units 10/13/2022   10:06 AM 12/28/2013  1:52 AM 10/20/2008   10:19 AM  CBC  WBC 4.0 - 10.5 K/uL 9.5  15.0  8.8   Hemoglobin 13.0 - 17.0 g/dL 15.5  15.0  14.9   Hematocrit 39.0 - 52.0 % 45.6  43.2  45.5   Platelets 150 - 400 K/uL 268  238  243        Latest Ref Rng & Units 12/28/2013    1:52 AM 10/20/2008   10:19 AM  CMP  Glucose 70 - 99 mg/dL 146  104   BUN 6 - 23 mg/dL 17  11   Creatinine 0.50 - 1.35 mg/dL 1.14  0.89   Sodium 137 - 147 mEq/L 141  138   Potassium 3.7 - 5.3 mEq/L 3.5  4.2   Chloride 96 - 112 mEq/L 103  107   CO2 19 - 32 mEq/L 24  26   Calcium 8.4 - 10.5 mg/dL 8.9  9.2      RADIOGRAPHIC STUDIES: I have personally reviewed the radiological images as listed and agreed with the findings in the report. CT SOFT TISSUE NECK W CONTRAST  Result Date: 10/07/2022 CLINICAL DATA:  Left-sided neck mass in EXAM: CT NECK WITH CONTRAST TECHNIQUE: Multidetector CT imaging of the neck was performed using the standard protocol following the bolus administration of intravenous contrast. RADIATION DOSE REDUCTION: This exam was performed according to the departmental dose-optimization program  which includes automated exposure control, adjustment of the mA and/or kV according to patient size and/or use of iterative reconstruction technique. CONTRAST:  13m OMNIPAQUE IOHEXOL 350 MG/ML SOLN COMPARISON:  Neck ultrasound 10/01/2022 FINDINGS: Pharynx and larynx: No primary mass is seen. Salivary glands: No inflammation, mass, or stone. Thyroid: Normal. Lymph nodes: Bulky and necrotic left jugulodigastric node measuring 5.1 cm in length. Given the size and lobulation extracapsular tumor could be present. No contralateral adenopathy noted. Vascular: Negative. Limited intracranial: Negative. Visualized orbits: Negative Mastoids and visualized paranasal sinuses: Clear Skeleton: Ordinary cervical spine degeneration. No acute or aggressive finding Upper chest: Clear apical lungs. These results were called by telephone at the time of interpretation on 10/07/2022 at 9:39 am to provider DEREK HUFFMAN , who verbally acknowledged these results. IMPRESSION: Bulky and necrotic adenopathy in the left level 2 neck, usually metastatic squamous cell carcinoma. No clear primary, but limited at the mass due to dental artifact. Electronically Signed   By: JJorje GuildM.D.   On: 10/07/2022 09:39   [place]FT TISSUE HEAD & NECK (NON-THYROID)  Result Date: 10/01/2022 CLINICAL DATA:  Palpable left neck mass EXAM: ULTRASOUND OF HEAD/NECK SOFT TISSUES TECHNIQUE: Ultrasound examination of the head and neck soft tissues was performed in the area of clinical concern. COMPARISON:  None available FINDINGS: Targeted sonographic evaluation of the area of concern in the left neck demonstrates hypoechoic solid mass measuring 4.7 x 2.7 x 4.2 cm. IMPRESSION: Sonographic evaluation of the area of concern in the left neck demonstrates a hypoechoic solid mass measuring 4.7 x 2.7 x 4.2 cm. This is most likely a pathologically enlarged lymph node. Further evaluation with contrast enhanced soft tissue neck CT is recommended. Electronically Signed    By: FMiachel RouxM.D.   On: 10/01/2022 15:37    ASSESSMENT & PLAN  DARMARI FUSSELLis a 56y.o. male who presents to the diagnostic clinic for evaluation of left neck lymphadenopathy. We reviewed the CT images in detail from 10/06/2022 that is concerning for pathologically enlarged left jugulodigastric node measuring 5.1 cm in length. We discusseed the  recommended diagnostic workup including laboratory evaluation today followed by biopsy for tissue diagnosis. We will get an urgent core needle biopsy of the target lymph node first and pursue a excisional biopsy if needed. Patient expressed understanding and would like to proceed with the workup.   #Left neck lymphadenopathy: --Labs today to check CBC, CMP, LDH, flow cytometry, ESR, CRP, Hepatitis B/C serologies, HIV serology.  --Need STAT US guided core needle biopsy of target lymph node. Consider referral to ENT for excisional biopsy if core needle biopsy is nondiagnostic.  --Patient denies any B-symptoms including fevers, weight loss, night sweats or weight loss.  --Based on biopsy results, we will complete staging imaging studies. If lymphoma, we will pursue PET/CT scan.  --RTC once workup is complete to finalize diagnosis and discuss treatment recommendations.   No orders of the defined types were placed in this encounter.   All questions were answered. The patient knows to call the clinic with any problems, questions or concerns.  I have spent a total of 60 minutes minutes of face-to-face and non-face-to-face time, preparing to see the patient, obtaining and/or reviewing separately obtained history, performing a medically appropriate examination, counseling and educating the patient, ordering tests/procedures, referring and communicating with other health care professionals, documenting clinical information in the electronic health record,  and care coordination.   Dede Query, PA-C Department of Hematology/Oncology [place] [facility] Phone: [phone]  Patient was seen with Dr. Tadashi Burkel Limbo.    ADDENDUM  .Patient was Personally and independently interviewed, examined and relevant elements of the history of present illness were reviewed in details and an assessment and plan was created. All elements of the patient's history of present illness , assessment and plan were discussed in details with Dede Query, PA-C. The above documentation reflects our combined findings assessment and plan.   Sullivan Lone MD MS

## 2022-10-15 LAB — FLOW CYTOMETRY

## 2022-10-20 ENCOUNTER — Other Ambulatory Visit: Payer: Self-pay | Admitting: Student

## 2022-10-20 DIAGNOSIS — R591 Generalized enlarged lymph nodes: Secondary | ICD-10-CM

## 2022-10-20 NOTE — Progress Notes (Signed)
Patient for Jonathan Hudson guided Core LT Cervical Lymph Node Biopsy on Tues 10/21/2022, I called and spoke with the patient on the phone and gave pre-procedure instructions. Pt was made aware to be here at 12:30p and check in at the [place].  Pt stated understanding. Called 10/14/2022

## 2022-10-21 ENCOUNTER — Ambulatory Visit
Admission: RE | Admit: 2022-10-21 | Discharge: 2022-10-21 | Disposition: A | Discharge status: Home / Self Care | Payer: 59 | Source: Ambulatory Visit | Attending: Physician Assistant | Admitting: Physician Assistant

## 2022-10-21 ENCOUNTER — Other Ambulatory Visit: Payer: Self-pay | Admitting: Physician Assistant

## 2022-10-21 DIAGNOSIS — R591 Generalized enlarged lymph nodes: Secondary | ICD-10-CM | POA: Diagnosis present

## 2022-10-21 DIAGNOSIS — C770 Secondary and unspecified malignant neoplasm of lymph nodes of head, face and neck: Secondary | ICD-10-CM | POA: Insufficient documentation

## 2022-10-21 MED ORDER — LIDOCAINE HCL (PF) 1 % IJ SOLN
10.0000 mL | Freq: Once | INTRAMUSCULAR | Status: AC
  Administered 2022-10-21: 10 mL via INTRADERMAL

## 2022-10-21 NOTE — Discharge Instructions (Addendum)
Post discharge instructions given to patient and explained by Chrissy Arboleda/ KDD

## 2022-10-21 NOTE — Procedures (Signed)
Interventional Radiology Procedure Note  Procedure: Image guided core biopsy and aspiration of cystic left cervical lymph node.   Findings: US shows cystic changes. Biopsy reveals small solid material, as the node is necrotic Aspiration yields ~8cc of dense purulent material  Complications: None  EBL: None Sample: Culture and path sent  Recommendations: - Routine post care  - follow up Cx, path, cytology - routine wound care  Signed,  Dulcy Fanny. Earleen Newport, DO

## 2022-10-23 LAB — SURGICAL PATHOLOGY

## 2022-10-27 LAB — AEROBIC/ANAEROBIC CULTURE W GRAM STAIN (SURGICAL/DEEP WOUND): Gram Stain: NONE SEEN

## 2022-10-28 ENCOUNTER — Telehealth: Payer: Self-pay

## 2022-10-28 ENCOUNTER — Telehealth: Payer: Self-pay | Admitting: Physician Assistant

## 2022-10-28 ENCOUNTER — Other Ambulatory Visit: Payer: Self-pay

## 2022-10-28 DIAGNOSIS — C4492 Squamous cell carcinoma of skin, unspecified: Secondary | ICD-10-CM

## 2022-10-28 NOTE — Telephone Encounter (Signed)
I called Mr. Jonathan Hudson and his sister, Jonathan Hudson, to review the biopsy results from 10/21/2022. 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 I discussed next steps including obtaining a staging PET/CT scan which is scheduled for 11/06/2021 and follow up visit with Dr. Morris Markham Limbo to discuss treatment recommendations which is scheduled for 10/30/2022. We will make a referral to radiation oncology if PET/CT scan confirms localized disease. Patient and his sister expressed understanding of the plan provided.

## 2022-10-28 NOTE — Telephone Encounter (Signed)
PET moved up to 11/06/22 [facility] @ 10:45 AM. Jonathan Hudson with patient's sister to advise of change, She understands that patient should arrive by 10:15  and water only for 6 hours prior to scan.

## 2022-10-28 NOTE — Telephone Encounter (Signed)
Spoke with patient and patient's sister to advise that earliest PET scan appointment is 11/14/2021 at 9:30 AM - [place] arrival at 9 am - water only 6 hours prior. Patient's sister provided with number to radiology central scheduling to check for cancellations.

## 2022-10-30 ENCOUNTER — Inpatient Hospital Stay (HOSPITAL_BASED_OUTPATIENT_CLINIC_OR_DEPARTMENT_OTHER): Payer: 59 | Admitting: Hematology

## 2022-10-30 ENCOUNTER — Inpatient Hospital Stay: Payer: 59

## 2022-10-30 VITALS — BP 150/80 | HR 85 | Temp 97.7°F | Resp 17 | Wt 242.5 lb

## 2022-10-30 DIAGNOSIS — C760 Malignant neoplasm of head, face and neck: Secondary | ICD-10-CM

## 2022-10-30 DIAGNOSIS — R590 Localized enlarged lymph nodes: Secondary | ICD-10-CM | POA: Diagnosis not present

## 2022-10-30 DIAGNOSIS — C4492 Squamous cell carcinoma of skin, unspecified: Secondary | ICD-10-CM

## 2022-10-30 LAB — CBC (CANCER CENTER ONLY)
HCT: 45.1 % (ref 39.0–52.0)
Hemoglobin: 15.5 g/dL (ref 13.0–17.0)
MCH: 31.1 pg (ref 26.0–34.0)
MCHC: 34.4 g/dL (ref 30.0–36.0)
MCV: 90.4 fL (ref 80.0–100.0)
Platelet Count: 276 10*3/uL (ref 150–400)
RBC: 4.99 MIL/uL (ref 4.22–5.81)
RDW: 13.2 % (ref 11.5–15.5)
WBC Count: 11.4 10*3/uL — ABNORMAL HIGH (ref 4.0–10.5)
nRBC: 0 % (ref 0.0–0.2)

## 2022-10-30 LAB — CMP (CANCER CENTER ONLY)
ALT: 49 U/L — ABNORMAL HIGH (ref 0–44)
AST: 35 U/L (ref 15–41)
Albumin: 4.1 g/dL (ref 3.5–5.0)
Alkaline Phosphatase: 74 U/L (ref 38–126)
Anion gap: 4 — ABNORMAL LOW (ref 5–15)
BUN: 15 mg/dL (ref 6–20)
CO2: 31 mmol/L (ref 22–32)
Calcium: 9.4 mg/dL (ref 8.9–10.3)
Chloride: 105 mmol/L (ref 98–111)
Creatinine: 0.94 mg/dL (ref 0.61–1.24)
GFR, Estimated: >60 mL/min (ref >60)
Glucose, Bld: 117 mg/dL — ABNORMAL HIGH (ref 70–99)
Potassium: 4.3 mmol/L (ref 3.5–5.1)
Sodium: 140 mmol/L (ref 135–145)
Total Bilirubin: 0.4 mg/dL (ref 0.3–1.2)
Total Protein: 7.5 g/dL (ref 6.5–8.1)

## 2022-11-06 ENCOUNTER — Encounter (HOSPITAL_COMMUNITY)
Admission: RE | Admit: 2022-11-06 | Discharge: 2022-11-06 | Disposition: A | Discharge status: Home / Self Care | Payer: 59 | Source: Ambulatory Visit | Attending: Physician Assistant | Admitting: Physician Assistant

## 2022-11-06 DIAGNOSIS — C4492 Squamous cell carcinoma of skin, unspecified: Secondary | ICD-10-CM

## 2022-11-06 NOTE — Progress Notes (Addendum)
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 male with medical history significant for hypertension and hypercholesteremia presents to the diagnostic clinic for evaluation for left neck lymphadenopathy.  On review of the previous records, Jonathan Hudson presented to the urgent care on 09/04/2022 with a large painful left neck mass. It was initially felt to be infectious so treated with doxycycline with little improvement. He followed by with his PCP on 09/22/2022 who ordered a ultrasound on 10/01/2022 which revealed a hypoechoic solid mass in the left neck measuring 4.7 x 2.7 x 4.2 cm. CT neck followed on 10/06/2022 which showed bulky and necrotic adenopathy in the left level 2 neck.   On exam today, Jonathan Hudson reports that his energy levels are fairly stable. He is able to complete all his ADLs on his own. He is currently on FMLA while he undergoes workup. He reports having a good appetite and denies any weight loss. He denies nausea, vomiting or abdominal pain. His bowel habits are unchanged without any recurrent episodes of diarrhea or constipation. He denies easy bruising or signs of active bleeding. He reports having intermittent episodes of positional dizziness without any syncopal episodes.He denies fevers, chills, night sweats, shortness of breath, chest pain, cough, peripheral edema, neuropathy, pruritus or rash. He has no other complaints. Rest of the 10 point ROS is below.   INTERVAL HISTORY  Patient is here for follow-up of his newly diagnosed metastatic head and neck squamous cell carcinoma. He is accompanied by several of his family members  for this appointment. 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 We discussed that we need to get a PET CT scan to stage this and it is a limited stage he will need to see ENT surgeon to determine if he is a surgical candidate. Labs done on 10/13/2022 and his pathology results were discussed in details.   MEDICAL HISTORY:  Past Medical History:  Diagnosis Date   Boils 01/01/2017   Hypercholesteremia    Hypertension     SURGICAL HISTORY: Past Surgical History:  Procedure Laterality Date   COLONOSCOPY N/A 01/29/2017   Procedure: COLONOSCOPY;  Surgeon: Rogene Houston, MD;  [facility];  Service: Endoscopy;  Laterality: N/A;  730   COLONOSCOPY WITH PROPOFOL N/A 04/03/2022   Procedure: COLONOSCOPY WITH PROPOFOL;  Surgeon: Rogene Houston, MD;  Location: [facility];  Service: Endoscopy;  Laterality: N/A;  730   cyst removed from left wrist      EYE SURGERY     grafts N/A 10  years  ago   grafts to gums   HEMOSTASIS CLIP PLACEMENT  04/03/2022   Procedure: HEMOSTASIS CLIP PLACEMENT;  Surgeon: Rogene Houston, MD;  Location: [facility];  Service: Endoscopy;;   OPEN REDUCTION INTERNAL FIXATION (ORIF) DISTAL RADIAL FRACTURE Right 12/28/2013   Procedure: OPEN REDUCTION INTERNAL FIXATION (ORIF) DISTAL RADIAL FRACTURE;  Surgeon: Carole Civil, MD;  Location: [facility];  Service: Orthopedics;  Laterality: Right;   ORIF ULNAR FRACTURE Right 12/28/2013   Procedure: OPEN REDUCTION INTERNAL FIXATION (ORIF) ULNAR FRACTURE;  Surgeon: Carole Civil, MD;  Location: [facility];  Service: Orthopedics;  Laterality: Right;   POLYPECTOMY  04/03/2022   Procedure: POLYPECTOMY;  Surgeon: Rogene Houston, MD;  Location[facility];  Service: Endoscopy;;   right knee Right teenager   right knee     SOCIAL HISTORY: Social History   Socioeconomic History   Marital status: Divorced    Spouse name: Not on file    Number of children: Not on file   Years of education: Not on file   Highest education level: Not on file  Occupational History   Not on file  Tobacco Use   Smoking status: Never   Smokeless tobacco: Never  Vaping Use   Vaping Use: Never used  Substance and Sexual Activity   Alcohol use: Yes    Comment: Occ  beer or wine    Drug use: No   Sexual activity: Not on file  Other Topics Concern   Not on file  Social History Narrative   Not on file   Social Determinants of Health   Financial Resource Strain: Not on file  Food Insecurity: Not on file  Transportation Needs: Not on file  Physical Activity: Not on file  Stress: Not on file  Social Connections: Not on file  Intimate Partner Violence: Not on file    FAMILY HISTORY: No family history on file.  ALLERGIES:  has No Known Allergies.  MEDICATIONS:  Current Outpatient Medications  Medication Sig Dispense Refill   amLODipine (NORVASC) 10 MG tablet Take 10 mg by mouth daily.     atorvastatin (LIPITOR) 40 MG tablet Take 40 mg by mouth daily.     doxycycline (VIBRAMYCIN) 100 MG capsule Take 1 capsule (100 mg total) by mouth 2 (two) times daily. 20 capsule 0   lansoprazole (PREVACID) 15 MG capsule Take 15 mg by mouth every morning.     Multiple Vitamin (MULTIVITAMIN WITH MINERALS) TABS tablet Take 1 tablet by mouth daily.     olmesartan (BENICAR) 40 MG tablet Take 40 mg by mouth daily.     sildenafil (VIAGRA) 100 MG tablet Take 100 mg by mouth daily as needed for erectile dysfunction.  5   No current facility-administered medications for this visit.    REVIEW OF SYSTEMS:   10 Point review of Systems was done is negative except as noted above.  PHYSICAL EXAMINATION: ECOG PERFORMANCE STATUS: 1 - Symptomatic but completely ambulatory  Vitals:   10/30/22 1210  BP: (!) 150/80  Pulse: 85  Resp: 17  Temp: 97.7 F (36.5 C)  SpO2: 100%   Filed Weights   10/30/22 1210  Weight: 242 lb 8 oz (110 kg)   . GENERAL:alert, in no acute distress and comfortable SKIN: no acute rashes, no significant lesions EYES: conjunctiva are pink and non-injected, sclera anicteric OROPHARYNX: MMM, no exudates, no oropharyngeal erythema or ulceration NECK: supple, no JVD, 45 cm left upper cervical lymphadenopathy LUNGS: clear to auscultation b/l with normal respiratory effort HEART: regular rate & rhythm ABDOMEN:  normoactive bowel sounds , non tender, not distended. Extremity: no pedal edema PSYCH: alert & oriented x 3 with fluent speech NEURO: no focal motor/sensory deficits  LABORATORY DATA:  I have reviewed the data as listed  .    Latest Ref Rng & Units 10/30/2022   11:41 AM 10/13/2022   10:06 AM 12/28/2013    1:52 AM  CBC  WBC 4.0 - 10.5 K/uL 11.4  9.5  15.0   Hemoglobin 13.0 - 17.0 g/dL 15.5  15.5  15.0  Hematocrit 39.0 - 52.0 % 45.1  45.6  43.2   Platelets 150 - 400 K/uL 276  268  238        Latest Ref Rng & Units 10/30/2022   11:41 AM 10/13/2022   10:06 AM 12/28/2013    1:52 AM  CMP  Glucose 70 - 99 mg/dL 117  107  146   BUN 6 - 20 mg/dL '15  12  17   '$ Creatinine 0.61 - 1.24 mg/dL 0.94  0.98  1.14   Sodium 135 - 145 mmol/L 140  141  141   Potassium 3.5 - 5.1 mmol/L 4.3  4.4  3.5   Chloride 98 - 111 mmol/L 105  107  103   CO2 22 - 32 mmol/L '31  30  24   '$ Calcium 8.9 - 10.3 mg/dL 9.4  9.7  8.9   Total Protein 6.5 - 8.1 g/dL 7.5  6.9    Total Bilirubin 0.3 - 1.2 mg/dL 0.4  0.5    Alkaline Phos 38 - 126 U/L 74  79    AST 15 - 41 U/L 35  20    ALT 0 - 44 U/L 49  20     SURGICAL PATHOLOGY  CASE: [id number]  PATIENT: Jonathan Hudson  Surgical Pathology Report      Specimen Submitted:  A. Lymph node, left neck   Clinical History: Necrotic node CT, possible squamous cell.  No history  of CA       DIAGNOSIS:  A. LYMPH NODE, LEFT NECK; ULTRASOUND-GUIDED BIOPSY:  - METASTATIC SQUAMOUS CELL CARCINOMA, P16 POSITIVE.  - NECROTIC DEBRIS.   Comment:  The carcinoma is  positive for p63 and p16. This pattern of staining  supports the above diagnosis.  There is insufficient material for ancillary molecular testing.   . Lab Results  Component Value Date   LDH 198 (H) 10/13/2022   Component     Latest Ref Rng 10/13/2022  HIV Screen 4th Generation wRfx     Non Reactive  Non Reactive   HCV Ab     NON REACTIVE  NON REACTIVE   Hepatitis B Surface Ag     NON REACTIVE  NON REACTIVE   Hep B S Ab     NON REACTIVE  NON REACTIVE   Hep B Core Total Ab     NON REACTIVE  NON REACTIVE   CRP     <1.0 mg/dL <0.5   Sed Rate     0 - 16 mm/hr 7   LDH     98 - 192 U/L 198 (H)     Legend: (H) High RADIOGRAPHIC STUDIES: I have personally reviewed the radiological images as listed and agreed with the findings in the report. CT SOFT TISSUE NECK W CONTRAST  Result Date: 10/07/2022 CLINICAL DATA:  Left-sided neck mass in EXAM: CT NECK WITH CONTRAST TECHNIQUE: Multidetector CT imaging of the neck was performed using the standard protocol following the bolus administration of intravenous contrast. RADIATION DOSE REDUCTION: This exam was performed according to the departmental dose-optimization program which includes automated exposure control, adjustment of the mA and/or kV according to patient size and/or use of iterative reconstruction technique. CONTRAST:  3m OMNIPAQUE IOHEXOL 350 MG/ML SOLN COMPARISON:  Neck ultrasound 10/01/2022 FINDINGS: Pharynx and larynx: No primary mass is seen. Salivary glands: No inflammation, mass, or stone. Thyroid: Normal. Lymph nodes: Bulky and necrotic left jugulodigastric node measuring 5.1 cm in length. Given the size and lobulation extracapsular tumor could be present.  No contralateral adenopathy noted. Vascular: Negative. Limited intracranial: Negative. Visualized orbits: Negative Mastoids and visualized paranasal sinuses: Clear Skeleton: Ordinary cervical spine degeneration. No acute or aggressive finding Upper chest: Clear apical lungs.  These results were called by telephone at the time of interpretation on 10/07/2022 at 9:39 am to provider Jonathan Hudson , who verbally acknowledged these results. IMPRESSION: Bulky and necrotic adenopathy in the left level 2 neck, usually metastatic squamous cell carcinoma. No clear primary, but limited at the mass due to dental artifact. Electronically Signed   By: Jorje Guild M.D.   On: 10/07/2022 09:39   US SOFT TISSUE HEAD & NECK (NON-THYROID)  Result Date: 10/01/2022 CLINICAL DATA:  Palpable left neck mass EXAM: ULTRASOUND OF HEAD/NECK SOFT TISSUES TECHNIQUE: Ultrasound examination of the head and neck soft tissues was performed in the area of clinical concern. COMPARISON:  None available FINDINGS: Targeted sonographic evaluation of the area of concern in the left neck demonstrates hypoechoic solid mass measuring 4.7 x 2.7 x 4.2 cm. IMPRESSION: Sonographic evaluation of the area of concern in the left neck demonstrates a hypoechoic solid mass measuring 4.7 x 2.7 x 4.2 cm. This is most likely a pathologically enlarged lymph node. Further evaluation with contrast enhanced soft tissue neck CT is recommended. Electronically Signed   By: Miachel Roux M.D.   On: 10/01/2022 15:37    ASSESSMENT & PLAN  DURAN OHERN is a 57 y.o. 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 Primary site unclear at this time but is likely oropharyngeal given HPV positivity. PLAN I discussed the patient's available lab results in detail with him I discussed the patient's left cervical lymph node biopsy results showing metastatic squamous cell carcinoma which is HPV positive. -We discussed the diagnosis, staging, natural history, prognosis and treatment considerations of metastatic head and neck cancer in detail. -He is scheduled for a PET CT scan on 11/06/2021 for staging of his squamous cell carcinoma as well as to get an understanding of the site of his primary  tumor. -We should initiate referral to Dr. Arville Care from ENT assuming this is a localized tumor to see if he is a surgical candidate. -We shall also refer him to Dr. Benson Norway for dental evaluation in case he needs radiation considerations. -Referral to radiation oncology Dr. Isidore Moos -Initial to phone visit with him after his PET CT scan  Referral to ENT Dr Constance Holster Referral to [facility] denal Dr Debe Coder Referral to Radiation oncology Dr Isidore Moos PET/CT as scheduled on 11/06/2021 Phone visit with Dr Irene Limbo 2-3 days after PET/CT   The total time spent in the appointment was 40 minutes*.  All of the patient's questions were answered with apparent satisfaction. The patient knows to call the clinic with any problems, questions or concerns.   Sullivan Lone MD MS AAHIVMS [facility] Hematology/Oncology Physician [facility]  .*Total Encounter Time as defined by the Centers for Medicare and Medicaid Services includes, in addition to the face-to-face time of a patient visit (documented in the note above) non-face-to-face time: obtaining and reviewing outside history, ordering and reviewing medications, tests or procedures, care coordination (communications with other health care professionals or caregivers) and documentation in the medical record.

## 2022-11-06 NOTE — Addendum Note (Signed)
Addended by: Sullivan Lone on: 11/06/2022 01:12 AM   Modules accepted: Orders

## 2022-11-07 NOTE — Progress Notes (Signed)
Head and Neck Cancer Location of Tumor / Histology: METASTATIC SQUAMOUS CELL CARCINOMA  of head and neck  Patient presented with symptoms of: swollen neck and lymph node swelling  Biopsies revealed:  Specimen Submitted: A. Lymph node, left neck  Clinical History: Necrotic node CT, possible squamous cell.  No history of CA  DIAGNOSIS: A.  LYMPH NODE, LEFT NECK; ULTRASOUND-GUIDED BIOPSY: - METASTATIC SQUAMOUS CELL CARCINOMA, P16 POSITIVE. - NECROTIC DEBRIS.  Comment: The carcinoma is positive for p63 and p16. This pattern of staining supports the above diagnosis. There is insufficient material for ancillary molecular testing.  IHC slides were prepared by Launa Grill, [place]. All controls stained appropriately.  This test was developed and its performance characteristics determined by [facility]. It has not been cleared or approved by the [place] Food and Drug Administration. The FDA does not require this test to go through premarket FDA review. This test is used for clinical purposes. It should not be regarded as investigational or for research. This laboratory is certified under the Clinical Laboratory Improvement Amendments (CLIA) as qualified to perform high complexity clinical laboratory testing.  GROSS DESCRIPTION: A. Labeled: Left neck lymph node Received: Fresh on saline soaked gauze Collection time: 2:10 PM on 10/21/2022 Placed into formalin time: 2:34 PM on 10/21/2022 Tissue fragment(s): Multiple Size: Aggregate, 0.5 x 0.1 x 0.1 cm Description: Received on a Telfa pad are fragments of pink-white soft tissue.  A touch preparation with Diff-Quik staining is performed. Entirely submitted in 1 cassette.  RB 10/21/2022  Final Diagnosis performed by Quay Burow, MD.   Electronically signed 10/23/2022 9:25:54AM   Nutrition Status Yes No Comments  Weight changes? '[]'$  '[x]'$    Swallowing concerns? '[]'$  '[x]'$    PEG? '[]'$  '[x]'$     Referrals Yes No Comments  Social Work? '[]'$  '[x]'$     Dentistry? '[x]'$  '[]'$  2 months ago  Swallowing therapy? '[]'$  '[x]'$    Nutrition? '[]'$  '[x]'$    Med/Onc? '[x]'$  '[]'$  [place]   Safety Issues Yes No Comments  Prior radiation? '[]'$  '[x]'$    Pacemaker/ICD? '[]'$  '[x]'$    Possible current pregnancy? '[]'$  '[x]'$    Is the patient on methotrexate? '[]'$  '[x]'$     Tobacco/Marijuana/Snuff/ETOH use: former smoker  Past/Anticipated interventions by otolaryngology, if any:  Cecile Sheerer DNP 10-07-22   Past/Anticipated interventions by medical oncology, if any: To see Dr. Irene Limbo on 11-17-22  Current Complaints / other details:  Surgery to have tonsils removed on Monday.  Vitals:   11/18/22 0844  BP: (!) 154/98  Pulse: 68  Resp: 20  Temp: 98.1 F (36.7 C)  SpO2: 100%

## 2022-11-10 ENCOUNTER — Telehealth: Payer: 59 | Admitting: Hematology

## 2022-11-12 DIAGNOSIS — R590 Localized enlarged lymph nodes: Secondary | ICD-10-CM | POA: Insufficient documentation

## 2022-11-13 ENCOUNTER — Encounter (HOSPITAL_COMMUNITY)
Admission: RE | Admit: 2022-11-13 | Discharge: 2022-11-13 | Disposition: A | Discharge status: Home / Self Care | Payer: 59 | Source: Ambulatory Visit | Attending: Physician Assistant | Admitting: Physician Assistant

## 2022-11-13 DIAGNOSIS — C4492 Squamous cell carcinoma of skin, unspecified: Secondary | ICD-10-CM | POA: Insufficient documentation

## 2022-11-13 MED ORDER — FLUDEOXYGLUCOSE F - 18 (FDG) INJECTION
12.9000 | Freq: Once | INTRAVENOUS | Status: AC | PRN
  Administered 2022-11-13: 12.9 via INTRAVENOUS

## 2022-11-14 ENCOUNTER — Other Ambulatory Visit (HOSPITAL_COMMUNITY): Payer: 59

## 2022-11-14 NOTE — Progress Notes (Signed)
Oncology Nurse Navigator Documentation   Placed introductory call to new referral patient Jonathan Hudson. Introduced myself as the H&N oncology nurse navigator that works with Dr. Isidore Moos to whom he has been referred by Dr. Irene Limbo. He confirmed understanding of referral. Briefly explained my role as his navigator, provided my contact information.  Confirmed understanding of upcoming appts and [place] location, explained arrival and registration process. I explained the purpose of a dental evaluation prior to starting RT, indicated he would be contacted by an oral surgeon to arrange an appt.   I encouraged him to call with questions/concerns as he moves forward with appts and procedures.   He verbalized understanding of information provided, expressed appreciation for my call.   Navigator Initial Assessment Employment Status: he is employed Currently on [employer] / STD: no Living Situation: Support System: family PCP: Allyn Kenner MD PCD: Financial Concerns: no Transportation Needs: no Sensory Deficits: no Engineer, building services Needed:  no Ambulation Needs: no Psychosocial Needs:  no Concerns/Needs Understanding Cancer:  addressed/answered by navigator to best of ability Self-Expressed Needs: no   Clinical biochemist, BSN, OCN Head & Neck Oncology Nurse [place] [facility] Phone # [phone]  Fax # [phone]

## 2022-11-17 ENCOUNTER — Other Ambulatory Visit: Payer: Self-pay

## 2022-11-17 ENCOUNTER — Encounter (HOSPITAL_BASED_OUTPATIENT_CLINIC_OR_DEPARTMENT_OTHER)
Admission: RE | Admit: 2022-11-17 | Discharge: 2022-11-17 | Disposition: A | Discharge status: Home / Self Care | Payer: 59 | Source: Ambulatory Visit | Attending: Otolaryngology | Admitting: Otolaryngology

## 2022-11-17 ENCOUNTER — Encounter (HOSPITAL_BASED_OUTPATIENT_CLINIC_OR_DEPARTMENT_OTHER): Payer: Self-pay | Admitting: Otolaryngology

## 2022-11-17 ENCOUNTER — Inpatient Hospital Stay: Payer: 59 | Attending: Physician Assistant | Admitting: Hematology

## 2022-11-17 DIAGNOSIS — C4492 Squamous cell carcinoma of skin, unspecified: Secondary | ICD-10-CM

## 2022-11-17 DIAGNOSIS — Z01810 Encounter for preprocedural cardiovascular examination: Secondary | ICD-10-CM | POA: Diagnosis not present

## 2022-11-17 DIAGNOSIS — I10 Essential (primary) hypertension: Secondary | ICD-10-CM | POA: Insufficient documentation

## 2022-11-17 DIAGNOSIS — I493 Ventricular premature depolarization: Secondary | ICD-10-CM | POA: Insufficient documentation

## 2022-11-17 DIAGNOSIS — C760 Malignant neoplasm of head, face and neck: Secondary | ICD-10-CM | POA: Diagnosis not present

## 2022-11-17 NOTE — H&P (Signed)
HPI:  Jonathan Hudson is a 57 y.o. male who presents as a consult Patient.  Referring Provider: Cecile Sheerer, NP  Chief complaint: Neck mass.  HPI: History of a left neck mass that came up suddenly in October. He underwent needle biopsy recently and was told it was cancerous. He has a PET scan scheduled for later this week. He quit smoking a few weeks ago. He denies any sore throat, trouble swallowing, ear pain.  PMH/Meds/All/SocHx/FamHx/ROS:  History reviewed. No pertinent past medical history.  History reviewed. No pertinent surgical history.  No family history of bleeding disorders, wound healing problems or difficulty with anesthesia.  Social History  Socioeconomic History  Marital status: Single Spouse name: Not on file  Number of children: Not on file  Years of education: Not on file  Highest education level: Not on file Occupational History  Not on file Tobacco Use  Smoking status: Former Packs/day: .25 Types: Cigarettes, Cigars  Smokeless tobacco: Never Vaping Use  Vaping Use: Never used Substance and Sexual Activity  Alcohol use: Yes Comment: social  Drug use: Never  Sexual activity: Not on file Other Topics Concern  Not on file Social History Narrative  Not on file  Social Determinants of Health  Food Insecurity: Not on file Transportation Needs: Not on file Living Situation: Not on file  Current Outpatient Medications:  amLODIPine (NORVASC) 10 MG tablet, Take 1 tablet (10 mg total) by mouth daily., Disp: , Rfl:  atorvastatin (LIPITOR) 40 MG tablet, Take 1 tablet (40 mg total) by mouth daily., Disp: , Rfl:  lansoprazole (PREVACID) 15 MG capsule, Take 1 capsule (15 mg total) by mouth every morning., Disp: , Rfl:  olmesartan (BENICAR) 40 MG tablet, TAKE 1 TABLET BY MOUTH ONCE DAILY ( STOP LISINOPRIL), Disp: , Rfl:  A complete ROS was performed with pertinent positives/negatives noted in the HPI. The remainder of the ROS are negative.   Physical  Exam:  Pulse 72  Temp 96.8 F (36 C) (Temporal)  Resp 18  Ht 1.88 m ('6\' 2"'$ )  Wt 111.1 kg (245 lb)  SpO2 96%  BMI 31.46 kg/m  General: Healthy and alert, in no distress, breathing easily. Normal affect. In a pleasant mood. Head: Normocephalic, atraumatic. No masses, or scars. Eyes: Pupils are equal, and reactive to light. Vision is grossly intact. No spontaneous or gaze nystagmus. Ears: Ear canals are clear. Tympanic membranes are intact, with normal landmarks and the middle ears are clear and healthy. Hearing: Grossly normal. Nose: Nasal cavities are clear with healthy mucosa, no polyps or exudate. Airways are patent. Face: No masses or scars, facial nerve function is symmetric. Oral Cavity: No mucosal abnormalities are noted. Tongue with normal mobility. Dentition appears healthy. Oropharynx: Tonsils are present, the left side appears to be slightly larger with a slight erythema and slightly indurated but no obvious mass palpated or visualized. There are no mucosal masses identified. Tongue base appears normal and healthy. Larynx/Hypopharynx: indirect exam reveals healthy, mobile vocal cords, without mucosal lesions in the hypopharynx or larynx. Chest: Deferred Neck: Large left level 2 mass approximately 7 cm, no other adenopathy, no thyroid nodules or enlargement. Neuro: Cranial nerves II-XII with normal function. Balance: Normal gate. Other findings: none.  Independent Review of Additional Tests or Records: FNA:  SURGICAL PATHOLOGY CASE: [id number] PATIENT: Jonathan Hudson Surgical Pathology Report  Specimen Submitted: A. Lymph node, left neck  Clinical History: Necrotic node CT, possible squamous cell.  No history of CA  DIAGNOSIS: A.  LYMPH NODE, LEFT NECK;  ULTRASOUND-GUIDED BIOPSY: - METASTATIC SQUAMOUS CELL CARCINOMA, P16 POSITIVE. - NECROTIC DEBRIS.  CT neck:  IMPRESSION: Bulky and necrotic adenopathy in the left level 2 neck, usually metastatic squamous  cell carcinoma. No clear primary, but limited at the mass due to dental artifact.  Procedures: none  Impression & Plans: Metastatic carcinoma left level 2 node likely oropharyngeal source. P16 is positive. Await PET scan results and then we will discuss operative endoscopy to try to identify a primary site.

## 2022-11-17 NOTE — Progress Notes (Signed)
Radiation Oncology         ([phone]) [phone] ________________________________  Initial Outpatient Consultation  Name: Jonathan Jonathan MRN: [id number]  Date: 11/18/2022  DOB: 01/26/1966  NU:UVOZ, Edwinna Areola, MD  Brunetta Genera, MD   REFERRING PHYSICIAN: Brunetta Genera, MD  DIAGNOSIS: C09.0   ICD-10-CM   1. Head and neck cancer ([place])  C76.0     2. 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 Presented with palpable left neck lymphadenopathy in November 2023  CHIEF COMPLAINT: Here to discuss management of neck/tonsillar cancer  Jonathan Jonathan is a 57 y.o. male who presented to the [facility] urgent care on 09/04/22 with a large painful swollen lump to the left side of his neck that appeared the day prior. Physical exam performed in the urgent care noted a 3-4 cm localized firm mass to the left neck near his jaw line. Clinical impression at the time included an infected salivary gland vs lymphadenopathy, and he was treated with abx (doxycycline) and anti-inflammatory pain management.   He followed up with his PCP on 09/22/2022 who ordered a head and neck ultrasound on 10/01/2022 which revealed a hypoechoic solid mass in the left neck measuring 4.7 x 2.7 x 4.2 cm. Soft tissue neck CT later performed on 10/06/2022 further revealed bulky and necrotic adenopathy in the left level 2 neck consisting of an enlarged left jugulodigastric node measuring 5.1 cm.  Despite antibiotics, the patient's mass/pain hardly improved. Subsequently, he was referred to Dr. Irene Limbo on 10/13/22 for further evaluation and management. The patient denied any other concerns at the time  of this visit other than intermittent episodes of positional dizziness without syncopal episodes. Labs collected during this visit were notable for an increased number of CD4/CD8 double positive T-cells. No monoclonal B-cell population was identified.   Accordingly, the patient underwent biopsy of the abnormal left neck lymph node on 10/21/22. Pathology showed findings consistent with metastatic squamous cell carcinoma; p16 positive (with necrotic debris).   Pertinent imaging thus far includes a PET scan performed on 11/13/22 which demonstrates: asymmetric thickening along the left oropharynx with intense hypermetabolic activity in the left palatine tonsil, compatible with primary neoplasm; the hypermetabolic centrally necrotic left jugulodigastric level 2 lymph node consistent with nodal disease; and nonspecific sublingual hypermetabolic activity in the anterior mouth possibly reflecting physiologic activity. PET otherwise shows no evidence of hypermetabolic metastatic disease in the chest, abdomen or pelvis.  The patient met with Dr. Constance Holster in consultation 11/12/22. Based on his recent PET scan which shows evidence of a tonsillar primary, the patient has agreed to proceed with a left tonsillectomy per Dr. Janeice Robinson recommendation. His surgery is scheduled for 11/24/22 with Dr. Constance Holster.   Swallowing issues, if any: none  Weight Changes: none  Pain status: pain associated with left neck swelling  Other symptoms: none  Tobacco history, if any: recently quit smoking around the time of his diagnosis, smoking use included 1/4 ppd (uncertain of how many pack years)  ETOH abuse, if any: none  Prior cancers, if any: none  PREVIOUS RADIATION THERAPY: No  PAST MEDICAL HISTORY:  has a past medical history of Boils (01/01/2017), Hypercholesteremia, and Hypertension.  PAST SURGICAL HISTORY: Past Surgical History:  Procedure Laterality Date   COLONOSCOPY N/A 01/29/2017   Procedure: COLONOSCOPY;   Surgeon: Rogene Houston, MD;  [facility];  Service: Endoscopy;  Laterality: N/A;  730   COLONOSCOPY WITH PROPOFOL N/A 04/03/2022   Procedure: COLONOSCOPY WITH PROPOFOL;  Surgeon: Rogene Houston, MD;  [facility]: [facility];  Service: Endoscopy;  Laterality: N/A;  730   cyst removed from left wrist      EYE SURGERY     grafts N/A 10  years  ago   grafts to gums   HEMOSTASIS CLIP PLACEMENT  04/03/2022   Procedure: HEMOSTASIS CLIP PLACEMENT;  Surgeon: Rogene Houston, MD;  [facility];  Service: Endoscopy;;   OPEN REDUCTION INTERNAL FIXATION (ORIF) DISTAL RADIAL FRACTURE Right 12/28/2013   Procedure: OPEN REDUCTION INTERNAL FIXATION (ORIF) DISTAL RADIAL FRACTURE;  Surgeon: Carole Civil, MD;  Location: [facility];  Service: Orthopedics;  Laterality: Right;   ORIF ULNAR FRACTURE Right 12/28/2013   Procedure: OPEN REDUCTION INTERNAL FIXATION (ORIF) ULNAR FRACTURE;  Surgeon: Carole Civil, MD;  Location: [facility];  Service: Orthopedics;  Laterality: Right;   POLYPECTOMY  04/03/2022   Procedure: POLYPECTOMY;  Surgeon: Rogene Houston, MD;  [facility];  Service: Endoscopy;;   right knee Right teenager   right knee     FAMILY HISTORY: family history includes Brain cancer in his maternal uncle; Breast cancer in his cousin; Cancer in his maternal aunt, mother, paternal aunt, and paternal uncle; Pancreatic cancer in his father.  SOCIAL HISTORY:  reports that he has never smoked. He has never used smokeless tobacco. He reports current alcohol use. He reports that he does not use drugs.  ALLERGIES: Patient has no known allergies.  MEDICATIONS:  Current Outpatient Medications  Medication Sig Dispense Refill   amLODipine (NORVASC) 10 MG tablet Take 10 mg by mouth daily.     atorvastatin (LIPITOR) 40 MG tablet Take 40 mg by mouth daily.     lansoprazole (PREVACID) 15 MG capsule Take 15 mg by mouth every morning.     Multiple Vitamin (MULTIVITAMIN WITH  MINERALS) TABS tablet Take 1 tablet by mouth daily.     olmesartan (BENICAR) 40 MG tablet Take 40 mg by mouth daily.     doxycycline (VIBRAMYCIN) 100 MG capsule Take 1 capsule (100 mg total) by mouth 2 (two) times daily. 20 capsule 0   sildenafil (VIAGRA) 100 MG tablet Take 100 mg by mouth daily as needed for erectile dysfunction. (Patient not taking: Reported on 11/18/2022)  5   No current facility-administered medications for this encounter.    REVIEW OF SYSTEMS:  Notable for that above.   PHYSICAL EXAM:  height is '6\' 2"'$  (1.88 m) and weight is 247 lb (112 kg). His temperature is 98.1 F (36.7 C). His blood pressure is 154/98 (abnormal) and his pulse is 68. His respiration is 20 and oxygen saturation is 100%.   General: Alert and oriented, in no acute distress HEENT: Head is normocephalic. Extraocular movements are intact. Mild left mouth droop. Left tonsil is slightly enlarged with pronounced capillaries in the mucosa. Slight swelling to the left buccal mucosa. No palpable masses in buccal mucosa. Floor of mouth and tongue are clear to visual inspection and palpation.  Neck: Neck is notable for a non tender, fixed node in the left neck, level II region at least 5 cm in dimension. No other masses palpated in his neck.  Heart: Regular  in rate and rhythm with no murmurs, rubs, or gallops. Chest: Clear to auscultation bilaterally, with no rhonchi, wheezes, or rales. Abdomen: Soft, nontender, nondistended, with no rigidity or guarding. Extremities: No cyanosis or edema. Lymphatics: see Neck Exam Skin: No concerning lesions. Musculoskeletal: symmetric strength and muscle tone throughout. Neurologic: Cranial nerves II through XII are grossly intact. No obvious focalities. Speech is fluent. Coordination is intact. Psychiatric: Judgment and insight are intact. Affect is appropriate.   ECOG = 1  0 - Asymptomatic (Fully active, able to carry on all predisease activities without restriction)  1 -  Symptomatic but completely ambulatory (Restricted in physically strenuous activity but ambulatory and able to carry out work of a light or sedentary nature. For example, light housework, office work)  2 - Symptomatic, <50% in bed during the day (Ambulatory and capable of all self care but unable to carry out any work activities. Up and about more than 50% of waking hours)  3 - Symptomatic, >50% in bed, but not bedbound (Capable of only limited self-care, confined to bed or chair 50% or more of waking hours)  4 - Bedbound (Completely disabled. Cannot carry on any self-care. Totally confined to bed or chair)  5 - Death   Eustace Pen MM, Creech RH, Tormey DC, et al. [phone]). "Toxicity and response criteria of the [facility]". [place] Oncol. 5 (6): 649-55   LABORATORY DATA:  Lab Results  Component Value Date   WBC 11.4 (H) 10/30/2022   HGB 15.5 10/30/2022   HCT 45.1 10/30/2022   MCV 90.4 10/30/2022   PLT 276 10/30/2022   CMP     Component Value Date/Time   NA 140 10/30/2022 1141   K 4.3 10/30/2022 1141   CL 105 10/30/2022 1141   CO2 31 10/30/2022 1141   GLUCOSE 117 (H) 10/30/2022 1141   BUN 15 10/30/2022 1141   CREATININE 0.94 10/30/2022 1141   CALCIUM 9.4 10/30/2022 1141   PROT 7.5 10/30/2022 1141   ALBUMIN 4.1 10/30/2022 1141   AST 35 10/30/2022 1141   ALT 49 (H) 10/30/2022 1141   ALKPHOS 74 10/30/2022 1141   BILITOT 0.4 10/30/2022 1141   GFRNONAA >60 10/30/2022 1141   GFRAA 87 (L) 12/28/2013 0152      No results found for: "TSH"   RADIOGRAPHY: NM PET Image Initial (PI) Skull Base To Thigh  Result Date: 11/13/2022 CLINICAL DATA:  Initial treatment strategy for squamous cell carcinoma of the neck. EXAM: NUCLEAR MEDICINE PET SKULL BASE TO THIGH TECHNIQUE: 12.9 mCi F-18 FDG was injected intravenously. Full-ring PET imaging was performed from the skull base to thigh after the radiotracer. CT data was obtained and used for attenuation correction and  anatomic localization. Fasting blood glucose: 110 mg/dl COMPARISON:  Neck CT October 06, 2022 FINDINGS: Mediastinal blood pool activity: SUV max 1.8 Liver activity: SUV max NA NECK: Hypermetabolic centrally necrotic left jugulodigastric lymph node measures 3.8 x 3.0 cm on image 79/3 with a max SUV of 10.8. Asymmetric thickening along the left oropharynx with intense hypermetabolic activity in the left palatine tonsil max SUV of 15.8. Sublingual hypermetabolic activity in the anterior mouth with a max SUV of 8.2. Incidental CT findings: Streak artifact from dental hardware. CHEST: Hypermetabolic thoracic adenopathy. No hypermetabolic pulmonary nodules or masses. Incidental CT findings: Borderline cardiac enlargement. Patulous esophagus. ABDOMEN/PELVIS: No abnormal hypermetabolic activity within the liver, pancreas, adrenal glands, or spleen. No hypermetabolic lymph nodes in the abdomen or pelvis. Incidental CT findings: Bilobar hepatic hypodensities  measuring fluid density compatible with cysts with additional hypodensities technically too small to accurately characterize but statistically likely to reflect cysts or hemangiomas. Colonic diverticulosis without findings of acute diverticulitis. Enlarged prostate gland. Fat containing right inguinal hernia. SKELETON: No focal hypermetabolic activity to suggest skeletal metastasis. Incidental CT findings: Plate and screw fixation hardware in the right forearm. IMPRESSION: 1. Asymmetric thickening along the left oropharynx with intense hypermetabolic activity in the left palatine tonsil, compatible with primary neoplasm. 2. Hypermetabolic centrally necrotic left jugulodigastric level 2 lymph node consistent with nodal disease. 3. Sublingual hypermetabolic activity in the anterior mouth, nonspecific and possibly reflecting physiologic activity. Consider direct visualization. 4. No evidence of hypermetabolic metastatic disease in the chest, abdomen or pelvis. 5. Colonic  diverticulosis without findings of acute diverticulitis. 6. Fat containing right inguinal hernia. Electronically Signed   By: Dahlia Bailiff M.D.   On: 11/13/2022 12:56   [place] CORE BIOPSY (LYMPH NODES)  Result Date: 10/21/2022 INDICATION: 57 year old male presents for biopsy of left-sided chronic cervical lymph node EXAM: ULTRASOUND-GUIDED BIOPSY OF LEFT CERVICAL LYMPH NODE ULTRASOUND-GUIDED ASPIRATION OF NECROTIC LEFT CERVICAL LYMPH NODE MEDICATIONS: None. ANESTHESIA/SEDATION: None COMPLICATIONS: None PROCEDURE: Informed written consent was obtained from the patient after a thorough discussion of the procedural risks, benefits and alternatives. All questions were addressed. Maximal Sterile Barrier Technique was utilized including caps, mask, sterile gowns, sterile gloves, sterile drape, hand hygiene and skin antiseptic. A timeout was performed prior to the initiation of the procedure. Ultrasound survey was performed with images stored and sent to PACs. The left neck was prepped with chlorhexidine in a sterile fashion, and a sterile drape was applied covering the operative field. A sterile gown and sterile gloves were used for the procedure. Local anesthesia was provided with 1% Lidocaine. Ultrasound guidance was used to infiltrate the region with 1% lidocaine for local anesthesia. Four separate 18 gauge core needle biopsy were then acquired of the necrotic left cervical lymph node using ultrasound guidance. Images were stored. 18 gauge needle was then advanced into the node with aspiration approximately 8 cc of purulent material. Final image was stored after biopsy. Patient tolerated the procedure well and remained hemodynamically stable throughout. No complications were encountered and no significant blood loss was encounter IMPRESSION: Status post ultrasound-guided biopsy and aspiration of necrotic left cervical lymph node. Signed, Dulcy Fanny. Nadene Rubins, RPVI [facility] Radiology Electronically Signed   By: Corrie Mckusick D.O.   On: 10/21/2022 15:18   [place] FNA SOFT TISSUE  Result Date: 10/21/2022 INDICATION: 57 year old male presents for biopsy of left-sided chronic cervical lymph node EXAM: ULTRASOUND-GUIDED BIOPSY OF LEFT CERVICAL LYMPH NODE ULTRASOUND-GUIDED ASPIRATION OF NECROTIC LEFT CERVICAL LYMPH NODE MEDICATIONS: None. ANESTHESIA/SEDATION: None COMPLICATIONS: None PROCEDURE: Informed written consent was obtained from the patient after a thorough discussion of the procedural risks, benefits and alternatives. All questions were addressed. Maximal Sterile Barrier Technique was utilized including caps, mask, sterile gowns, sterile gloves, sterile drape, hand hygiene and skin antiseptic. A timeout was performed prior to the initiation of the procedure. Ultrasound survey was performed with images stored and sent to PACs. The left neck was prepped with chlorhexidine in a sterile fashion, and a sterile drape was applied covering the operative field. A sterile gown and sterile gloves were used for the procedure. Local anesthesia was provided with 1% Lidocaine. Ultrasound guidance was used to infiltrate the region with 1% lidocaine for local anesthesia. Four separate 18 gauge core needle biopsy were then acquired of  the necrotic left cervical lymph node using ultrasound guidance. Images were stored. 18 gauge needle was then advanced into the node with aspiration approximately 8 cc of purulent material. Final image was stored after biopsy. Patient tolerated the procedure well and remained hemodynamically stable throughout. No complications were encountered and no significant blood loss was encounter IMPRESSION: Status post ultrasound-guided biopsy and aspiration of necrotic left cervical lymph node. Signed, Dulcy Fanny. Nadene Rubins, RPVI [facility] Radiology Electronically Signed   By: Corrie Mckusick D.O.   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  Highly suspect extracapsular extension from neck mass based on physical exam and imaging   Patient's case will be presented in our multidisciplinary conference tomorrow to discuss treatment options at this point. I explained to the patient that tonsil biopsy may not be necessary if we are confident of tonsil primary from imaging. I also discussed that surgery or chemotherapy may be recommended depending on our discussion with the tumor board. Regardless of our decision, this patient is a good candidate for 7 weeks of definitive radiation therapy to the left tonsil and bilateral neck.  Anticipate that his best treatment option will be concurrent chemoradiation.  I do not think that surgery is in his best interest due to high suspicion for extracapsular extension from the lymph node.  Although he technically is stage I cancer, the size of his node and extracapsular extension likelihood, as well as his young age, make me recommend that he see medical oncology to strongly consider concurrent chemotherapy for chemosensitization  We discussed the potential risks, benefits, and side effects of radiotherapy. We talked in detail about acute and late effects. We discussed that some of the most bothersome acute effects may be mucositis, dysgeusia, salivary changes, skin irritation, hair loss, dehydration, weight loss and fatigue. We talked about late effects which include but are not necessarily limited to dysphagia, hypothyroidism, nerve injury, vascular injury, spinal cord injury, xerostomia, trismus, neck edema, and potential injury to any of the tissues in the head and neck region. No guarantees of treatment were given. A consent form was signed and placed in the patient's medical record. The patient is enthusiastic about proceeding with treatment. I look forward to participating in the  patient's care.    Simulation (treatment planning) will take place once scatter protection devices are made by dentistry; he also has a pending preradiation dental evaluation with Dr. Frederik Schmidt.  I do not anticipate more than 50 Gray to any of his tooth roots  We also discussed that the treatment of head and neck cancer is a multidisciplinary process to maximize treatment outcomes and quality of life. For this reason the following referrals have been or will be made:   Medical oncology to discuss chemotherapy.  Our navigator will make sure he is scheduled to see Dr. Irene Limbo again   Nutritionist for nutrition support during and after treatment.   Speech language pathology for swallowing and/or speech therapy.   Social work for social support.    Physical therapy due to risk of lymphedema in neck and deconditioning.   Baseline labs including TSH.  I personally discussed his case today with Dr. Constance Holster.  I applauded the patient on smoking cessation which is important to have the best prognosis.  On date of service, in total, I spent 60 minutes on this encounter. Patient was seen in person.  __________________________________________   Leona Singleton, PA    Eppie Gibson, MD  This document serves as a record of services personally performed by Eppie Gibson, MD. It was created on her behalf by Roney Mans, a trained medical scribe. The creation of this record is based on the scribe's personal observations and the provider's statements to them. This document has been checked and approved by the attending provider.

## 2022-11-17 NOTE — Progress Notes (Signed)
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 male with medical history significant for hypertension and hypercholesteremia presents to the diagnostic clinic for evaluation for left neck lymphadenopathy.  On review of the previous records, Mr. Croom presented to the urgent care on 09/04/2022 with a large painful left neck mass. It was initially felt to be infectious so treated with doxycycline with little improvement. He followed by with his PCP on 09/22/2022 who ordered a ultrasound on 10/01/2022 which revealed a hypoechoic solid mass in the left neck measuring 4.7 x 2.7 x 4.2 cm. CT neck followed on 10/06/2022 which showed bulky and necrotic adenopathy in the left level 2 neck.   On exam today, Mr. Klingerman reports that his energy levels are fairly stable. He is able to complete all his ADLs on his own. He is currently on FMLA while he undergoes workup. He reports having a good appetite and denies any weight loss. He denies nausea, vomiting or abdominal pain. His bowel habits are unchanged without any recurrent episodes of diarrhea or constipation. He denies easy bruising or signs of active bleeding. He reports having intermittent episodes of positional dizziness without any syncopal episodes.He denies fevers, chills, night sweats, shortness of breath, chest pain, cough, peripheral edema, neuropathy, pruritus or rash. He has no other complaints. Rest of the 10 point ROS is below.   INTERVAL HISTORY  ERNIE KASLER 57 y.o. male is connected via phone for continued evaluation and management of his newly diagnosed  metastatic head and neck squamous cell carcinoma. .I connected with Jonathan Hudson on 11/17/2022 at  3:30 PM EST by telephone visit and verified that I am speaking with the correct person using two identifiers.   Patient was last seen by me on 10/30/2022 and was doing well overall.   Patient reports he has been doing well without any new medical concerns since our last visit. He denies fever, chills, night sweats, abdominal pain, back pain, or leg swelling.   Patient notes he has been to ENT physician and he will be having Tonsillectomy to remove his localized tumor near his left tonsil. His sister notes that the patient is having his tonsillectomy on 11/24/2022.   I discussed the limitations, risks, security and privacy concerns of performing an evaluation and management service by telemedicine and the availability of in-person appointments. I also discussed with the patient that there may be a patient responsible charge related to this service. The patient expressed understanding and agreed to proceed.   Other persons participating in the visit and their role in the encounter: Sister   Patient's location: Home  Provider's location: [facility]   Chief Complaint:  head neck squamous cell carcinoma     MEDICAL HISTORY:  Past Medical History:  Diagnosis Date   Boils 01/01/2017   Hypercholesteremia    Hypertension     SURGICAL HISTORY: Past Surgical History:  Procedure Laterality Date   COLONOSCOPY N/A 01/29/2017   Procedure: COLONOSCOPY;  Surgeon: Rogene Houston, MD;  [facility];  Service: Endoscopy;  Laterality: N/A;  730   COLONOSCOPY WITH PROPOFOL N/A 04/03/2022   Procedure: COLONOSCOPY WITH PROPOFOL;  Surgeon: Hildred Laser  U, MD;  Location: [facility];  Service: Endoscopy;  Laterality: N/A;  730   cyst removed from left wrist      EYE SURGERY     grafts N/A 10  years  ago   grafts to gums   HEMOSTASIS CLIP PLACEMENT  04/03/2022   Procedure: HEMOSTASIS CLIP  PLACEMENT;  Surgeon: Rogene Houston, MD;  [facility];  Service: Endoscopy;;   OPEN REDUCTION INTERNAL FIXATION (ORIF) DISTAL RADIAL FRACTURE Right 12/28/2013   Procedure: OPEN REDUCTION INTERNAL FIXATION (ORIF) DISTAL RADIAL FRACTURE;  Surgeon: Carole Civil, MD;  Location: [facility];  Service: Orthopedics;  Laterality: Right;   ORIF ULNAR FRACTURE Right 12/28/2013   Procedure: OPEN REDUCTION INTERNAL FIXATION (ORIF) ULNAR FRACTURE;  Surgeon: Carole Civil, MD;  Location: [facility];  Service: Orthopedics;  Laterality: Right;   POLYPECTOMY  04/03/2022   Procedure: POLYPECTOMY;  Surgeon: Rogene Houston, MD;  [facility];  Service: Endoscopy;;   right knee Right teenager   right knee     SOCIAL HISTORY: Social History   Socioeconomic History   Marital status: Divorced    Spouse name: Not on file   Number of children: Not on file   Years of education: Not on file   Highest education level: Not on file  Occupational History   Not on file  Tobacco Use   Smoking status: Never   Smokeless tobacco: Never  Vaping Use   Vaping Use: Never used  Substance and Sexual Activity   Alcohol use: Yes    Comment: Occ  beer or wine    Drug use: No   Sexual activity: Not on file  Other Topics Concern   Not on file  Social History Narrative   Not on file   Social Determinants of Health   Financial Resource Strain: Not on file  Food Insecurity: Not on file  Transportation Needs: Not on file  Physical Activity: Not on file  Stress: Not on file  Social Connections: Not on file  Intimate Partner Violence: Not on file    FAMILY HISTORY: Family History  Problem Relation Age of Onset   Cancer Mother        small bowel spread to liver   Pancreatic cancer Father    Cancer Maternal Aunt    Brain cancer Maternal Uncle    Cancer Paternal Aunt    Breast cancer Cousin    Cancer Paternal Uncle     ALLERGIES:  has No Known Allergies.  MEDICATIONS:  Current  Outpatient Medications  Medication Sig Dispense Refill   amLODipine (NORVASC) 10 MG tablet Take 10 mg by mouth daily.     atorvastatin (LIPITOR) 40 MG tablet Take 40 mg by mouth daily.     doxycycline (VIBRAMYCIN) 100 MG capsule Take 1 capsule (100 mg total) by mouth 2 (two) times daily. 20 capsule 0   lansoprazole (PREVACID) 15 MG capsule Take 15 mg by mouth every morning.     Multiple Vitamin (MULTIVITAMIN WITH MINERALS) TABS tablet Take 1 tablet by mouth daily.     olmesartan (BENICAR) 40 MG tablet Take 40 mg by mouth daily.     sildenafil (VIAGRA) 100 MG tablet Take 100 mg by mouth daily as needed for erectile dysfunction.  5   No current facility-administered medications for this visit.    REVIEW OF SYSTEMS:   10 Point review of Systems was done is negative except as noted above.  PHYSICAL EXAMINATION:telemedicine visit  LABORATORY  DATA:  I have reviewed the data as listed .    Latest Ref Rng & Units 10/30/2022   11:41 AM 10/13/2022   10:06 AM 12/28/2013    1:52 AM  CBC  WBC 4.0 - 10.5 K/uL 11.4  9.5  15.0   Hemoglobin 13.0 - 17.0 g/dL 15.5  15.5  15.0   Hematocrit 39.0 - 52.0 % 45.1  45.6  43.2   Platelets 150 - 400 K/uL 276  268  238    .    Latest Ref Rng & Units 10/30/2022   11:41 AM 10/13/2022   10:06 AM 12/28/2013    1:52 AM  CMP  Glucose 70 - 99 mg/dL 117  107  146   BUN 6 - 20 mg/dL '15  12  17   '$ Creatinine 0.61 - 1.24 mg/dL 0.94  0.98  1.14   Sodium 135 - 145 mmol/L 140  141  141   Potassium 3.5 - 5.1 mmol/L 4.3  4.4  3.5   Chloride 98 - 111 mmol/L 105  107  103   CO2 22 - 32 mmol/L '31  30  24   '$ Calcium 8.9 - 10.3 mg/dL 9.4  9.7  8.9   Total Protein 6.5 - 8.1 g/dL 7.5  6.9    Total Bilirubin 0.3 - 1.2 mg/dL 0.4  0.5    Alkaline Phos 38 - 126 U/L 74  79    AST 15 - 41 U/L 35  20    ALT 0 - 44 U/L 49  20        SURGICAL PATHOLOGY  CASE: [id number]  PATIENT: [name]  Surgical Pathology Report      Specimen Submitted:  A. Lymph node,  left neck   Clinical History: Necrotic node CT, possible squamous cell.  No history  of CA       DIAGNOSIS:  A. LYMPH NODE, LEFT NECK; ULTRASOUND-GUIDED BIOPSY:  - METASTATIC SQUAMOUS CELL CARCINOMA, P16 POSITIVE.  - NECROTIC DEBRIS.   Comment:  The carcinoma is positive for p63 and p16. This pattern of staining  supports the above diagnosis.  There is insufficient material for ancillary molecular testing.   . Lab Results  Component Value Date   LDH 198 (H) 10/13/2022   Component     Latest Ref Rng 10/13/2022  HIV Screen 4th Generation wRfx     Non Reactive  Non Reactive   HCV Ab     NON REACTIVE  NON REACTIVE   Hepatitis B Surface Ag     NON REACTIVE  NON REACTIVE   Hep B S Ab     NON REACTIVE  NON REACTIVE   Hep B Core Total Ab     NON REACTIVE  NON REACTIVE   CRP     <1.0 mg/dL <0.5   Sed Rate     0 - 16 mm/hr 7   LDH     98 - 192 U/L 198 (H)     Legend: (H) High RADIOGRAPHIC STUDIES: I have personally reviewed the radiological images as listed and agreed with the findings in the report. NM PET Image Initial (PI) Skull Base To Thigh  Result Date: 11/13/2022 CLINICAL DATA:  Initial treatment strategy for squamous cell carcinoma of the neck. EXAM: NUCLEAR MEDICINE PET SKULL BASE TO THIGH TECHNIQUE: 12.9 mCi F-18 FDG was injected intravenously. Full-ring PET imaging was performed from the skull base to thigh after the radiotracer. CT data was obtained and used for attenuation correction and  anatomic localization. Fasting blood glucose: 110 mg/dl COMPARISON:  Neck CT October 06, 2022 FINDINGS: Mediastinal blood pool activity: SUV max 1.8 Liver activity: SUV max NA NECK: Hypermetabolic centrally necrotic left jugulodigastric lymph node measures 3.8 x 3.0 cm on image 79/3 with a max SUV of 10.8. Asymmetric thickening along the left oropharynx with intense hypermetabolic activity in the left palatine tonsil max SUV of 15.8. Sublingual hypermetabolic activity in the  anterior mouth with a max SUV of 8.2. Incidental CT findings: Streak artifact from dental hardware. CHEST: Hypermetabolic thoracic adenopathy. No hypermetabolic pulmonary nodules or masses. Incidental CT findings: Borderline cardiac enlargement. Patulous esophagus. ABDOMEN/PELVIS: No abnormal hypermetabolic activity within the liver, pancreas, adrenal glands, or spleen. No hypermetabolic lymph nodes in the abdomen or pelvis. Incidental CT findings: Bilobar hepatic hypodensities measuring fluid density compatible with cysts with additional hypodensities technically too small to accurately characterize but statistically likely to reflect cysts or hemangiomas. Colonic diverticulosis without findings of acute diverticulitis. Enlarged prostate gland. Fat containing right inguinal hernia. SKELETON: No focal hypermetabolic activity to suggest skeletal metastasis. Incidental CT findings: Plate and screw fixation hardware in the right forearm. IMPRESSION: 1. Asymmetric thickening along the left oropharynx with intense hypermetabolic activity in the left palatine tonsil, compatible with primary neoplasm. 2. Hypermetabolic centrally necrotic left jugulodigastric level 2 lymph node consistent with nodal disease. 3. Sublingual hypermetabolic activity in the anterior mouth, nonspecific and possibly reflecting physiologic activity. Consider direct visualization. 4. No evidence of hypermetabolic metastatic disease in the chest, abdomen or pelvis. 5. Colonic diverticulosis without findings of acute diverticulitis. 6. Fat containing right inguinal hernia. Electronically Signed   By: Dahlia Bailiff M.D.   On: 11/13/2022 12:56   [place] CORE BIOPSY (LYMPH NODES)  Result Date: 10/21/2022 INDICATION: 57 year old male presents for biopsy of left-sided chronic cervical lymph node EXAM: ULTRASOUND-GUIDED BIOPSY OF LEFT CERVICAL LYMPH NODE ULTRASOUND-GUIDED ASPIRATION OF NECROTIC LEFT CERVICAL LYMPH NODE MEDICATIONS: None.  ANESTHESIA/SEDATION: None COMPLICATIONS: None PROCEDURE: Informed written consent was obtained from the patient after a thorough discussion of the procedural risks, benefits and alternatives. All questions were addressed. Maximal Sterile Barrier Technique was utilized including caps, mask, sterile gowns, sterile gloves, sterile drape, hand hygiene and skin antiseptic. A timeout was performed prior to the initiation of the procedure. Ultrasound survey was performed with images stored and sent to PACs. The left neck was prepped with chlorhexidine in a sterile fashion, and a sterile drape was applied covering the operative field. A sterile gown and sterile gloves were used for the procedure. Local anesthesia was provided with 1% Lidocaine. Ultrasound guidance was used to infiltrate the region with 1% lidocaine for local anesthesia. Four separate 18 gauge core needle biopsy were then acquired of the necrotic left cervical lymph node using ultrasound guidance. Images were stored. 18 gauge needle was then advanced into the node with aspiration approximately 8 cc of purulent material. Final image was stored after biopsy. Patient tolerated the procedure well and remained hemodynamically stable throughout. No complications were encountered and no significant blood loss was encounter IMPRESSION: Status post ultrasound-guided biopsy and aspiration of necrotic left cervical lymph node. Signed, Dulcy Fanny. Nadene Rubins, RPVI [facility] Radiology Electronically Signed   By: Corrie Mckusick D.O.   On: 10/21/2022 15:18   [place] FNA SOFT TISSUE  Result Date: 10/21/2022 INDICATION: 57 year old male presents for biopsy of left-sided chronic cervical lymph node EXAM: ULTRASOUND-GUIDED BIOPSY OF LEFT CERVICAL LYMPH NODE ULTRASOUND-GUIDED ASPIRATION OF NECROTIC LEFT CERVICAL LYMPH NODE MEDICATIONS:  None. ANESTHESIA/SEDATION: None COMPLICATIONS: None PROCEDURE: Informed written consent was  obtained from the patient after a thorough discussion of the procedural risks, benefits and alternatives. All questions were addressed. Maximal Sterile Barrier Technique was utilized including caps, mask, sterile gowns, sterile gloves, sterile drape, hand hygiene and skin antiseptic. A timeout was performed prior to the initiation of the procedure. Ultrasound survey was performed with images stored and sent to PACs. The left neck was prepped with chlorhexidine in a sterile fashion, and a sterile drape was applied covering the operative field. A sterile gown and sterile gloves were used for the procedure. Local anesthesia was provided with 1% Lidocaine. Ultrasound guidance was used to infiltrate the region with 1% lidocaine for local anesthesia. Four separate 18 gauge core needle biopsy were then acquired of the necrotic left cervical lymph node using ultrasound guidance. Images were stored. 18 gauge needle was then advanced into the node with aspiration approximately 8 cc of purulent material. Final image was stored after biopsy. Patient tolerated the procedure well and remained hemodynamically stable throughout. No complications were encountered and no significant blood loss was encounter IMPRESSION: Status post ultrasound-guided biopsy and aspiration of necrotic left cervical lymph node. Signed, Dulcy Fanny. Nadene Rubins, RPVI [facility] Radiology Electronically Signed   By: Corrie Mckusick D.O.   On: 10/21/2022 15:18    ASSESSMENT & PLAN  DMANI MIZER is a 57 y.o. 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 Primary site unclear at this time but is likely oropharyngeal given HPV positivity.  PLAN: -discussed PET scan results from 11/13/2022 with the patient. Showed: 1. Asymmetric thickening along the left oropharynx with intense hypermetabolic activity in the left palatine tonsil, compatible with  primary neoplasm. 2. Hypermetabolic centrally necrotic left jugulodigastric level 2 lymph node consistent with nodal disease. 3. Sublingual hypermetabolic activity in the anterior mouth, nonspecific and possibly reflecting physiologic activity. Consider direct visualization. 4. No evidence of hypermetabolic metastatic disease in the chest, abdomen or pelvis.  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  -Next steps are according with the pathology results after his surgery. Educated the patient on potential treatment options post pathology and PET/CT results. -He has been scheduled for surgery with Dr Constance Holster next Monday on 11/24/2022.  -Answered all of patient's and his sister's questions regarding PET scan results and next plan.   FOLLOW-UP: RTC with Dr Irene Limbo with labs in 3-4 weeks.  The total time spent in the appointment was 25 minutes* .  All of the patient's questions were answered with apparent satisfaction. The patient knows to call the clinic with any problems, questions or concerns.   Sullivan Lone MD MS AAHIVMS [facility] Hematology/Oncology Physician [facility]  .*Total Encounter Time as defined by the Centers for Medicare and Medicaid Services includes, in addition to the face-to-face time of a patient visit (documented in the note above) non-face-to-face time: obtaining and reviewing outside history, ordering and reviewing medications, tests or procedures, care coordination (communications with other health care professionals or caregivers) and documentation in the medical record.   I, Cleda Mccreedy, am acting as a Education administrator for Sullivan Lone, MD. .I have reviewed the above documentation for accuracy and completeness, and I agree with the above. Brunetta Genera MD

## 2022-11-18 ENCOUNTER — Encounter: Payer: Self-pay | Admitting: Radiation Oncology

## 2022-11-18 ENCOUNTER — Ambulatory Visit
Admission: RE | Admit: 2022-11-18 | Discharge: 2022-11-18 | Disposition: A | Discharge status: Home / Self Care | Payer: 59 | Source: Ambulatory Visit | Attending: Radiation Oncology | Admitting: Radiation Oncology

## 2022-11-18 ENCOUNTER — Telehealth: Payer: Self-pay

## 2022-11-18 VITALS — BP 154/98 | HR 68 | Temp 98.1°F | Resp 20 | Ht 74.0 in | Wt 247.0 lb

## 2022-11-18 DIAGNOSIS — Z79899 Other long term (current) drug therapy: Secondary | ICD-10-CM | POA: Diagnosis not present

## 2022-11-18 DIAGNOSIS — Z809 Family history of malignant neoplasm, unspecified: Secondary | ICD-10-CM | POA: Diagnosis not present

## 2022-11-18 DIAGNOSIS — C090 Malignant neoplasm of tonsillar fossa: Secondary | ICD-10-CM | POA: Insufficient documentation

## 2022-11-18 DIAGNOSIS — I119 Hypertensive heart disease without heart failure: Secondary | ICD-10-CM | POA: Diagnosis not present

## 2022-11-18 DIAGNOSIS — N400 Enlarged prostate without lower urinary tract symptoms: Secondary | ICD-10-CM | POA: Diagnosis not present

## 2022-11-18 DIAGNOSIS — C760 Malignant neoplasm of head, face and neck: Secondary | ICD-10-CM

## 2022-11-18 DIAGNOSIS — K4090 Unilateral inguinal hernia, without obstruction or gangrene, not specified as recurrent: Secondary | ICD-10-CM | POA: Insufficient documentation

## 2022-11-18 DIAGNOSIS — K5730 Diverticulosis of large intestine without perforation or abscess without bleeding: Secondary | ICD-10-CM | POA: Insufficient documentation

## 2022-11-18 DIAGNOSIS — Z803 Family history of malignant neoplasm of breast: Secondary | ICD-10-CM | POA: Diagnosis not present

## 2022-11-18 DIAGNOSIS — E7800 Pure hypercholesterolemia, unspecified: Secondary | ICD-10-CM | POA: Diagnosis not present

## 2022-11-18 NOTE — Progress Notes (Signed)
EKG reviewed by Dr. Sabra Heck, will proceed with surgery as scheduled.

## 2022-11-21 ENCOUNTER — Other Ambulatory Visit: Payer: Self-pay

## 2022-11-21 DIAGNOSIS — C090 Malignant neoplasm of tonsillar fossa: Secondary | ICD-10-CM

## 2022-11-24 ENCOUNTER — Ambulatory Visit (HOSPITAL_BASED_OUTPATIENT_CLINIC_OR_DEPARTMENT_OTHER): Admission: RE | Admit: 2022-11-24 | Payer: 59 | Source: Home / Self Care | Admitting: Otolaryngology

## 2022-11-24 DIAGNOSIS — I10 Essential (primary) hypertension: Secondary | ICD-10-CM

## 2022-11-24 DIAGNOSIS — Z01818 Encounter for other preprocedural examination: Secondary | ICD-10-CM

## 2022-11-24 SURGERY — TONSILLECTOMY
Anesthesia: General

## 2022-11-25 ENCOUNTER — Other Ambulatory Visit: Payer: Self-pay

## 2022-11-25 DIAGNOSIS — C090 Malignant neoplasm of tonsillar fossa: Secondary | ICD-10-CM

## 2022-11-28 ENCOUNTER — Telehealth: Payer: Self-pay | Admitting: Hematology

## 2022-11-28 NOTE — Telephone Encounter (Signed)
Scheduled per inbasket message, providers nurse informed me patient is already aware of 01/29 appointment.

## 2022-12-01 ENCOUNTER — Ambulatory Visit
Admission: RE | Admit: 2022-12-01 | Discharge: 2022-12-01 | Disposition: A | Discharge status: Home / Self Care | Payer: 59 | Source: Ambulatory Visit | Attending: Radiation Oncology | Admitting: Radiation Oncology

## 2022-12-01 ENCOUNTER — Telehealth: Payer: Self-pay | Admitting: *Deleted

## 2022-12-01 ENCOUNTER — Other Ambulatory Visit: Payer: Self-pay

## 2022-12-01 ENCOUNTER — Telehealth: Payer: Self-pay

## 2022-12-01 ENCOUNTER — Inpatient Hospital Stay: Payer: 59 | Admitting: Hematology

## 2022-12-01 ENCOUNTER — Ambulatory Visit
Admission: RE | Admit: 2022-12-01 | Discharge: 2022-12-01 | Disposition: A | Discharge status: Home / Self Care | Payer: 59 | Source: Ambulatory Visit | Attending: Hematology | Admitting: Hematology

## 2022-12-01 ENCOUNTER — Inpatient Hospital Stay: Payer: 59

## 2022-12-01 VITALS — BP 136/95 | HR 79 | Temp 98.8°F | Resp 20 | Wt 245.5 lb

## 2022-12-01 DIAGNOSIS — C090 Malignant neoplasm of tonsillar fossa: Secondary | ICD-10-CM | POA: Insufficient documentation

## 2022-12-01 LAB — BUN & CREATININE (CHCC)
BUN: 12 mg/dL (ref 6–20)
Creatinine: 1.05 mg/dL (ref 0.61–1.24)
GFR, Estimated: >60 mL/min (ref >60)

## 2022-12-01 LAB — TSH: TSH: 2.244 u[IU]/mL (ref 0.350–4.500)

## 2022-12-01 MED ORDER — SODIUM CHLORIDE 0.9% FLUSH
3.0000 mL | Freq: Once | INTRAVENOUS | Status: AC
  Administered 2022-12-01: 3 mL via INTRAVENOUS

## 2022-12-01 NOTE — Progress Notes (Signed)
Oncology Nurse Navigator Documentation   Met with patient during consult with Dr. Irene Limbo.  Further introduced myself as his Navigator, explained my role as a member of the Care Team. Provided New Patient resource guide binder: Contact information for physicians, this navigator, other members of the Care Team Advance Directive information; provided [facility] AD booklet at their request,  Fall Prevention Patient [place] Information sheet Symptom Management Clinic information [place] campus map with highlight of [place] [facility] Information sheet Head and Neck cancer basics Nutrition information Patient and family support information including Spiritual care/Chaplain information, Peer mentor program, health and wellness classes, and the survivorship program Community resources  Provided and discussed educational handouts for PEG and PAC. Assisted with post-consult appt scheduling.  He verbalized understanding of information provided. I encouraged him to call with questions/concerns moving forward.  Harlow Asa, RN, BSN, OCN Head & Neck Oncology Nurse [place] at [place] [phone]

## 2022-12-01 NOTE — Progress Notes (Signed)
Oncology Nurse Navigator Documentation   To provide support, encouragement and care continuity, met with Jonathan Hudson before his CT SIM. He was accompanied by his friend, Jonathan Hudson.  He tolerated procedure without difficulty, denied questions/concerns.   We discussed upcoming treatment including scheduling for his PAC and PEG. He knows that he will receive a phone call to schedule a pre chemotherapy audiology exam.  I encouraged him to call me prior to 12/08/22 [place].   Harlow Asa RN, BSN, OCN Head & Neck Oncology Nurse [place] at [facility] Phone # [phone]  Fax # [phone]

## 2022-12-01 NOTE — Progress Notes (Signed)
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 male with medical history significant for hypertension and hypercholesteremia presents to the diagnostic clinic for evaluation for left neck lymphadenopathy.  On review of the previous records, Jonathan Hudson presented to the urgent care on 09/04/2022 with a large painful left neck mass. It was initially felt to be infectious so treated with doxycycline with little improvement. He followed by with his PCP on 09/22/2022 who ordered a ultrasound on 10/01/2022 which revealed a hypoechoic solid mass in the left neck measuring 4.7 x 2.7 x 4.2 cm. CT neck followed on 10/06/2022 which showed bulky and necrotic adenopathy in the left level 2 neck.   On exam today, Jonathan Hudson reports that his energy levels are fairly stable. He is able to complete all his ADLs on his own. He is currently on FMLA while he undergoes workup. He reports having a good appetite and denies any weight loss. He denies nausea, vomiting or abdominal pain. His bowel habits are unchanged without any recurrent episodes of diarrhea or constipation. He denies easy bruising or signs of active bleeding. He reports having intermittent episodes of positional dizziness without any syncopal episodes.He denies fevers, chills, night sweats, shortness of breath, chest pain, cough, peripheral edema, neuropathy, pruritus or rash. He has no other complaints. Rest of the 10 point ROS is below.   INTERVAL HISTORY  Jonathan Hudson 57 y.o. male is connected via phone for continued evaluation and management of his newly diagnosed  metastatic head and neck squamous cell carcinoma.  I had a phone visit with the patient on 11/17/2022 and he was doing well overall. During the visit, he reported that his tonsillectomy was scheduled for 11/24/2022.   Patient reports he has been doing well overall without any new medical concerns since our last visit. He denies previous history of kidney problems or hearing problems. He complains of itchiness throughout his body, especially on his back. He has been applying benadryl cream, but it has not been helping.    He notes he had hearing test at work recently which did not show any abnormalities.  Pt denies any family history of any autoimmune diseases, bust has family history of prostate cancer, liver cancer, and breast cancer.   Patient denies fever, chills, night sweats, unexpected weight loss, infection issues, back pain, chest pain, or leg swelling.    MEDICAL HISTORY:  Past Medical History:  Diagnosis Date   Boils 01/01/2017   Hypercholesteremia    Hypertension     SURGICAL HISTORY: Past Surgical History:  Procedure Laterality Date   COLONOSCOPY N/A 01/29/2017   Procedure: COLONOSCOPY;  Surgeon: Rogene Houston, MD;  [facility];  Service: Endoscopy;  Laterality: N/A;  730   COLONOSCOPY WITH PROPOFOL N/A 04/03/2022   Procedure: COLONOSCOPY WITH PROPOFOL;  Surgeon: Rogene Houston, MD;  [facility];  Service: Endoscopy;  Laterality: N/A;  730   cyst removed from left wrist      EYE SURGERY     grafts N/A 10  years  ago   grafts to gums   HEMOSTASIS CLIP PLACEMENT  04/03/2022   Procedure: HEMOSTASIS CLIP PLACEMENT;  Surgeon: Rogene Houston, MD;  [facility];  Service: Endoscopy;;   OPEN REDUCTION INTERNAL FIXATION (ORIF) DISTAL RADIAL FRACTURE Right 12/28/2013   Procedure: OPEN REDUCTION INTERNAL FIXATION (ORIF) DISTAL RADIAL FRACTURE;  Surgeon: Carole Civil, MD;  Location: [facility];  Service: Orthopedics;  Laterality: Right;    ORIF ULNAR FRACTURE Right 12/28/2013   Procedure: OPEN REDUCTION INTERNAL FIXATION (ORIF) ULNAR FRACTURE;  Surgeon: Carole Civil, MD;  Location: [facility];  Service: Orthopedics;  Laterality: Right;   POLYPECTOMY  04/03/2022   Procedure: POLYPECTOMY;  Surgeon: Rogene Houston, MD;  [facility];  Service: Endoscopy;;   right knee Right teenager   right knee     SOCIAL HISTORY: Social History   Socioeconomic History   Marital status: Divorced    Spouse name: Not on file   Number of children: Not on file   Years of education: Not on file   Highest education level: Not on file  Occupational History   Not on file  Tobacco Use   Smoking status: Never   Smokeless tobacco: Never  Vaping Use   Vaping Use: Never used  Substance and Sexual Activity   Alcohol use: Yes    Comment: Occ  beer or wine    Drug use: No   Sexual activity: Not on file  Other Topics Concern   Not on file  Social History Narrative   Not on file   Social Determinants of Health   Financial Resource Strain: Not on file  Food Insecurity: Not on file  Transportation Needs: Not on file  Physical Activity: Not on file  Stress: Not on file  Social Connections: Not on file  Intimate Partner Violence: Not on file    FAMILY HISTORY: Family History  Problem Relation Age of Onset   Cancer Mother        small bowel spread to liver   Pancreatic cancer Father    Cancer Maternal Aunt    Brain cancer Maternal Uncle    Cancer Paternal Aunt    Breast cancer Cousin    Cancer Paternal Uncle     ALLERGIES:  has No Known Allergies.  MEDICATIONS:  Current Outpatient Medications  Medication Sig Dispense Refill   amLODipine (NORVASC) 10 MG tablet Take 10 mg by mouth daily.     atorvastatin (LIPITOR) 40 MG tablet Take 40 mg by mouth daily.     doxycycline (VIBRAMYCIN) 100 MG capsule Take 1 capsule (100 mg total) by mouth 2 (two) times daily. 20 capsule 0   lansoprazole (PREVACID) 15 MG capsule Take 15  mg by mouth every morning.     Multiple Vitamin (MULTIVITAMIN WITH MINERALS) TABS tablet Take 1 tablet by mouth daily.     olmesartan (BENICAR) 40 MG tablet Take 40 mg by mouth daily.     sildenafil (VIAGRA) 100 MG tablet Take 100 mg by mouth daily as needed for erectile dysfunction. (Patient not taking: Reported on 11/18/2022)  5   No current facility-administered medications for this visit.    REVIEW OF SYSTEMS:   10 Point review of Systems was done is negative except as noted above.  PHYSICAL EXAMINATION:telemedicine visit  LABORATORY DATA:  I have reviewed the data as listed .    Latest Ref Rng & Units 10/30/2022   11:41 AM 10/13/2022   10:06 AM 12/28/2013    1:52 AM  CBC  WBC 4.0 - 10.5 K/uL 11.4  9.5  15.0   Hemoglobin 13.0 - 17.0  g/dL 15.5  15.5  15.0   Hematocrit 39.0 - 52.0 % 45.1  45.6  43.2   Platelets 150 - 400 K/uL 276  268  238    .    Latest Ref Rng & Units 10/30/2022   11:41 AM 10/13/2022   10:06 AM 12/28/2013    1:52 AM  CMP  Glucose 70 - 99 mg/dL 117  107  146   BUN 6 - 20 mg/dL '15  12  17   '$ Creatinine 0.61 - 1.24 mg/dL 0.94  0.98  1.14   Sodium 135 - 145 mmol/L 140  141  141   Potassium 3.5 - 5.1 mmol/L 4.3  4.4  3.5   Chloride 98 - 111 mmol/L 105  107  103   CO2 22 - 32 mmol/L '31  30  24   '$ Calcium 8.9 - 10.3 mg/dL 9.4  9.7  8.9   Total Protein 6.5 - 8.1 g/dL 7.5  6.9    Total Bilirubin 0.3 - 1.2 mg/dL 0.4  0.5    Alkaline Phos 38 - 126 U/L 74  79    AST 15 - 41 U/L 35  20    ALT 0 - 44 U/L 49  20        SURGICAL PATHOLOGY  CASE: [phone]  PATIENT: Jonathan Hudson  Surgical Pathology Report      Specimen Submitted:  A. Lymph node, left neck   Clinical History: Necrotic node CT, possible squamous cell.  No history  of CA       DIAGNOSIS:  A. LYMPH NODE, LEFT NECK; ULTRASOUND-GUIDED BIOPSY:  - METASTATIC SQUAMOUS CELL CARCINOMA, P16 POSITIVE.  - NECROTIC DEBRIS.   Comment:  The carcinoma is positive for p63 and p16. This  pattern of staining  supports the above diagnosis.  There is insufficient material for ancillary molecular testing.   . Lab Results  Component Value Date   LDH 198 (H) 10/13/2022   Component     Latest Ref Rng 10/13/2022  HIV Screen 4th Generation wRfx     Non Reactive  Non Reactive   HCV Ab     NON REACTIVE  NON REACTIVE   Hepatitis B Surface Ag     NON REACTIVE  NON REACTIVE   Hep B S Ab     NON REACTIVE  NON REACTIVE   Hep B Core Total Ab     NON REACTIVE  NON REACTIVE   CRP     <1.0 mg/dL <0.5   Sed Rate     0 - 16 mm/hr 7   LDH     98 - 192 U/L 198 (H)     Legend: (H) High RADIOGRAPHIC STUDIES: I have personally reviewed the radiological images as listed and agreed with the findings in the report. NM PET Image Initial (PI) Skull Base To Thigh  Result Date: 11/13/2022 CLINICAL DATA:  Initial treatment strategy for squamous cell carcinoma of the neck. EXAM: NUCLEAR MEDICINE PET SKULL BASE TO THIGH TECHNIQUE: 12.9 mCi F-18 FDG was injected intravenously. Full-ring PET imaging was performed from the skull base to thigh after the radiotracer. CT data was obtained and used for attenuation correction and anatomic localization. Fasting blood glucose: 110 mg/dl COMPARISON:  Neck CT October 06, 2022 FINDINGS: Mediastinal blood pool activity: SUV max 1.8 Liver activity: SUV max NA NECK: Hypermetabolic centrally necrotic left jugulodigastric lymph node measures 3.8 x 3.0 cm on image 79/3 with a max SUV of 10.8. Asymmetric thickening along the  left oropharynx with intense hypermetabolic activity in the left palatine tonsil max SUV of 15.8. Sublingual hypermetabolic activity in the anterior mouth with a max SUV of 8.2. Incidental CT findings: Streak artifact from dental hardware. CHEST: Hypermetabolic thoracic adenopathy. No hypermetabolic pulmonary nodules or masses. Incidental CT findings: Borderline cardiac enlargement. Patulous esophagus. ABDOMEN/PELVIS: No abnormal hypermetabolic  activity within the liver, pancreas, adrenal glands, or spleen. No hypermetabolic lymph nodes in the abdomen or pelvis. Incidental CT findings: Bilobar hepatic hypodensities measuring fluid density compatible with cysts with additional hypodensities technically too small to accurately characterize but statistically likely to reflect cysts or hemangiomas. Colonic diverticulosis without findings of acute diverticulitis. Enlarged prostate gland. Fat containing right inguinal hernia. SKELETON: No focal hypermetabolic activity to suggest skeletal metastasis. Incidental CT findings: Plate and screw fixation hardware in the right forearm. IMPRESSION: 1. Asymmetric thickening along the left oropharynx with intense hypermetabolic activity in the left palatine tonsil, compatible with primary neoplasm. 2. Hypermetabolic centrally necrotic left jugulodigastric level 2 lymph node consistent with nodal disease. 3. Sublingual hypermetabolic activity in the anterior mouth, nonspecific and possibly reflecting physiologic activity. Consider direct visualization. 4. No evidence of hypermetabolic metastatic disease in the chest, abdomen or pelvis. 5. Colonic diverticulosis without findings of acute diverticulitis. 6. Fat containing right inguinal hernia. Electronically Signed   By: Dahlia Bailiff M.D.   On: 11/13/2022 12:56    ASSESSMENT & PLAN  Jonathan Hudson is a 57 y.o. 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 Primary site unclear at this time but is likely oropharyngeal given HPV positivity.  PLAN: -Discussed the tumor board recommendation of chemotherapy and radiation instead of surgery.  -Educated the patient on combination treatment of chemotherapy and radiation therapy in details with the patient. Patient already has had discussion with radiation oncologist about radiation treatment.  -Educated the patient on chemotherapy, cisplatin, and its risks and  benefits.  -Patient agrees for port a cath and G-tube.  -Recommended to stay hydrated and eat well.  -Answered all of patient's questions regarding his treatment and other general questions.  -Will schedule patient for audiograph.  -Schedule patient for Chemo-counseling for Cisplatin/RT -Schedule to start Cisplatin with RT initiation on 2/5  . Orders Placed This Encounter  Procedures   IR IMAGING GUIDED PORT INSERTION    Standing Status:   Future    Standing Expiration Date:   12/02/2023    Order Specific Question:   Reason for Exam (SYMPTOM  OR DIAGNOSIS REQUIRED)    Answer:   port a cath placement for chemotherapy for head and neck cancer    Order Specific Question:   Preferred Imaging Location?    Answer:   [facility]   IR Gastrostomy Tube    Standing Status:   Future    Standing Expiration Date:   12/02/2023    Order Specific Question:   Reason for exam:    Answer:   G-tube placement for patient with head and neck cancer about to start concurrent chemo/radiation for newly diagnosed tonsillar SCC    Order Specific Question:   Preferred Imaging Location?    Answer:   [facility]   CBC with Differential ([place] Only)    Standing Status:   Future    Standing Expiration Date:   02/07/5680   Basic Metabolic Panel - [place] Only    Standing Status:   Future    Standing Expiration Date:   12/09/2023  Magnesium    Standing Status:   Future    Standing Expiration Date:   12/09/2023   CBC with Differential ([facility] Only)    Standing Status:   Future    Standing Expiration Date:   3/81/8299   Basic Metabolic Panel - [facility] Only    Standing Status:   Future    Standing Expiration Date:   12/16/2023   Magnesium    Standing Status:   Future    Standing Expiration Date:   12/16/2023    FOLLOW-UP: Audiogram with Dr Janeice Robinson office Port a cath- ASAP G-tube by IR ASAP Chemo-counseling for Cisplatin/RT Schedule to start Cisplatin with RT initiation  on 2/5 Portflush labs weekly starting with 1st treatment x 6 weeks MD visit 1 week after C1D1 of Cisplatin for toxicity check (instead of current appointment on 2/7)  The total time spent in the appointment was 32 minutes* .  All of the patient's questions were answered with apparent satisfaction. The patient knows to call the clinic with any problems, questions or concerns.   Sullivan Lone MD MS AAHIVMS [facility] Hematology/Oncology Physician [facility]  .*Total Encounter Time as defined by the Centers for Medicare and Medicaid Services includes, in addition to the face-to-face time of a patient visit (documented in the note above) non-face-to-face time: obtaining and reviewing outside history, ordering and reviewing medications, tests or procedures, care coordination (communications with other health care professionals or caregivers) and documentation in the medical record.   I, Cleda Mccreedy, am acting as a Education administrator for Sullivan Lone, MD.  .I have reviewed the above documentation for accuracy and completeness, and I agree with the above. Brunetta Genera MD

## 2022-12-01 NOTE — Telephone Encounter (Signed)
Connected with Gilford Raid regarding [facility] Critical Illness request for policy certificate information an signatures.  Also signed the [place].  "They should be able to look up the policy and certificate information.  I am 35 miles away, 70 miles round trip travel here.  Worked last Friday, 11/28/2022 so today is the first day missed out of work.       Form completed to collaborative bin for provider review, signature and return.

## 2022-12-01 NOTE — Progress Notes (Incomplete)
Has armband been applied?  Yes.    Does patient have an allergy to IV contrast dye?: No.   Has patient ever received premedication for IV contrast dye?: No.   Does patient take metformin?: No.  If patient does take metformin when was the last dose: not applicable  Date of lab work: 12-01-22 BUN: 12 CR: 1.05 eGfr: greater than 60  IV site: forearm right, condition patent and no redness  Has IV site been added to flowsheet?  Yes.    There were no vitals taken for this visit.

## 2022-12-01 NOTE — Telephone Encounter (Signed)
CSW attempted to contact patient to assess psychosocial needs.  Left vm.

## 2022-12-02 ENCOUNTER — Other Ambulatory Visit: Payer: 59

## 2022-12-02 ENCOUNTER — Encounter: Payer: Self-pay | Admitting: *Deleted

## 2022-12-02 ENCOUNTER — Inpatient Hospital Stay: Payer: 59

## 2022-12-02 ENCOUNTER — Encounter: Payer: Self-pay | Admitting: Hematology

## 2022-12-02 MED ORDER — DEXAMETHASONE 4 MG PO TABS: ORAL_TABLET | ORAL | 1 refills | Status: DC

## 2022-12-02 MED ORDER — LIDOCAINE-PRILOCAINE 2.5-2.5 % EX CREA: TOPICAL_CREAM | CUTANEOUS | 3 refills | Status: AC

## 2022-12-02 MED ORDER — PROCHLORPERAZINE MALEATE 10 MG PO TABS: 10.0000 mg | ORAL_TABLET | Freq: Four times a day (QID) | ORAL | 1 refills | Status: DC | PRN

## 2022-12-02 MED ORDER — ONDANSETRON HCL 8 MG PO TABS: 8.0000 mg | ORAL_TABLET | Freq: Three times a day (TID) | ORAL | 1 refills | Status: DC | PRN

## 2022-12-02 NOTE — Progress Notes (Signed)
START ON PATHWAY REGIMEN - Head and Neck     A cycle is every 7 days:     Cisplatin   **Always confirm dose/schedule in your pharmacy ordering system**  Patient Characteristics: Oropharynx, HPV Positive, Preoperative or Nonsurgical Candidate (Clinical Staging), cT0-4, cN1-3 or cT3-4, cN0 Disease Classification: Oropharynx HPV Status: Positive (+) Therapeutic Status: Preoperative or Nonsurgical Candidate (Clinical Staging) AJCC T Category: cT2 AJCC 8 Stage Grouping: II AJCC N Category: cN2 AJCC M Category: cM0 Intent of Therapy: Curative Intent, Discussed with Patient

## 2022-12-02 NOTE — Telephone Encounter (Signed)
Today this nurse received signed [place] form signed by provider.  Successfully returned with 10/21/2022 pathology via fax [phone]).  Copy to bin designated for items to be scanned and [facility] file folder for patient pick-up.  No further directions received or actions performed by this nurse.

## 2022-12-02 NOTE — Progress Notes (Signed)
Patient called after receiving my card per my request.  Introduced myself as Arboriculturist and to offer available resources. Discussed one-time $1000 Radio broadcast assistant to assist with personal expenses while going through treatment. Based on verbal income guidelines, he states exceeds the allowable amount.   Advised should any copay programs be available for his diagnosis or specific treatment drugs, a different team([facility]) would reach out to him to discuss. He verbalized understanding.  He has my card for any additional financial questions or concerns.

## 2022-12-03 ENCOUNTER — Other Ambulatory Visit: Payer: Self-pay

## 2022-12-03 ENCOUNTER — Inpatient Hospital Stay: Payer: 59 | Admitting: Licensed Clinical Social Worker

## 2022-12-03 NOTE — Progress Notes (Signed)
[place] Work  Clinical Social Work was referred by medical provider for assessment of psychosocial needs.  Clinical Social Worker contacted patient by phone  to offer support and assess for needs.    Conversation was brief per patient's request.  His short term disability has started and he said he had another policy to help him financially.  He said he is positive and feels good.  His expressed adjusting to his illness and is looking forward to getting started with his treatment.  He expressed understanding of the upcoming surgeries and treatment plan.  He said he has very good support and has reached out to his [place].  He agreed to have CSW mail him information about the [employer] and CSW contact information.  Provided active listening and supportive counseling.  Patient expressed no other needs.   Margaree Mackintosh, LCSW  Clinical Social Worker [facility]

## 2022-12-05 ENCOUNTER — Other Ambulatory Visit: Payer: Self-pay

## 2022-12-07 ENCOUNTER — Encounter: Payer: Self-pay | Admitting: Hematology

## 2022-12-08 ENCOUNTER — Ambulatory Visit
Admission: RE | Admit: 2022-12-08 | Discharge: 2022-12-08 | Disposition: A | Discharge status: Home / Self Care | Payer: 59 | Source: Ambulatory Visit | Attending: Radiation Oncology | Admitting: Radiation Oncology

## 2022-12-08 ENCOUNTER — Other Ambulatory Visit: Payer: Self-pay | Admitting: Radiation Oncology

## 2022-12-08 ENCOUNTER — Other Ambulatory Visit: Payer: Self-pay

## 2022-12-08 DIAGNOSIS — Z5111 Encounter for antineoplastic chemotherapy: Secondary | ICD-10-CM | POA: Diagnosis present

## 2022-12-08 DIAGNOSIS — Z87891 Personal history of nicotine dependence: Secondary | ICD-10-CM | POA: Insufficient documentation

## 2022-12-08 DIAGNOSIS — E7800 Pure hypercholesterolemia, unspecified: Secondary | ICD-10-CM | POA: Insufficient documentation

## 2022-12-08 DIAGNOSIS — Z79899 Other long term (current) drug therapy: Secondary | ICD-10-CM | POA: Diagnosis not present

## 2022-12-08 DIAGNOSIS — C770 Secondary and unspecified malignant neoplasm of lymph nodes of head, face and neck: Secondary | ICD-10-CM | POA: Insufficient documentation

## 2022-12-08 DIAGNOSIS — I10 Essential (primary) hypertension: Secondary | ICD-10-CM | POA: Diagnosis not present

## 2022-12-08 DIAGNOSIS — C090 Malignant neoplasm of tonsillar fossa: Secondary | ICD-10-CM

## 2022-12-08 DIAGNOSIS — C760 Malignant neoplasm of head, face and neck: Secondary | ICD-10-CM

## 2022-12-08 LAB — RAD ONC ARIA SESSION SUMMARY
Course Elapsed Days: 0
Plan Fractions Treated to Date: 1
Plan Prescribed Dose Per Fraction: 2 Gy
Plan Total Fractions Prescribed: 35
Plan Total Prescribed Dose: 70 Gy
Reference Point Dosage Given to Date: 2 Gy
Reference Point Session Dosage Given: 2 Gy
Session Number: 1

## 2022-12-08 MED ORDER — SONAFINE EX EMUL
1.0000 | Freq: Once | CUTANEOUS | Status: AC
  Administered 2022-12-08: 1 via TOPICAL

## 2022-12-08 MED ORDER — LIDOCAINE VISCOUS HCL 2 % MT SOLN: OROMUCOSAL | 3 refills | Status: DC

## 2022-12-08 NOTE — Progress Notes (Signed)
Oncology Nurse Navigator Documentation   To provide support, encouragement and care continuity, met with Jonathan Hudson for his initial RT.  He was accompanied by his significant other, Jonathan Hudson. I reviewed the 2-step treatment process, answered questions.  Jonathan Hudson completed treatment without difficulty, denied questions/concerns. I showed them the location for his nutrition appointment tomorrow.  I reviewed the registration/arrival procedure for subsequent treatments. I encouraged them to call me with questions/concerns as treatments proceed.   Harlow Asa RN, BSN, OCN Head & Neck Oncology Nurse [place] at [facility] Phone # [phone]  Fax # [phone]

## 2022-12-09 ENCOUNTER — Inpatient Hospital Stay: Payer: 59 | Attending: Physician Assistant | Admitting: Nutrition

## 2022-12-09 ENCOUNTER — Ambulatory Visit
Admission: RE | Admit: 2022-12-09 | Discharge: 2022-12-09 | Disposition: A | Discharge status: Home / Self Care | Payer: 59 | Source: Ambulatory Visit | Attending: Radiation Oncology | Admitting: Radiation Oncology

## 2022-12-09 ENCOUNTER — Other Ambulatory Visit: Payer: Self-pay

## 2022-12-09 DIAGNOSIS — E7800 Pure hypercholesterolemia, unspecified: Secondary | ICD-10-CM | POA: Insufficient documentation

## 2022-12-09 DIAGNOSIS — Z79899 Other long term (current) drug therapy: Secondary | ICD-10-CM | POA: Insufficient documentation

## 2022-12-09 DIAGNOSIS — Z5111 Encounter for antineoplastic chemotherapy: Secondary | ICD-10-CM | POA: Diagnosis not present

## 2022-12-09 DIAGNOSIS — C090 Malignant neoplasm of tonsillar fossa: Secondary | ICD-10-CM | POA: Insufficient documentation

## 2022-12-09 DIAGNOSIS — Z87891 Personal history of nicotine dependence: Secondary | ICD-10-CM | POA: Insufficient documentation

## 2022-12-09 DIAGNOSIS — I10 Essential (primary) hypertension: Secondary | ICD-10-CM | POA: Insufficient documentation

## 2022-12-09 DIAGNOSIS — C770 Secondary and unspecified malignant neoplasm of lymph nodes of head, face and neck: Secondary | ICD-10-CM | POA: Insufficient documentation

## 2022-12-09 LAB — RAD ONC ARIA SESSION SUMMARY
Course Elapsed Days: 1
Plan Fractions Treated to Date: 2
Plan Prescribed Dose Per Fraction: 2 Gy
Plan Total Fractions Prescribed: 35
Plan Total Prescribed Dose: 70 Gy
Reference Point Dosage Given to Date: 4 Gy
Reference Point Session Dosage Given: 2 Gy
Session Number: 2

## 2022-12-09 MED FILL — Dexamethasone Sodium Phosphate Inj 100 MG/10ML: INTRAMUSCULAR | Qty: 1 | Status: AC

## 2022-12-09 MED FILL — Fosaprepitant Dimeglumine For IV Infusion 150 MG (Base Eq): INTRAVENOUS | Qty: 5 | Status: AC

## 2022-12-09 NOTE — Progress Notes (Signed)
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  He is followed by Dr. Irene Limbo and Dr. Isidore Moos and will receive concurrent chemoradiation therapy.  G-tube is scheduled for Monday, February 12.  Past medical history includes tobacco, hypercholesterolemia, hypertension.  Medications include Lipitor, Decadron, multivitamin, Zofran, and Compazine.  Labs reviewed.  Noted glucose 117 on December 28.  Height: 6 feet 2 inches. Weight: 245 pounds 8 ounces on January 29. Usual body weight: 235-240 pounds per patient. BMI: 31.52.  Patient reports he has been trying to eat more to gain weight.  He denies current nutrition impact symptoms.  Reports he is single and lives alone.  States family members have provided him with multiple cartons of Ensure and boost.  He wants to get outside to walk.  Patient has many questions as to why he was diagnosed with cancer.  He wonders if there is a genetic component.  Nutrition diagnosis: Food and nutrition related knowledge deficit related to cancer as evidenced by no prior need for nutrition related information.  Intervention: Educated on importance of small, frequent meals and snacks with adequate calories and protein to minimize weight loss. Reviewed foods high in protein. Encourage patient to begin oral nutrition supplements when oral intake is decreased or weight loss occurs. Encouraged patient that RD will follow weekly to help him navigate nutrition impact symptoms. Encouraged him to discuss his diagnosis with his doctor. Provided multiple fact sheets.  Questions answered.  Teach back method used.  Contact information given.  Monitoring, evaluation, goals: Patient will tolerate adequate calories and protein to minimize weight loss.  Next visit: Wednesday, February 14 during infusion with Vinnie Level.  **Disclaimer: This note was dictated with voice recognition software. Similar sounding words can  inadvertently be transcribed and this note may contain transcription errors which may not have been corrected upon publication of note.**

## 2022-12-10 ENCOUNTER — Inpatient Hospital Stay: Payer: 59

## 2022-12-10 ENCOUNTER — Other Ambulatory Visit: Payer: Self-pay

## 2022-12-10 ENCOUNTER — Other Ambulatory Visit: Payer: 59

## 2022-12-10 ENCOUNTER — Ambulatory Visit: Payer: 59 | Admitting: Hematology

## 2022-12-10 ENCOUNTER — Ambulatory Visit
Admission: RE | Admit: 2022-12-10 | Discharge: 2022-12-10 | Disposition: A | Discharge status: Home / Self Care | Payer: 59 | Source: Ambulatory Visit | Attending: Radiation Oncology | Admitting: Radiation Oncology

## 2022-12-10 VITALS — BP 149/77 | HR 64 | Temp 97.9°F

## 2022-12-10 DIAGNOSIS — C090 Malignant neoplasm of tonsillar fossa: Secondary | ICD-10-CM

## 2022-12-10 DIAGNOSIS — Z5111 Encounter for antineoplastic chemotherapy: Secondary | ICD-10-CM | POA: Diagnosis not present

## 2022-12-10 LAB — CBC WITH DIFFERENTIAL (CANCER CENTER ONLY)
Abs Immature Granulocytes: 0.02 10*3/uL (ref 0.00–0.07)
Basophils Absolute: 0.1 10*3/uL (ref 0.0–0.1)
Basophils Relative: 1 %
Eosinophils Absolute: 0.4 10*3/uL (ref 0.0–0.5)
Eosinophils Relative: 4 %
HCT: 43 % (ref 39.0–52.0)
Hemoglobin: 14.8 g/dL (ref 13.0–17.0)
Immature Granulocytes: 0 %
Lymphocytes Relative: 43 %
Lymphs Abs: 3.9 10*3/uL (ref 0.7–4.0)
MCH: 30.8 pg (ref 26.0–34.0)
MCHC: 34.4 g/dL (ref 30.0–36.0)
MCV: 89.6 fL (ref 80.0–100.0)
Monocytes Absolute: 0.8 10*3/uL (ref 0.1–1.0)
Monocytes Relative: 9 %
Neutro Abs: 3.8 10*3/uL (ref 1.7–7.7)
Neutrophils Relative %: 43 %
Platelet Count: 287 10*3/uL (ref 150–400)
RBC: 4.8 MIL/uL (ref 4.22–5.81)
RDW: 13.2 % (ref 11.5–15.5)
WBC Count: 8.9 10*3/uL (ref 4.0–10.5)
nRBC: 0 % (ref 0.0–0.2)

## 2022-12-10 LAB — RAD ONC ARIA SESSION SUMMARY
Course Elapsed Days: 2
Plan Fractions Treated to Date: 3
Plan Prescribed Dose Per Fraction: 2 Gy
Plan Total Fractions Prescribed: 35
Plan Total Prescribed Dose: 70 Gy
Reference Point Dosage Given to Date: 6 Gy
Reference Point Session Dosage Given: 2 Gy
Session Number: 3

## 2022-12-10 LAB — BASIC METABOLIC PANEL - CANCER CENTER ONLY
Anion gap: 4 — ABNORMAL LOW (ref 5–15)
BUN: 11 mg/dL (ref 6–20)
CO2: 29 mmol/L (ref 22–32)
Calcium: 9.3 mg/dL (ref 8.9–10.3)
Chloride: 107 mmol/L (ref 98–111)
Creatinine: 0.9 mg/dL (ref 0.61–1.24)
GFR, Estimated: >60 mL/min (ref >60)
Glucose, Bld: 95 mg/dL (ref 70–99)
Potassium: 4.1 mmol/L (ref 3.5–5.1)
Sodium: 140 mmol/L (ref 135–145)

## 2022-12-10 LAB — MAGNESIUM: Magnesium: 1.8 mg/dL (ref 1.7–2.4)

## 2022-12-10 MED ORDER — SODIUM CHLORIDE 0.9 % IV SOLN
10.0000 mg | Freq: Once | INTRAVENOUS | Status: AC
  Administered 2022-12-10: 10 mg via INTRAVENOUS
  Filled 2022-12-10: qty 10

## 2022-12-10 MED ORDER — MAGNESIUM SULFATE 2 GM/50ML IV SOLN
2.0000 g | Freq: Once | INTRAVENOUS | Status: AC
  Administered 2022-12-10: 2 g via INTRAVENOUS
  Filled 2022-12-10: qty 50

## 2022-12-10 MED ORDER — PALONOSETRON HCL INJECTION 0.25 MG/5ML
0.2500 mg | Freq: Once | INTRAVENOUS | Status: AC
  Administered 2022-12-10: 0.25 mg via INTRAVENOUS
  Filled 2022-12-10: qty 5

## 2022-12-10 MED ORDER — SODIUM CHLORIDE 0.9 % IV SOLN
40.0000 mg/m2 | Freq: Once | INTRAVENOUS | Status: AC
  Administered 2022-12-10: 100 mg via INTRAVENOUS
  Filled 2022-12-10: qty 100

## 2022-12-10 MED ORDER — SODIUM CHLORIDE 0.9 % IV SOLN: Freq: Once | INTRAVENOUS | Status: AC

## 2022-12-10 MED ORDER — POTASSIUM CHLORIDE IN NACL 20-0.9 MEQ/L-% IV SOLN
Freq: Once | INTRAVENOUS | Status: AC
  Filled 2022-12-10: qty 1000

## 2022-12-10 MED ORDER — SODIUM CHLORIDE 0.9 % IV SOLN
150.0000 mg | Freq: Once | INTRAVENOUS | Status: AC
  Administered 2022-12-10: 150 mg via INTRAVENOUS
  Filled 2022-12-10: qty 150

## 2022-12-10 NOTE — Progress Notes (Signed)
Oncology Nurse Navigator Documentation   Met with Jonathan Hudson and his significant other, [name] to provide PEG education prior to 12/15/22  placement.  Using  PEG teaching device   and Teach Back, provided education for PEG use and care, including: hand hygiene, gravity bolus administration of daily water flushes and nutritional supplement, fluids and medications; care of tube insertion site including daily dressing change and cleaning; S&S of infection.   [name] correctly verbalized procedures for and provided correct return demonstration of gravity administration of water, dressing change and site care.  I provided written instructions for PEG flushing/dressing change in support of verbal instruction.   I provided/described contents of Start of Care Bolus Feeding Kit (2 60 cc syringes, 1 box 4x4 drainage sponges, 1 package mesh briefs, 1 roll paper tape, 1 case Osmolite 1.5).  He voiced understanding he is to start using Osmolite per guidance of Nutrition. He understands I will be available for ongoing PEG support. Provided barium sulfate prep which I obtained from [facility] IR and reviewed instructions.    Harlow Asa RN, BSN, OCN Head & Neck Oncology Nurse [place] at [facility] Phone # [phone]  Fax # [phone]

## 2022-12-10 NOTE — Patient Instructions (Signed)
[facility]  Discharge Instructions: Thank you for choosing [facility] to provide your oncology and hematology care.   If you have a lab appointment with the Cancer Center, please go directly to the Cancer Center and check in at the registration area.   Wear comfortable clothing and clothing appropriate for easy access to any Portacath or PICC line.   We strive to give you quality time with your provider. You may need to reschedule your appointment if you arrive late (15 or more minutes).  Arriving late affects you and other patients whose appointments are after yours.  Also, if you miss three or more appointments without notifying the office, you may be dismissed from the clinic at the provider's discretion.      For prescription refill requests, have your pharmacy contact our office and allow 72 hours for refills to be completed.    Today you received the following chemotherapy and/or immunotherapy agents Cisplatin      To help prevent nausea and vomiting after your treatment, we encourage you to take your nausea medication as directed.  BELOW ARE SYMPTOMS THAT SHOULD BE REPORTED IMMEDIATELY: *FEVER GREATER THAN 100.4 F (38 C) OR HIGHER *CHILLS OR SWEATING *NAUSEA AND VOMITING THAT IS NOT CONTROLLED WITH YOUR NAUSEA MEDICATION *UNUSUAL SHORTNESS OF BREATH *UNUSUAL BRUISING OR BLEEDING *URINARY PROBLEMS (pain or burning when urinating, or frequent urination) *BOWEL PROBLEMS (unusual diarrhea, constipation, pain near the anus) TENDERNESS IN MOUTH AND THROAT WITH OR WITHOUT PRESENCE OF ULCERS (sore throat, sores in mouth, or a toothache) UNUSUAL RASH, SWELLING OR PAIN  UNUSUAL VAGINAL DISCHARGE OR ITCHING   Items with * indicate a potential emergency and should be followed up as soon as possible or go to the Emergency Department if any problems should occur.  Please show the CHEMOTHERAPY ALERT CARD or IMMUNOTHERAPY ALERT CARD at  check-in to the Emergency Department and triage nurse.  Should you have questions after your visit or need to cancel or reschedule your appointment, please contact [facility]  Dept: [phone]  and follow the prompts.  Office hours are 8:00 a.m. to 4:30 p.m. Monday - Friday. Please note that voicemails left after 4:00 p.m. may not be returned until the following business day.  We are closed weekends and major holidays. You have access to a nurse at all times for urgent questions. Please call the main number to the clinic Dept: [phone] and follow the prompts.   For any non-urgent questions, you may also contact your provider using MyChart. We now offer e-Visits for anyone 18 and older to request care online for non-urgent symptoms. For details visit [email or web address].   Also download the MyChart app! Go to the app store, search "MyChart", open the app, select [place], and log in with your MyChart username and password.   Cisplatin Injection What is this medication? CISPLATIN (SIS pla tin) treats some types of cancer. It works by slowing down the growth of cancer cells. This medicine may be used for other purposes; ask your health care provider or pharmacist if you have questions. COMMON BRAND NAME(S): Platinol, Platinol -AQ What should I tell my care team before I take this medication? They need to know if you have any of these conditions: Eye disease, vision problems Hearing problems Kidney disease Low blood counts, such as low white cells, platelets, or red blood cells Tingling of the fingers or toes, or other nerve disorder An   unusual or allergic reaction to cisplatin, carboplatin, oxaliplatin, other medications, foods, dyes, or preservatives If you or your partner are pregnant or trying to get pregnant Breast-feeding How should I use this medication? This medication is injected into a vein. It is given by your care team in a hospital  or clinic setting. Talk to your care team about the use of this medication in children. Special care may be needed. Overdosage: If you think you have taken too much of this medicine contact a poison control center or emergency room at once. NOTE: This medicine is only for you. Do not share this medicine with others. What if I miss a dose? Keep appointments for follow-up doses. It is important not to miss your dose. Call your care team if you are unable to keep an appointment. What may interact with this medication? Do not take this medication with any of the following: Live virus vaccines This medication may also interact with the following: Certain antibiotics, such as amikacin, gentamicin, neomycin, polymyxin B, streptomycin, tobramycin, vancomycin Foscarnet This list may not describe all possible interactions. Give your health care provider a list of all the medicines, herbs, non-prescription drugs, or dietary supplements you use. Also tell them if you smoke, drink alcohol, or use illegal drugs. Some items may interact with your medicine. What should I watch for while using this medication? Your condition will be monitored carefully while you are receiving this medication. You may need blood work done while taking this medication. This medication may make you feel generally unwell. This is not uncommon, as chemotherapy can affect healthy cells as well as cancer cells. Report any side effects. Continue your course of treatment even though you feel ill unless your care team tells you to stop. This medication may increase your risk of getting an infection. Call your care team for advice if you get a fever, chills, sore throat, or other symptoms of a cold or flu. Do not treat yourself. Try to avoid being around people who are sick. Avoid taking medications that contain aspirin, acetaminophen, ibuprofen, naproxen, or ketoprofen unless instructed by your care team. These medications may hide a  fever. This medication may increase your risk to bruise or bleed. Call your care team if you notice any unusual bleeding. Be careful brushing or flossing your teeth or using a toothpick because you may get an infection or bleed more easily. If you have any dental work done, tell your dentist you are receiving this medication. Drink fluids as directed while you are taking this medication. This will help protect your kidneys. Call your care team if you get diarrhea. Do not treat yourself. Talk to your care team if you or your partner wish to become pregnant or think you might be pregnant. This medication can cause serious birth defects if taken during pregnancy and for 14 months after the last dose. A negative pregnancy test is required before starting this medication. A reliable form of contraception is recommended while taking this medication and for 14 months after the last dose. Talk to your care team about effective forms of contraception. Do not father a child while taking this medication and for 11 months after the last dose. Use a condom during sex during this time period. Do not breast-feed while taking this medication. This medication may cause infertility. Talk to your care team if you are concerned about your fertility. What side effects may I notice from receiving this medication? Side effects that you should report to your   care team as soon as possible: Allergic reactions--skin rash, itching, hives, swelling of the face, lips, tongue, or throat Eye pain, change in vision, vision loss Hearing loss, ringing in ears Infection--fever, chills, cough, sore throat, wounds that don't heal, pain or trouble when passing urine, general feeling of discomfort or being unwell Kidney injury--decrease in the amount of urine, swelling of the ankles, hands, or feet Low red blood cell level--unusual weakness or fatigue, dizziness, headache, trouble breathing Painful swelling, warmth, or redness of the skin,  blisters or sores at the infusion site Pain, tingling, or numbness in the hands or feet Unusual bruising or bleeding Side effects that usually do not require medical attention (report to your care team if they continue or are bothersome): Hair loss Nausea Vomiting This list may not describe all possible side effects. Call your doctor for medical advice about side effects. You may report side effects to FDA at 1-800-FDA-1088. Where should I keep my medication? This medication is given in a hospital or clinic. It will not be stored at home. NOTE: This sheet is a summary. It may not cover all possible information. If you have questions about this medicine, talk to your doctor, pharmacist, or health care provider.  2023 Elsevier/Gold Standard (2022-02-14 00:00:00)  

## 2022-12-11 ENCOUNTER — Other Ambulatory Visit: Payer: Self-pay

## 2022-12-11 ENCOUNTER — Ambulatory Visit
Admission: RE | Admit: 2022-12-11 | Discharge: 2022-12-11 | Disposition: A | Discharge status: Home / Self Care | Payer: 59 | Source: Ambulatory Visit | Attending: Radiation Oncology | Admitting: Radiation Oncology

## 2022-12-11 DIAGNOSIS — Z5111 Encounter for antineoplastic chemotherapy: Secondary | ICD-10-CM | POA: Diagnosis not present

## 2022-12-11 LAB — RAD ONC ARIA SESSION SUMMARY
Course Elapsed Days: 3
Plan Fractions Treated to Date: 4
Plan Prescribed Dose Per Fraction: 2 Gy
Plan Total Fractions Prescribed: 35
Plan Total Prescribed Dose: 70 Gy
Reference Point Dosage Given to Date: 8 Gy
Reference Point Session Dosage Given: 2 Gy
Session Number: 4

## 2022-12-11 NOTE — Progress Notes (Signed)
Contacted pt regarding his reporting symptoms of "Heart racing" to [employer]. Per Dr. Irene Limbo Pt can reduced the dexamethasone to 1 tab daily for 3 days post chemotherapy (instead of 2 tabs). Pt acknowledged information and verbalized understanding. Pt denies heart racing at this time but he states that it was "racing" this am.

## 2022-12-12 ENCOUNTER — Ambulatory Visit
Admission: RE | Admit: 2022-12-12 | Discharge: 2022-12-12 | Disposition: A | Discharge status: Home / Self Care | Payer: 59 | Source: Ambulatory Visit | Attending: Radiation Oncology | Admitting: Radiation Oncology

## 2022-12-12 ENCOUNTER — Other Ambulatory Visit: Payer: Self-pay | Admitting: Internal Medicine

## 2022-12-12 ENCOUNTER — Other Ambulatory Visit: Payer: Self-pay

## 2022-12-12 DIAGNOSIS — Z5111 Encounter for antineoplastic chemotherapy: Secondary | ICD-10-CM | POA: Diagnosis not present

## 2022-12-12 DIAGNOSIS — C090 Malignant neoplasm of tonsillar fossa: Secondary | ICD-10-CM

## 2022-12-12 LAB — RAD ONC ARIA SESSION SUMMARY
Course Elapsed Days: 4
Plan Fractions Treated to Date: 5
Plan Prescribed Dose Per Fraction: 2 Gy
Plan Total Fractions Prescribed: 35
Plan Total Prescribed Dose: 70 Gy
Reference Point Dosage Given to Date: 10 Gy
Reference Point Session Dosage Given: 2 Gy
Session Number: 5

## 2022-12-14 NOTE — H&P (Signed)
Referring Physician(s): Brunetta Genera  Supervising Physician: Ruthann Cancer  Patient Status:  [facility]  Chief Complaint: "I'm getting a port a cath and stomach tube"   Subjective: Pt known to IR team from left cervical LN bx on 10/21/22. 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 PMH also sig for HTN,HLD. He presents today for port a cath and gastrostomy tube placements while undergoing chemoradiation. He denies fever,HA,CP,dyspnea, cough, abd/back pain,N/V or bleeding.     Past Medical History:  Diagnosis Date   Boils 01/01/2017   Hypercholesteremia    Hypertension    Past Surgical History:  Procedure Laterality Date   COLONOSCOPY N/A 01/29/2017   Procedure: COLONOSCOPY;  Surgeon: Rogene Houston, MD;  Location: [facility];  Service: Endoscopy;  Laterality: N/A;  730   COLONOSCOPY WITH PROPOFOL N/A 04/03/2022   Procedure: COLONOSCOPY WITH PROPOFOL;  Surgeon: Rogene Houston, MD;  Location: [facility];  Service: Endoscopy;  Laterality: N/A;  730   cyst removed from left wrist      EYE SURGERY     grafts N/A 10  years  ago   grafts to gums   HEMOSTASIS CLIP PLACEMENT  04/03/2022   Procedure: HEMOSTASIS CLIP PLACEMENT;  Surgeon: Rogene Houston, MD;  Location: [facility];  Service: Endoscopy;;   OPEN REDUCTION INTERNAL FIXATION (ORIF) DISTAL RADIAL FRACTURE Right 12/28/2013   Procedure: OPEN REDUCTION INTERNAL FIXATION (ORIF) DISTAL RADIAL FRACTURE;  Surgeon: Carole Civil, MD;  Location: [facility];  Service: Orthopedics;  Laterality: Right;   ORIF ULNAR FRACTURE Right 12/28/2013   Procedure: OPEN REDUCTION INTERNAL FIXATION (ORIF) ULNAR FRACTURE;  Surgeon: Carole Civil, MD;  Location: [facility];  Service: Orthopedics;  Laterality: Right;   POLYPECTOMY  04/03/2022   Procedure: POLYPECTOMY;  Surgeon: Rogene Houston, MD;  Location: [facility];  Service: Endoscopy;;   right knee Right teenager   right  knee      Allergies: Patient has no known allergies.  Medications: Prior to Admission medications   Medication Sig Start Date End Date Taking? Authorizing Provider  lidocaine (XYLOCAINE) 2 % solution Patient: Mix 1part 2% viscous lidocaine, 1part H20. Swish & swallow 31m of diluted mixture, 327m before meals and at bedtime, up to QID 12/08/22   SqEppie GibsonMD  amLODipine (NORVASC) 10 MG tablet Take 10 mg by mouth daily. 02/19/22   [provider]  atorvastatin (LIPITOR) 40 MG tablet Take 40 mg by mouth daily.    [provider]  dexamethasone (DECADRON) 4 MG tablet Take 2 tablets daily x 3 days starting the day after cisplatin chemotherapy. Take with food. 12/02/22   KaBrunetta GeneraMD  doxycycline (VIBRAMYCIN) 100 MG capsule Take 1 capsule (100 mg total) by mouth 2 (two) times daily. 09/04/22   LaVolney AmericanPA-C  lansoprazole (PREVACID) 15 MG capsule Take 15 mg by mouth every morning.    [provider]  lidocaine-prilocaine (EMLA) cream Apply to affected area once 12/02/22   KaBrunetta GeneraMD  Multiple Vitamin (MULTIVITAMIN WITH MINERALS) TABS tablet Take 1 tablet by mouth daily.    [provider]  olmesartan (BENICAR) 40 MG tablet Take 40 mg by mouth daily. 02/19/22   [provider]  ondansetron (ZOFRAN) 8 MG tablet Take 1 tablet (8 mg total) by mouth every 8 (eight) hours as needed for nausea or vomiting. Start on the third day after cisplatin. 12/02/22  Brunetta Genera, MD  prochlorperazine (COMPAZINE) 10 MG tablet Take 1 tablet (10 mg total) by mouth every 6 (six) hours as needed (Nausea or vomiting). 12/02/22   Brunetta Genera, MD  sildenafil (VIAGRA) 100 MG tablet Take 100 mg by mouth daily as needed for erectile dysfunction. Patient not taking: Reported on 11/18/2022 01/13/17   [provider]     Vital Signs: Vitals:   12/15/22 0758  BP: (!) 142/93  Pulse: (!) 59  Resp: 16  Temp: 98.3 F  (36.8 C)  SpO2: 96%      Code Status: FULL CODE  Physical Exam awake/alert; chest- CTA bilat; heart- reg rate, occ ectopy; abd- soft,+BS,NT; no LE edema  Imaging: No results found.  Labs:  CBC: Recent Labs    10/13/22 1006 10/30/22 1141 12/10/22 0922  WBC 9.5 11.4* 8.9  HGB 15.5 15.5 14.8  HCT 45.6 45.1 43.0  PLT 268 276 287    COAGS: No results for input(s): "INR", "APTT" in the last 8760 hours.  BMP: Recent Labs    10/13/22 1006 10/30/22 1141 12/01/22 0905 12/10/22 0922  NA 141 140  --  140  K 4.4 4.3  --  4.1  CL 107 105  --  107  CO2 30 31  --  29  GLUCOSE 107* 117*  --  95  BUN 12 15 12 11  $ CALCIUM 9.7 9.4  --  9.3  CREATININE 0.98 0.94 1.05 0.90  GFRNONAA >60 >60 >60 >60    LIVER FUNCTION TESTS: Recent Labs    10/13/22 1006 10/30/22 1141  BILITOT 0.5 0.4  AST 20 35  ALT 20 49*  ALKPHOS 79 74  PROT 6.9 7.5  ALBUMIN 4.2 4.1    Assessment and Plan: Pt known to IR team from left cervical LN bx on 10/21/22. 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 PMH also sig for HTN,HLD. He presents today for port a cath and gastrostomy tube placements while undergoing chemoradiation.Risks and benefits of image guided port-a-catheter /gastrostomy tube placements were discussed with the patient/fiance including, but not limited to bleeding, infection, pneumothorax, or fibrin sheath development, injury to adjacent structures and need for additional procedures.  All of the patient's questions were answered, patient is agreeable to proceed. Consent signed and in chart.    Electronically Signed: D. Rowe Robert, PA-C 12/14/2022, 3:49 PM   I spent a total of 25 minutes at the the patient's bedside AND on the patient's hospital floor or unit, greater than 50% of which was counseling/coordinating care for port a cath/gastrostomy tube placements

## 2022-12-15 ENCOUNTER — Other Ambulatory Visit: Payer: Self-pay | Admitting: Interventional Radiology

## 2022-12-15 ENCOUNTER — Other Ambulatory Visit: Payer: Self-pay

## 2022-12-15 ENCOUNTER — Ambulatory Visit
Admission: RE | Admit: 2022-12-15 | Discharge: 2022-12-15 | Disposition: A | Discharge status: Home / Self Care | Payer: 59 | Source: Ambulatory Visit | Attending: Radiation Oncology | Admitting: Radiation Oncology

## 2022-12-15 ENCOUNTER — Ambulatory Visit (HOSPITAL_COMMUNITY)
Admission: RE | Admit: 2022-12-15 | Discharge: 2022-12-15 | Disposition: A | Discharge status: Home / Self Care | Payer: 59 | Source: Ambulatory Visit | Attending: Hematology | Admitting: Hematology

## 2022-12-15 ENCOUNTER — Other Ambulatory Visit: Payer: Self-pay | Admitting: Hematology

## 2022-12-15 ENCOUNTER — Telehealth: Payer: Self-pay

## 2022-12-15 ENCOUNTER — Encounter (HOSPITAL_COMMUNITY): Payer: Self-pay

## 2022-12-15 ENCOUNTER — Encounter: Payer: Self-pay | Admitting: Hematology

## 2022-12-15 DIAGNOSIS — I10 Essential (primary) hypertension: Secondary | ICD-10-CM | POA: Insufficient documentation

## 2022-12-15 DIAGNOSIS — E785 Hyperlipidemia, unspecified: Secondary | ICD-10-CM | POA: Diagnosis not present

## 2022-12-15 DIAGNOSIS — C090 Malignant neoplasm of tonsillar fossa: Secondary | ICD-10-CM | POA: Diagnosis present

## 2022-12-15 DIAGNOSIS — R591 Generalized enlarged lymph nodes: Secondary | ICD-10-CM

## 2022-12-15 DIAGNOSIS — Z5111 Encounter for antineoplastic chemotherapy: Secondary | ICD-10-CM | POA: Diagnosis not present

## 2022-12-15 HISTORY — PX: IR NASO G TUBE PLC W/FL W/RAD: IMG2321

## 2022-12-15 HISTORY — PX: IR GASTROSTOMY TUBE MOD SED: IMG625

## 2022-12-15 HISTORY — PX: IR IMAGING GUIDED PORT INSERTION: IMG5740

## 2022-12-15 LAB — RAD ONC ARIA SESSION SUMMARY
Course Elapsed Days: 7
Plan Fractions Treated to Date: 6
Plan Prescribed Dose Per Fraction: 2 Gy
Plan Total Fractions Prescribed: 35
Plan Total Prescribed Dose: 70 Gy
Reference Point Dosage Given to Date: 12 Gy
Reference Point Session Dosage Given: 2 Gy
Session Number: 6

## 2022-12-15 LAB — CBC WITH DIFFERENTIAL/PLATELET
Abs Immature Granulocytes: 0.04 10*3/uL (ref 0.00–0.07)
Basophils Absolute: 0 10*3/uL (ref 0.0–0.1)
Basophils Relative: 0 %
Eosinophils Absolute: 0.2 10*3/uL (ref 0.0–0.5)
Eosinophils Relative: 3 %
HCT: 43.3 % (ref 39.0–52.0)
Hemoglobin: 14.4 g/dL (ref 13.0–17.0)
Immature Granulocytes: 0 %
Lymphocytes Relative: 36 %
Lymphs Abs: 3.3 10*3/uL (ref 0.7–4.0)
MCH: 30.3 pg (ref 26.0–34.0)
MCHC: 33.3 g/dL (ref 30.0–36.0)
MCV: 91.2 fL (ref 80.0–100.0)
Monocytes Absolute: 1 10*3/uL (ref 0.1–1.0)
Monocytes Relative: 11 %
Neutro Abs: 4.6 10*3/uL (ref 1.7–7.7)
Neutrophils Relative %: 50 %
Platelets: 291 10*3/uL (ref 150–400)
RBC: 4.75 MIL/uL (ref 4.22–5.81)
RDW: 13.3 % (ref 11.5–15.5)
WBC: 9.1 10*3/uL (ref 4.0–10.5)
nRBC: 0.2 % (ref 0.0–0.2)

## 2022-12-15 LAB — PROTIME-INR
INR: 0.9 (ref 0.8–1.2)
Prothrombin Time: 12.3 seconds (ref 11.4–15.2)

## 2022-12-15 LAB — BASIC METABOLIC PANEL
Anion gap: 8 (ref 5–15)
BUN: 18 mg/dL (ref 6–20)
CO2: 26 mmol/L (ref 22–32)
Calcium: 8.7 mg/dL — ABNORMAL LOW (ref 8.9–10.3)
Chloride: 105 mmol/L (ref 98–111)
Creatinine, Ser: 0.77 mg/dL (ref 0.61–1.24)
GFR, Estimated: >60 mL/min (ref >60)
Glucose, Bld: 99 mg/dL (ref 70–99)
Potassium: 3.9 mmol/L (ref 3.5–5.1)
Sodium: 139 mmol/L (ref 135–145)

## 2022-12-15 MED ORDER — MIDAZOLAM HCL 2 MG/2ML IJ SOLN
INTRAMUSCULAR | Status: AC | PRN
  Administered 2022-12-15: 1 mg via INTRAVENOUS
  Administered 2022-12-15 (×3): .5 mg via INTRAVENOUS
  Administered 2022-12-15: 1 mg via INTRAVENOUS
  Administered 2022-12-15: .5 mg via INTRAVENOUS

## 2022-12-15 MED ORDER — FENTANYL CITRATE (PF) 100 MCG/2ML IJ SOLN
INTRAMUSCULAR | Status: AC
  Filled 2022-12-15: qty 2

## 2022-12-15 MED ORDER — LIDOCAINE VISCOUS HCL 2 % MT SOLN
OROMUCOSAL | Status: AC
  Administered 2022-12-15: 8 mL
  Filled 2022-12-15: qty 15

## 2022-12-15 MED ORDER — OXYCODONE HCL 5 MG PO TABS: 5.0000 mg | ORAL_TABLET | Freq: Four times a day (QID) | ORAL | 0 refills | Status: DC | PRN

## 2022-12-15 MED ORDER — LIDOCAINE HCL 1 % IJ SOLN
INTRAMUSCULAR | Status: AC
  Filled 2022-12-15: qty 20

## 2022-12-15 MED ORDER — CEFAZOLIN SODIUM-DEXTROSE 2-4 GM/100ML-% IV SOLN
2.0000 g | INTRAVENOUS | Status: AC
  Administered 2022-12-15: 2 g via INTRAVENOUS

## 2022-12-15 MED ORDER — MIDAZOLAM HCL 2 MG/2ML IJ SOLN
INTRAMUSCULAR | Status: AC
  Filled 2022-12-15: qty 2

## 2022-12-15 MED ORDER — FENTANYL CITRATE (PF) 100 MCG/2ML IJ SOLN
INTRAMUSCULAR | Status: AC | PRN
  Administered 2022-12-15: 50 ug via INTRAVENOUS
  Administered 2022-12-15: 25 ug via INTRAVENOUS
  Administered 2022-12-15: 50 ug via INTRAVENOUS
  Administered 2022-12-15: 25 ug via INTRAVENOUS
  Administered 2022-12-15: 50 ug via INTRAVENOUS

## 2022-12-15 MED ORDER — SODIUM CHLORIDE 0.9 % IV SOLN: INTRAVENOUS | Status: DC

## 2022-12-15 MED ORDER — CEFAZOLIN SODIUM-DEXTROSE 2-4 GM/100ML-% IV SOLN
INTRAVENOUS | Status: AC
  Filled 2022-12-15: qty 100

## 2022-12-15 MED ORDER — LIDOCAINE-EPINEPHRINE 1 %-1:100000 IJ SOLN
INTRAMUSCULAR | Status: AC
  Administered 2022-12-15: 20 mL via INTRADERMAL
  Filled 2022-12-15: qty 1

## 2022-12-15 MED ORDER — IOHEXOL 300 MG/ML  SOLN
15.0000 mL | Freq: Once | INTRAMUSCULAR | Status: AC | PRN
  Administered 2022-12-15: 15 mL

## 2022-12-15 MED ORDER — LIDOCAINE-EPINEPHRINE 1 %-1:100000 IJ SOLN
INTRAMUSCULAR | Status: AC
  Administered 2022-12-15: 10 mL
  Filled 2022-12-15: qty 1

## 2022-12-15 MED ORDER — HEPARIN SOD (PORK) LOCK FLUSH 100 UNIT/ML IV SOLN
INTRAVENOUS | Status: AC
  Administered 2022-12-15: 500 [IU] via INTRAVENOUS
  Filled 2022-12-15: qty 5

## 2022-12-15 NOTE — Telephone Encounter (Signed)
Notified Patient by voicemail of prior authorization approval for Oxycodone HCL 11m Tablets. Medication is approved through 12/14/2023.

## 2022-12-15 NOTE — Sedation Documentation (Signed)
Stomach inflated under direction of Dr. Serafina Royals.

## 2022-12-15 NOTE — Procedures (Signed)
Interventional Radiology Procedure Note  Procedure:  1) Single Lumen Power Port Placement   2) Placement of percutaneous 31F balloon retention gastrostomy tube.  Access:  Right internal jugular vein  Findings: Catheter tip positioned at cavoatrial junction. Port is ready for immediate use.   Complications: None  EBL: < 10 mL  Recommendations:  - Ok to shower in 24 hours - Do not submerge for 7 days - Routine line care  - NPO except for sips and chips remainder of today and overnight - May advance diet as tolerated and begin using tube tomorrow morning   Ruthann Cancer, MD Pager: [phone]

## 2022-12-15 NOTE — Sedation Documentation (Signed)
Port-A-Cath to right chest completed by Dr. Serafina Royals. Locked with heparin, ready for use. Dermabond topically to site.

## 2022-12-15 NOTE — Discharge Instructions (Addendum)
NOTHING BY MOUTH for 24 hours; ICE CHIPS only.  Please call Interventional Radiology clinic [phone] with any questions or concerns.  You may remove your port dressing and shower tomorrow. Cleanse G Tube site with soap and water; dress with triple antibiotic ointment; cover with gauze daily for next 7 days. Do not use scissors near tube.   DO NOT use EMLA cream for 2 weeks after port placement as this cream will remove surgical glue on your incision.

## 2022-12-16 ENCOUNTER — Ambulatory Visit
Admission: RE | Admit: 2022-12-16 | Discharge: 2022-12-16 | Disposition: A | Discharge status: Home / Self Care | Payer: 59 | Source: Ambulatory Visit | Attending: Radiation Oncology | Admitting: Radiation Oncology

## 2022-12-16 ENCOUNTER — Other Ambulatory Visit: Payer: Self-pay

## 2022-12-16 DIAGNOSIS — Z5111 Encounter for antineoplastic chemotherapy: Secondary | ICD-10-CM | POA: Diagnosis not present

## 2022-12-16 LAB — RAD ONC ARIA SESSION SUMMARY
Course Elapsed Days: 8
Plan Fractions Treated to Date: 7
Plan Prescribed Dose Per Fraction: 2 Gy
Plan Total Fractions Prescribed: 35
Plan Total Prescribed Dose: 70 Gy
Reference Point Dosage Given to Date: 14 Gy
Reference Point Session Dosage Given: 2 Gy
Session Number: 7

## 2022-12-16 MED FILL — Dexamethasone Sodium Phosphate Inj 100 MG/10ML: INTRAMUSCULAR | Qty: 1 | Status: AC

## 2022-12-16 MED FILL — Fosaprepitant Dimeglumine For IV Infusion 150 MG (Base Eq): INTRAVENOUS | Qty: 5 | Status: AC

## 2022-12-17 ENCOUNTER — Inpatient Hospital Stay: Payer: 59 | Admitting: Dietician

## 2022-12-17 ENCOUNTER — Inpatient Hospital Stay: Payer: 59

## 2022-12-17 ENCOUNTER — Other Ambulatory Visit: Payer: Self-pay | Admitting: Hematology

## 2022-12-17 ENCOUNTER — Inpatient Hospital Stay (HOSPITAL_BASED_OUTPATIENT_CLINIC_OR_DEPARTMENT_OTHER): Payer: 59 | Admitting: Hematology

## 2022-12-17 ENCOUNTER — Other Ambulatory Visit: Payer: Self-pay

## 2022-12-17 ENCOUNTER — Ambulatory Visit
Admission: RE | Admit: 2022-12-17 | Discharge: 2022-12-17 | Disposition: A | Discharge status: Home / Self Care | Payer: 59 | Source: Ambulatory Visit | Attending: Radiation Oncology | Admitting: Radiation Oncology

## 2022-12-17 VITALS — BP 111/68 | HR 69 | Temp 98.1°F | Resp 20 | Wt 247.6 lb

## 2022-12-17 DIAGNOSIS — C090 Malignant neoplasm of tonsillar fossa: Secondary | ICD-10-CM | POA: Diagnosis not present

## 2022-12-17 DIAGNOSIS — Z5111 Encounter for antineoplastic chemotherapy: Secondary | ICD-10-CM | POA: Diagnosis not present

## 2022-12-17 DIAGNOSIS — Z95828 Presence of other vascular implants and grafts: Secondary | ICD-10-CM

## 2022-12-17 LAB — BASIC METABOLIC PANEL - CANCER CENTER ONLY
Anion gap: 4 — ABNORMAL LOW (ref 5–15)
BUN: 16 mg/dL (ref 6–20)
CO2: 31 mmol/L (ref 22–32)
Calcium: 9 mg/dL (ref 8.9–10.3)
Chloride: 102 mmol/L (ref 98–111)
Creatinine: 0.8 mg/dL (ref 0.61–1.24)
GFR, Estimated: >60 mL/min (ref >60)
Glucose, Bld: 93 mg/dL (ref 70–99)
Potassium: 4.1 mmol/L (ref 3.5–5.1)
Sodium: 137 mmol/L (ref 135–145)

## 2022-12-17 LAB — CBC WITH DIFFERENTIAL (CANCER CENTER ONLY)
Abs Immature Granulocytes: 0.04 10*3/uL (ref 0.00–0.07)
Basophils Absolute: 0 10*3/uL (ref 0.0–0.1)
Basophils Relative: 0 %
Eosinophils Absolute: 0.2 10*3/uL (ref 0.0–0.5)
Eosinophils Relative: 2 %
HCT: 41 % (ref 39.0–52.0)
Hemoglobin: 14 g/dL (ref 13.0–17.0)
Immature Granulocytes: 0 %
Lymphocytes Relative: 26 %
Lymphs Abs: 2.6 10*3/uL (ref 0.7–4.0)
MCH: 30.6 pg (ref 26.0–34.0)
MCHC: 34.1 g/dL (ref 30.0–36.0)
MCV: 89.5 fL (ref 80.0–100.0)
Monocytes Absolute: 1 10*3/uL (ref 0.1–1.0)
Monocytes Relative: 10 %
Neutro Abs: 5.9 10*3/uL (ref 1.7–7.7)
Neutrophils Relative %: 62 %
Platelet Count: 274 10*3/uL (ref 150–400)
RBC: 4.58 MIL/uL (ref 4.22–5.81)
RDW: 13.2 % (ref 11.5–15.5)
WBC Count: 9.8 10*3/uL (ref 4.0–10.5)
nRBC: 0 % (ref 0.0–0.2)

## 2022-12-17 LAB — RAD ONC ARIA SESSION SUMMARY
Course Elapsed Days: 9
Plan Fractions Treated to Date: 8
Plan Prescribed Dose Per Fraction: 2 Gy
Plan Total Fractions Prescribed: 35
Plan Total Prescribed Dose: 70 Gy
Reference Point Dosage Given to Date: 16 Gy
Reference Point Session Dosage Given: 2 Gy
Session Number: 8

## 2022-12-17 LAB — MAGNESIUM: Magnesium: 2 mg/dL (ref 1.7–2.4)

## 2022-12-17 MED ORDER — SODIUM CHLORIDE 0.9 % IV SOLN
150.0000 mg | Freq: Once | INTRAVENOUS | Status: AC
  Administered 2022-12-17: 150 mg via INTRAVENOUS
  Filled 2022-12-17: qty 150

## 2022-12-17 MED ORDER — SODIUM CHLORIDE 0.9% FLUSH
10.0000 mL | INTRAVENOUS | Status: DC | PRN
  Administered 2022-12-17: 10 mL

## 2022-12-17 MED ORDER — MAGNESIUM SULFATE 2 GM/50ML IV SOLN
2.0000 g | Freq: Once | INTRAVENOUS | Status: AC
  Administered 2022-12-17: 2 g via INTRAVENOUS
  Filled 2022-12-17: qty 50

## 2022-12-17 MED ORDER — SENNOSIDES-DOCUSATE SODIUM 8.6-50 MG PO TABS: 2.0000 | ORAL_TABLET | Freq: Every day | ORAL | 2 refills | Status: DC

## 2022-12-17 MED ORDER — SODIUM CHLORIDE 0.9 % IV SOLN
10.0000 mg | Freq: Once | INTRAVENOUS | Status: AC
  Administered 2022-12-17: 10 mg via INTRAVENOUS
  Filled 2022-12-17: qty 10

## 2022-12-17 MED ORDER — HEPARIN SOD (PORK) LOCK FLUSH 100 UNIT/ML IV SOLN
500.0000 [IU] | Freq: Once | INTRAVENOUS | Status: AC | PRN
  Administered 2022-12-17: 500 [IU]

## 2022-12-17 MED ORDER — SODIUM CHLORIDE 0.9 % IV SOLN: Freq: Once | INTRAVENOUS | Status: AC

## 2022-12-17 MED ORDER — POTASSIUM CHLORIDE IN NACL 20-0.9 MEQ/L-% IV SOLN
Freq: Once | INTRAVENOUS | Status: AC
  Filled 2022-12-17: qty 1000

## 2022-12-17 MED ORDER — PALONOSETRON HCL INJECTION 0.25 MG/5ML
0.2500 mg | Freq: Once | INTRAVENOUS | Status: AC
  Administered 2022-12-17: 0.25 mg via INTRAVENOUS
  Filled 2022-12-17: qty 5

## 2022-12-17 MED ORDER — SODIUM CHLORIDE 0.9 % IV SOLN
40.0000 mg/m2 | Freq: Once | INTRAVENOUS | Status: AC
  Administered 2022-12-17: 100 mg via INTRAVENOUS
  Filled 2022-12-17: qty 100

## 2022-12-17 NOTE — Patient Instructions (Signed)

## 2022-12-17 NOTE — Progress Notes (Signed)
Patient seen by MD today  Vitals are within treatment parameters.  Labs reviewed: and are within treatment parameters.  Per physician team, patient is ready for treatment and there are NO modifications to the treatment plan.

## 2022-12-17 NOTE — Progress Notes (Signed)
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 male with medical history significant for hypertension and hypercholesteremia presents to the diagnostic clinic for evaluation for left neck lymphadenopathy.  On review of the previous records, Mr. Theisen presented to the urgent care on 09/04/2022 with a large painful left neck mass. It was initially felt to be infectious so treated with doxycycline with little improvement. He followed by with his PCP on 09/22/2022 who ordered a ultrasound on 10/01/2022 which revealed a hypoechoic solid mass in the left neck measuring 4.7 x 2.7 x 4.2 cm. CT neck followed on 10/06/2022 which showed bulky and necrotic adenopathy in the left level 2 neck.   On exam today, Mr. [name] reports that his energy levels are fairly stable. He is able to complete all his ADLs on his own. He is currently on FMLA while he undergoes workup. He reports having a good appetite and denies any weight loss. He denies nausea, vomiting or abdominal pain. His bowel habits are unchanged without any recurrent episodes of diarrhea or constipation. He denies easy bruising or signs of active bleeding. He reports having intermittent episodes of positional dizziness without any syncopal episodes.He denies fevers, chills, night sweats, shortness of breath, chest pain, cough, peripheral edema, neuropathy, pruritus or rash. He has no other complaints. Rest of the 10 point ROS is below.   INTERVAL HISTORY  Jonathan Hudson 57 y.o. male is connected via phone for continued evaluation and management of his newly diagnosed  metastatic head and neck squamous cell carcinoma. He is here to start cycle 2 of his treatment.   Patient was last seen by me on 12/01/2022 and he complained of itchiness throughout his body (especially on his back).  Patient reports he has been doing well overall without any new medical concerns. He has been staying hydrated and eating well. He denies fever, chills, night sweats, abdominal pain, chest pain, or leg swelling. He notes his itchiness has improved.   He reports his recent IMRT radiation treatment on Monday 12/15/2022 and reports he his recovering from it. He denies any toxicities with IMRT radiation treatment.   Patient notes he tolerated his cycle 1 of his treatment well without any toxicities.    MEDICAL HISTORY:  Past Medical History:  Diagnosis Date   Boils 01/01/2017   Hypercholesteremia    Hypertension     SURGICAL HISTORY: Past Surgical History:  Procedure Laterality Date   COLONOSCOPY N/A 01/29/2017   Procedure: COLONOSCOPY;  Surgeon: Rogene Houston, MD;  [facility];  Service: Endoscopy;  Laterality: N/A;  730   COLONOSCOPY WITH PROPOFOL N/A 04/03/2022   Procedure: COLONOSCOPY WITH PROPOFOL;  Surgeon: Rogene Houston, MD;  Location: [facility];  Service: Endoscopy;  Laterality: N/A;  730   cyst removed from left wrist      EYE SURGERY     grafts N/A 10  years  ago   grafts to gums   HEMOSTASIS CLIP PLACEMENT  04/03/2022   Procedure: HEMOSTASIS CLIP PLACEMENT;  Surgeon: Rogene Houston, MD;  Location: [facility];  Service: Endoscopy;;   IR GASTROSTOMY TUBE MOD SED  12/15/2022  IR IMAGING GUIDED PORT INSERTION  12/15/2022   IR NASO G TUBE PLC W/FL W/RAD  12/15/2022   OPEN REDUCTION INTERNAL FIXATION (ORIF) DISTAL RADIAL FRACTURE Right 12/28/2013   Procedure: OPEN REDUCTION INTERNAL FIXATION (ORIF) DISTAL RADIAL FRACTURE;  Surgeon: Carole Civil, MD;  Location: [facility];  Service: Orthopedics;  Laterality: Right;   ORIF ULNAR FRACTURE  Right 12/28/2013   Procedure: OPEN REDUCTION INTERNAL FIXATION (ORIF) ULNAR FRACTURE;  Surgeon: Carole Civil, MD;  Location: [facility];  Service: Orthopedics;  Laterality: Right;   POLYPECTOMY  04/03/2022   Procedure: POLYPECTOMY;  Surgeon: Rogene Houston, MD;  [facility]: [facility];  Service: Endoscopy;;   right knee Right teenager   right knee     SOCIAL HISTORY: Social History   Socioeconomic History   Marital status: Divorced    Spouse name: Not on file   Number of children: Not on file   Years of education: Not on file   Highest education level: Not on file  Occupational History   Not on file  Tobacco Use   Smoking status: Never   Smokeless tobacco: Never  Vaping Use   Vaping Use: Never used  Substance and Sexual Activity   Alcohol use: Yes    Comment: Occ  beer or wine    Drug use: No   Sexual activity: Not on file  Other Topics Concern   Not on file  Social History Narrative   Not on file   Social Determinants of Health   Financial Resource Strain: Low Risk  (12/03/2022)   Overall Financial Resource Strain (CARDIA)    Difficulty of Paying Living Expenses: Not hard at all  Food Insecurity: No Food Insecurity (12/03/2022)   Hunger Vital Sign    Worried About Running Out of Food in the Last Year: Never true    Ran Out of Food in the Last Year: Never true  Transportation Needs: No Transportation Needs (12/03/2022)   PRAPARE - Hydrologist (Medical): No    Lack of Transportation (Non-Medical): No  Physical Activity: Not on file  Stress: No Stress Concern Present (12/03/2022)   [place]    Feeling of Stress : Not at all  Social Connections: Not on file  Intimate Partner Violence: Not At Risk (12/03/2022)   Humiliation, Afraid, Rape, and Kick questionnaire    Fear of Current or Ex-Partner: No    Emotionally Abused: No    Physically Abused: No    Sexually Abused:  No    FAMILY HISTORY: Family History  Problem Relation Age of Onset   Cancer Mother        small bowel spread to liver   Pancreatic cancer Father    Cancer Maternal Aunt    Brain cancer Maternal Uncle    Cancer Paternal Aunt    Breast cancer Cousin    Cancer Paternal Uncle     ALLERGIES:  has No Known Allergies.  MEDICATIONS:  Current Outpatient Medications  Medication Sig Dispense Refill   lidocaine (XYLOCAINE) 2 % solution Patient: Mix 1part 2% viscous lidocaine, 1part H20. Swish & swallow 76m of diluted mixture, 371m before meals and at bedtime, up to QID 200 mL 3   oxyCODONE (OXY IR/ROXICODONE) 5 MG immediate release tablet Take 1 tablet (5 mg total) by mouth every 6 (six) hours as needed for severe pain or moderate pain. 30 tablet 0   amLODipine (NORVASC) 10 MG tablet Take  10 mg by mouth daily.     atorvastatin (LIPITOR) 40 MG tablet Take 40 mg by mouth daily.     dexamethasone (DECADRON) 4 MG tablet Take 2 tablets daily x 3 days starting the day after cisplatin chemotherapy. Take with food. 30 tablet 1   doxycycline (VIBRAMYCIN) 100 MG capsule Take 1 capsule (100 mg total) by mouth 2 (two) times daily. 20 capsule 0   lansoprazole (PREVACID) 15 MG capsule Take 15 mg by mouth every morning.     lidocaine-prilocaine (EMLA) cream Apply to affected area once 30 g 3   Multiple Vitamin (MULTIVITAMIN WITH MINERALS) TABS tablet Take 1 tablet by mouth daily.     olmesartan (BENICAR) 40 MG tablet Take 40 mg by mouth daily.     ondansetron (ZOFRAN) 8 MG tablet Take 1 tablet (8 mg total) by mouth every 8 (eight) hours as needed for nausea or vomiting. Start on the third day after cisplatin. 30 tablet 1   prochlorperazine (COMPAZINE) 10 MG tablet Take 1 tablet (10 mg total) by mouth every 6 (six) hours as needed (Nausea or vomiting). 30 tablet 1   sildenafil (VIAGRA) 100 MG tablet Take 100 mg by mouth daily as needed for erectile dysfunction. (Patient not taking: Reported on 11/18/2022)  5    No current facility-administered medications for this visit.    REVIEW OF SYSTEMS:   10 Point review of Systems was done is negative except as noted above.  PHYSICAL EXAMINATION:telemedicine visit  LABORATORY DATA:  I have reviewed the data as listed .    Latest Ref Rng & Units 12/15/2022    7:39 AM 12/10/2022    9:22 AM 10/30/2022   11:41 AM  CBC  WBC 4.0 - 10.5 K/uL 9.1  8.9  11.4   Hemoglobin 13.0 - 17.0 g/dL 14.4  14.8  15.5   Hematocrit 39.0 - 52.0 % 43.3  43.0  45.1   Platelets 150 - 400 K/uL 291  287  276    .    Latest Ref Rng & Units 12/15/2022    7:39 AM 12/10/2022    9:22 AM 12/01/2022    9:05 AM  CMP  Glucose 70 - 99 mg/dL 99  95    BUN 6 - 20 mg/dL 18  11  12   $ Creatinine 0.61 - 1.24 mg/dL 0.77  0.90  1.05   Sodium 135 - 145 mmol/L 139  140    Potassium 3.5 - 5.1 mmol/L 3.9  4.1    Chloride 98 - 111 mmol/L 105  107    CO2 22 - 32 mmol/L 26  29    Calcium 8.9 - 10.3 mg/dL 8.7  9.3        SURGICAL PATHOLOGY  CASE: [id number]  PATIENT: [name]  Surgical Pathology Report      Specimen Submitted:  A. Lymph node, left neck   Clinical History: Necrotic node CT, possible squamous cell.  No history  of CA       DIAGNOSIS:  A. LYMPH NODE, LEFT NECK; ULTRASOUND-GUIDED BIOPSY:  - METASTATIC SQUAMOUS CELL CARCINOMA, P16 POSITIVE.  - NECROTIC DEBRIS.   Comment:  The carcinoma is positive for p63 and p16. This pattern of staining  supports the above diagnosis.  There is insufficient material for ancillary molecular testing.   . Lab Results  Component Value Date   LDH 198 (H) 10/13/2022   Component     Latest Ref Rng 10/13/2022  HIV Screen 4th Generation wRfx  Non Reactive  Non Reactive   HCV Ab     NON REACTIVE  NON REACTIVE   Hepatitis B Surface Ag     NON REACTIVE  NON REACTIVE   Hep B S Ab     NON REACTIVE  NON REACTIVE   Hep B Core Total Ab     NON REACTIVE  NON REACTIVE   CRP     <1.0 mg/dL <0.5   Sed Rate     0 -  16 mm/hr 7   LDH     98 - 192 U/L 198 (H)     Legend: (H) High RADIOGRAPHIC STUDIES: I have personally reviewed the radiological images as listed and agreed with the findings in the report. IR Naso G Tube Plc W/FL W/Rad  Result Date: 12/15/2022 INDICATION: 57 year old male with history of head neck cancer presenting for Port-A-Cath and gastrostomy tube placements. EXAM: 1. IMPLANTED PORT A CATH PLACEMENT WITH ULTRASOUND AND FLUOROSCOPIC GUIDANCE 2. Fluoroscopic guided nasogastric tube placement 3. Fluoroscopic guided percutaneous gastrostomy tube placement COMPARISON:  None Available. MEDICATIONS: Ancef 2 gm IV; The antibiotic was administered within an appropriate time interval prior to skin puncture. ANESTHESIA/SEDATION: Moderate (conscious) sedation was employed during this procedure. A total of Versed 4 mg and Fentanyl 200 mcg was administered intravenously. Moderate Sedation Time: 31 minutes. The patient's level of consciousness and vital signs were monitored continuously by radiology nursing throughout the procedure under my direct supervision. CONTRAST:  None FLUOROSCOPY TIME:  Eleven mGy COMPLICATIONS: None immediate. PROCEDURE: The procedure, risks, benefits, and alternatives were explained to the patient. Questions regarding the procedure were encouraged and answered. The patient understands and consents to the procedure. The right neck, chest, and upper abdomen were prepped with chlorhexidine in a sterile fashion, and a sterile drape was applied covering the operative field. Maximum barrier sterile technique with sterile gowns and gloves were used for the procedure. A timeout was performed prior to the initiation of the procedure. Ultrasound was used to examine the jugular vein which was compressible and free of internal echoes. A skin marker was used to demarcate the planned venotomy and port pocket incision sites. Local anesthesia was provided to these sites and the subcutaneous tunnel track  with 1% lidocaine with 1:100,000 epinephrine. A small incision was created at the jugular access site and blunt dissection was performed of the subcutaneous tissues. Under ultrasound guidance, the jugular vein was accessed with a 21 ga micropuncture needle and an 0.018" wire was inserted to the superior vena cava. Real-time ultrasound guidance was utilized for vascular access including the acquisition of a permanent ultrasound image documenting patency of the accessed vessel. A 5 Fr micopuncture set was then used, through which a 0.035" Rosen wire was passed under fluoroscopic guidance into the inferior vena cava. An 8 Fr dilator was then placed over the wire. A subcutaneous port pocket was then created along the upper chest wall utilizing a combination of sharp and blunt dissection. The pocket was irrigated with sterile saline, packed with gauze, and observed for hemorrhage. A single lumen "ISP" sized power injectable port was chosen for placement. The 8 Fr catheter was tunneled from the port pocket site to the venotomy incision. The port was placed in the pocket. The external catheter was trimmed to appropriate length. The dilator was exchanged for an 8 Fr peel-away sheath under fluoroscopic guidance. The catheter was then placed through the sheath and the sheath was removed. Final catheter positioning was confirmed and documented with a fluoroscopic  spot radiograph. The port was accessed with a Huber needle, aspirated, and flushed with heparinized saline. The deep dermal layer of the port pocket incision was closed with interrupted 3-0 Vicryl suture. Dermabond was then placed over the port pocket and neck incisions. Attention was then turned toward placement of the gastrostomy tube. Pre-procedure abdominal film confirmed visualization of the transverse colon. An angled 5-French catheter was passed through the nares into the stomach. The patient was prepped and draped in usual sterile fashion. The stomach was  insufflated with air via the indwelling nasogastric tube. Under fluoroscopy, a puncture site was selected and local analgesia achieved with 1% lidocaine infiltrated subcutaneously. Under fluoroscopic guidance, a gastropexy needle was passed into the stomach and the T-bar suture was released. Entry into the stomach was confirmed with fluoroscopy, aspiration of air, and injection of contrast material. This was repeated with an additional gastropexy suture (for a total of 2 fasteners). At the center of these gastropexy sutures, a dermatotomy was performed. An 18 gauge needle was passed into the stomach at the site of this dermatotomy, and position within the gastric lumen again confirmed under fluoroscopy using aspiration of air and contrast injection. An Amplatz guidewire was passed through this needle and intraluminal placement within the stomach was confirmed by fluoroscopy. The needle was removed. Over the guidewire, the percutaneous tract was dilated using a 10 mm non-compliant balloon. The balloon was deflated, then pushed into the gastric lumen followed in concert by the 20 Fr gastrostomy tube. The retention balloon of the percutaneous gastrostomy tube was inflated with 10 mL of sterile water. The tube was withdrawn until the retention balloon was at the edge of the gastric lumen. The external bumper was brought to the abdominal wall. Contrast was injected through the gastrostomy tube, confirming intraluminal positioning. The patient tolerated the procedures well without any immediate post-procedural complications. IMPRESSION: 1. Successful placement of a power injectable Port-A-Cath via the right internal jugular vein. The catheter is ready for immediate use. 2. Successful placement of 20 French balloon retention percutaneous gastrostomy tube. PLAN: The patient will be scheduled for 6 months routine gastrostomy tube exchange. Ruthann Cancer, MD [facility]  Radiology Electronically Signed   By: Ruthann Cancer M.D.   On: 12/15/2022 11:52   IR IMAGING GUIDED PORT INSERTION  Result Date: 12/15/2022 INDICATION: 57 year old male with history of head neck cancer presenting for Port-A-Cath and gastrostomy tube placements. EXAM: 1. IMPLANTED PORT A CATH PLACEMENT WITH ULTRASOUND AND FLUOROSCOPIC GUIDANCE 2. Fluoroscopic guided nasogastric tube placement 3. Fluoroscopic guided percutaneous gastrostomy tube placement COMPARISON:  None Available. MEDICATIONS: Ancef 2 gm IV; The antibiotic was administered within an appropriate time interval prior to skin puncture. ANESTHESIA/SEDATION: Moderate (conscious) sedation was employed during this procedure. A total of Versed 4 mg and Fentanyl 200 mcg was administered intravenously. Moderate Sedation Time: 31 minutes. The patient's level of consciousness and vital signs were monitored continuously by radiology nursing throughout the procedure under my direct supervision. CONTRAST:  None FLUOROSCOPY TIME:  Eleven mGy COMPLICATIONS: None immediate. PROCEDURE: The procedure, risks, benefits, and alternatives were explained to the patient. Questions regarding the procedure were encouraged and answered. The patient understands and consents to the procedure. The right neck, chest, and upper abdomen were prepped with chlorhexidine in a sterile fashion, and a sterile drape was applied covering the operative field. Maximum barrier sterile technique with sterile gowns and gloves were used for the procedure. A timeout was performed prior to the initiation of the  procedure. Ultrasound was used to examine the jugular vein which was compressible and free of internal echoes. A skin marker was used to demarcate the planned venotomy and port pocket incision sites. Local anesthesia was provided to these sites and the subcutaneous tunnel track with 1% lidocaine with 1:100,000 epinephrine. A small incision was created at the jugular access site and blunt  dissection was performed of the subcutaneous tissues. Under ultrasound guidance, the jugular vein was accessed with a 21 ga micropuncture needle and an 0.018" wire was inserted to the superior vena cava. Real-time ultrasound guidance was utilized for vascular access including the acquisition of a permanent ultrasound image documenting patency of the accessed vessel. A 5 Fr micopuncture set was then used, through which a 0.035" Rosen wire was passed under fluoroscopic guidance into the inferior vena cava. An 8 Fr dilator was then placed over the wire. A subcutaneous port pocket was then created along the upper chest wall utilizing a combination of sharp and blunt dissection. The pocket was irrigated with sterile saline, packed with gauze, and observed for hemorrhage. A single lumen "ISP" sized power injectable port was chosen for placement. The 8 Fr catheter was tunneled from the port pocket site to the venotomy incision. The port was placed in the pocket. The external catheter was trimmed to appropriate length. The dilator was exchanged for an 8 Fr peel-away sheath under fluoroscopic guidance. The catheter was then placed through the sheath and the sheath was removed. Final catheter positioning was confirmed and documented with a fluoroscopic spot radiograph. The port was accessed with a Huber needle, aspirated, and flushed with heparinized saline. The deep dermal layer of the port pocket incision was closed with interrupted 3-0 Vicryl suture. Dermabond was then placed over the port pocket and neck incisions. Attention was then turned toward placement of the gastrostomy tube. Pre-procedure abdominal film confirmed visualization of the transverse colon. An angled 5-French catheter was passed through the nares into the stomach. The patient was prepped and draped in usual sterile fashion. The stomach was insufflated with air via the indwelling nasogastric tube. Under fluoroscopy, a puncture site was selected and local  analgesia achieved with 1% lidocaine infiltrated subcutaneously. Under fluoroscopic guidance, a gastropexy needle was passed into the stomach and the T-bar suture was released. Entry into the stomach was confirmed with fluoroscopy, aspiration of air, and injection of contrast material. This was repeated with an additional gastropexy suture (for a total of 2 fasteners). At the center of these gastropexy sutures, a dermatotomy was performed. An 18 gauge needle was passed into the stomach at the site of this dermatotomy, and position within the gastric lumen again confirmed under fluoroscopy using aspiration of air and contrast injection. An Amplatz guidewire was passed through this needle and intraluminal placement within the stomach was confirmed by fluoroscopy. The needle was removed. Over the guidewire, the percutaneous tract was dilated using a 10 mm non-compliant balloon. The balloon was deflated, then pushed into the gastric lumen followed in concert by the 20 Fr gastrostomy tube. The retention balloon of the percutaneous gastrostomy tube was inflated with 10 mL of sterile water. The tube was withdrawn until the retention balloon was at the edge of the gastric lumen. The external bumper was brought to the abdominal wall. Contrast was injected through the gastrostomy tube, confirming intraluminal positioning. The patient tolerated the procedures well without any immediate post-procedural complications. IMPRESSION: 1. Successful placement of a power injectable Port-A-Cath via the right internal jugular vein. The catheter is  ready for immediate use. 2. Successful placement of 20 French balloon retention percutaneous gastrostomy tube. PLAN: The patient will be scheduled for 6 months routine gastrostomy tube exchange. Ruthann Cancer, MD [facility] Radiology Electronically Signed   By: Ruthann Cancer M.D.   On: 12/15/2022 11:01   IR Gastrostomy Tube  Result Date:  12/15/2022 INDICATION: 57 year old male with history of head neck cancer presenting for Port-A-Cath and gastrostomy tube placements. EXAM: 1. IMPLANTED PORT A CATH PLACEMENT WITH ULTRASOUND AND FLUOROSCOPIC GUIDANCE 2. Fluoroscopic guided nasogastric tube placement 3. Fluoroscopic guided percutaneous gastrostomy tube placement COMPARISON:  None Available. MEDICATIONS: Ancef 2 gm IV; The antibiotic was administered within an appropriate time interval prior to skin puncture. ANESTHESIA/SEDATION: Moderate (conscious) sedation was employed during this procedure. A total of Versed 4 mg and Fentanyl 200 mcg was administered intravenously. Moderate Sedation Time: 31 minutes. The patient's level of consciousness and vital signs were monitored continuously by radiology nursing throughout the procedure under my direct supervision. CONTRAST:  None FLUOROSCOPY TIME:  Eleven mGy COMPLICATIONS: None immediate. PROCEDURE: The procedure, risks, benefits, and alternatives were explained to the patient. Questions regarding the procedure were encouraged and answered. The patient understands and consents to the procedure. The right neck, chest, and upper abdomen were prepped with chlorhexidine in a sterile fashion, and a sterile drape was applied covering the operative field. Maximum barrier sterile technique with sterile gowns and gloves were used for the procedure. A timeout was performed prior to the initiation of the procedure. Ultrasound was used to examine the jugular vein which was compressible and free of internal echoes. A skin marker was used to demarcate the planned venotomy and port pocket incision sites. Local anesthesia was provided to these sites and the subcutaneous tunnel track with 1% lidocaine with 1:100,000 epinephrine. A small incision was created at the jugular access site and blunt dissection was performed of the subcutaneous tissues. Under ultrasound guidance, the jugular vein was accessed with a 21 ga  micropuncture needle and an 0.018" wire was inserted to the superior vena cava. Real-time ultrasound guidance was utilized for vascular access including the acquisition of a permanent ultrasound image documenting patency of the accessed vessel. A 5 Fr micopuncture set was then used, through which a 0.035" Rosen wire was passed under fluoroscopic guidance into the inferior vena cava. An 8 Fr dilator was then placed over the wire. A subcutaneous port pocket was then created along the upper chest wall utilizing a combination of sharp and blunt dissection. The pocket was irrigated with sterile saline, packed with gauze, and observed for hemorrhage. A single lumen "ISP" sized power injectable port was chosen for placement. The 8 Fr catheter was tunneled from the port pocket site to the venotomy incision. The port was placed in the pocket. The external catheter was trimmed to appropriate length. The dilator was exchanged for an 8 Fr peel-away sheath under fluoroscopic guidance. The catheter was then placed through the sheath and the sheath was removed. Final catheter positioning was confirmed and documented with a fluoroscopic spot radiograph. The port was accessed with a Huber needle, aspirated, and flushed with heparinized saline. The deep dermal layer of the port pocket incision was closed with interrupted 3-0 Vicryl suture. Dermabond was then placed over the port pocket and neck incisions. Attention was then turned toward placement of the gastrostomy tube. Pre-procedure abdominal film confirmed visualization of the transverse colon. An angled 5-French catheter was passed through the nares into the stomach. The patient  was prepped and draped in usual sterile fashion. The stomach was insufflated with air via the indwelling nasogastric tube. Under fluoroscopy, a puncture site was selected and local analgesia achieved with 1% lidocaine infiltrated subcutaneously. Under fluoroscopic guidance, a gastropexy needle was passed  into the stomach and the T-bar suture was released. Entry into the stomach was confirmed with fluoroscopy, aspiration of air, and injection of contrast material. This was repeated with an additional gastropexy suture (for a total of 2 fasteners). At the center of these gastropexy sutures, a dermatotomy was performed. An 18 gauge needle was passed into the stomach at the site of this dermatotomy, and position within the gastric lumen again confirmed under fluoroscopy using aspiration of air and contrast injection. An Amplatz guidewire was passed through this needle and intraluminal placement within the stomach was confirmed by fluoroscopy. The needle was removed. Over the guidewire, the percutaneous tract was dilated using a 10 mm non-compliant balloon. The balloon was deflated, then pushed into the gastric lumen followed in concert by the 20 Fr gastrostomy tube. The retention balloon of the percutaneous gastrostomy tube was inflated with 10 mL of sterile water. The tube was withdrawn until the retention balloon was at the edge of the gastric lumen. The external bumper was brought to the abdominal wall. Contrast was injected through the gastrostomy tube, confirming intraluminal positioning. The patient tolerated the procedures well without any immediate post-procedural complications. IMPRESSION: 1. Successful placement of a power injectable Port-A-Cath via the right internal jugular vein. The catheter is ready for immediate use. 2. Successful placement of 20 French balloon retention percutaneous gastrostomy tube. PLAN: The patient will be scheduled for 6 months routine gastrostomy tube exchange. Ruthann Cancer, MD [facility] Radiology Electronically Signed   By: Ruthann Cancer M.D.   On: 12/15/2022 11:01    ASSESSMENT & PLAN  RONIE SKILES is a 57 y.o. 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 Primary site  unclear at this time but is likely oropharyngeal given HPV positivity. Undergoing IMRT and weekly Cisplatin  PLAN: -discussed lab results from today, 12/17/2022, with the patient. CBC is stable. CMP is pending.  -Will prescribe Senna-S. -Recommended to continue to stay well-hydrated and eat well.  -Patient tolerated his cycle 1 of Cisplatin well without any toxicities.  -Patient can proceed with cycle 2 of Cisplatin without any dose modification.  -Answered all of patient's questions.    FOLLOW-UP: Per integrated scheduling.   The total time spent in the appointment was 23 minutes* .  All of the patient's questions were answered with apparent satisfaction. The patient knows to call the clinic with any problems, questions or concerns.   Sullivan Lone MD MS AAHIVMS [facility] Hematology/Oncology Physician [facility]  .*Total Encounter Time as defined by the Centers for Medicare and Medicaid Services includes, in addition to the face-to-face time of a patient visit (documented in the note above) non-face-to-face time: obtaining and reviewing outside history, ordering and reviewing medications, tests or procedures, care coordination (communications with other health care professionals or caregivers) and documentation in the medical record.   I, Cleda Mccreedy, am acting as a Education administrator for Sullivan Lone, MD. .I have reviewed the above documentation for accuracy and completeness, and I agree with the above. Brunetta Genera MD

## 2022-12-17 NOTE — Therapy (Signed)
OUTPATIENT PHYSICAL THERAPY HEAD AND NECK BASELINE EVALUATION   Patient Name: Jonathan Hudson MRN: [id number] DOB:10/09/1966, 57 y.o., male Today's Date: 12/18/2022  END OF SESSION:  PT End of Session - 12/18/22 1038     Visit Number 1    Number of Visits 2    Date for PT Re-Evaluation 02/26/23    PT Start Time 0935    PT Stop Time 1037    PT Time Calculation (min) 62 min    Activity Tolerance Patient tolerated treatment well    Behavior During Therapy [facility] for tasks assessed/performed             Past Medical History:  Diagnosis Date   Boils 01/01/2017   Hypercholesteremia    Hypertension    Past Surgical History:  Procedure Laterality Date   COLONOSCOPY N/A 01/29/2017   Procedure: COLONOSCOPY;  Surgeon: Rogene Houston, MD;  [facility];  Service: Endoscopy;  Laterality: N/A;  730   COLONOSCOPY WITH PROPOFOL N/A 04/03/2022   Procedure: COLONOSCOPY WITH PROPOFOL;  Surgeon: Rogene Houston, MD;  Location[facility];  Service: Endoscopy;  Laterality: N/A;  730   cyst removed from left wrist      EYE SURGERY     grafts N/A 10  years  ago   grafts to gums   HEMOSTASIS CLIP PLACEMENT  04/03/2022   Procedure: HEMOSTASIS CLIP PLACEMENT;  Surgeon: Rogene Houston, MD;  [facility];  Service: Endoscopy;;   IR GASTROSTOMY TUBE MOD SED  12/15/2022   IR IMAGING GUIDED PORT INSERTION  12/15/2022   IR NASO G TUBE PLC W/FL W/RAD  12/15/2022   OPEN REDUCTION INTERNAL FIXATION (ORIF) DISTAL RADIAL FRACTURE Right 12/28/2013   Procedure: OPEN REDUCTION INTERNAL FIXATION (ORIF) DISTAL RADIAL FRACTURE;  Surgeon: Carole Civil, MD;  Location: [facility];  Service: Orthopedics;  Laterality: Right;   ORIF ULNAR FRACTURE Right 12/28/2013   Procedure: OPEN REDUCTION INTERNAL FIXATION (ORIF) ULNAR FRACTURE;  Surgeon: Carole Civil, MD;  Location: [facility];  Service: Orthopedics;  Laterality: Right;   POLYPECTOMY  04/03/2022   Procedure: POLYPECTOMY;  Surgeon:  Rogene Houston, MD;  [facility];  Service: Endoscopy;;   right knee Right teenager   right knee    Patient Active Problem List   Diagnosis Date Noted   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[place]) 11/18/2022   Lymphadenopathy 10/13/2022   Special screening for malignant neoplasms, colon 10/23/2016   Family hx of colon cancer 10/23/2016   Range of motion deficit 03/28/2014   Dehiscence of operative wound 01/24/2014   Fracture of forearm, closed 01/24/2014   Closed fracture of shaft of right radius and ulna 12/28/2013   Forearm fracture 12/28/2013   Fx radius/ulna shaft-closed 12/28/2013    PCP: Celene Squibb, MD  REFERRING PROVIDER: Eppie Gibson, MD  REFERRING DIAG: C09.0 (ICD-10-CM) - 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  I get winded when I walk but it doesn't happen why I am resting. I get short of breath in the shower.   PERTINENT HISTORY:  Metastatic SCC of the neck with left tonsil primary PET findings, stage I (T1N1M0) He presented to urgent  care on 09/04/22 with a large painful swollen lump to the left side of his neck that appeared the day before. He was treated with abx and anti-inflammatory management. He followed up with his PCP on 09/23/23 who ordered a head and neck US which revealed a hypoechoic solid mass in the left neck measuring 4.7 x 2.7 cm. 10/06/22 CT neck further revealed bulky and necrotic adenopathy in the left level 2 neck consisting of an enlarged left jugulodigastic node measuring 5.1 cm. 10/13/22 He was referred to the Diagnostic clinic at [place]. She ordered biopsy/ENT referral/PET. 10/21/22 Biopsy of left neck node  revealed metastatic SCC, p 16 + .11/13/22 PET revealed asymmetric thickening along the left oropharynx with intense hypermetabolic activity in the left palatine tonsil, compatible with primary neoplasm; the hypermetabolic centrally necrotic left jugulodigastric level 2 lymph node consistent with nodal disease; and nonspecific sublingual hypermetabolic activity in the anterior mouth possibly reflecting physiologic activity. PET otherwise shows no evidence of hypermetabolic metastatic disease in the chest, abdomen or pelvis. Consult with Dr. Isidore Moos on 11/18/22 & Dr. Irene Limbo on 10/30/22 & 11/17/22. Due to PET scan showing primary, surgery with Dr. Constance Holster was cancelled and he will proceed with chemo/radiation treatment.  He will receive 35 fractions of radiation to his left tonsil and bilateral neck for curative intent. He started on 12/08/22 and will complete 01/23/23. Teeth extraction by Dr. Frederik Schmidt. PEG/PAC 12/15/22  PATIENT GOALS:   to be educated about the signs and symptoms of lymphedema and learn post op HEP.   PAIN:  Are you having pain? No  PRECAUTIONS: Active CA  WEIGHT BEARING RESTRICTIONS: No  FALLS:  Has patient fallen in last 6 months? No Does the patient have a fear of falling that limits activity? 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, has to lift boxes and paper ~10lbs  LEISURE: was going to the senior center and helping, likes to be active, walking, playing basketball   PRIOR LEVEL OF FUNCTION: Independent   OBJECTIVE:  COGNITION: Overall cognitive status: Within functional limits for tasks assessed                  POSTURE:  Forward head and rounded shoulders posture  30 SEC SIT TO STAND: 15 reps in 30 sec without use of UEs which is   Below average for patient's age  SHOULDER AROM:   WFL   CERVICAL AROM:   Percent limited  Flexion WFL  Extension WFL  Right lateral flexion WFL  Left lateral flexion WFL  Right rotation WFL  Left rotation WFL    (Blank rows=not tested)  GAIT: Assessed: Yes Assistance needed: Independent Ambulation Distance: 150 feet Assistive Device: none Gait pattern: WFL Ambulation surface: Level  PATIENT EDUCATION:  Education details: Neck ROM, importance of posture when sitting, standing and lying down, deep breathing, walking program and importance of staying active throughout treatment, CURE article on staying active, "Why exercise?" flyer, lymphedema and PT info Person educated: Patient Education method: Explanation, Demonstration, Handout Education comprehension: Patient verbalized understanding and returned demonstration  HOME EXERCISE PROGRAM: Patient was instructed today in a home exercise program today for head and neck range of motion exercises. These included active cervical flexion, active cervical extension, active cervical rotation to each  direction, upper trap stretch, and shoulder retraction. Patient was encouraged to do these 2-3 times a day, holding for 5 sec each and completing for 5 reps. Pt was educated that once this becomes easier then hold the stretches for 30-60 seconds.    ASSESSMENT:  CLINICAL IMPRESSION: Pt arrives to PT with recently diagnosed L tonsillar cancer. Pt will undergo chemo/radiation treatment. He will receive 35 fractions of radiation to his left tonsil and bilateral neck for curative intent. He started on 12/08/22 and will complete 01/23/23.   Pt's cervical ROM was [facility]. Educated pt about signs and symptoms of lymphedema as well as anatomy and physiology of lymphatic system. Educated pt in importance of staying as active as possible throughout treatment to decrease fatigue as well as head and neck ROM exercises to decrease loss of ROM. Will see pt after  completion of radiation to reassess ROM and assess for lymphedema and to determine therapy needs at that time.  Pt will benefit from skilled therapeutic intervention to improve on the following deficits: Decreased knowledge of precautions and postural dysfunction.   PT treatment/interventions: ADL/self-care home management, pt/family education, therapeutic exercise. Other interventions   REHAB POTENTIAL: Good  CLINICAL DECISION MAKING: Stable/uncomplicated  EVALUATION COMPLEXITY: Low   GOALS: Goals reviewed with patient? YES  LONG TERM GOALS: (STG=LTG)   Name Target Date  Goal status  1 Patient will be able to verbalize understanding of a home exercise program for cervical range of motion, posture, and walking.   Baseline:  No knowledge 12/18/2022 Achieved at eval  2 Patient will be able to verbalize understanding of proper sitting and standing posture. Baseline:  No knowledge 12/18/2022 Achieved at eval  3 Patient will be able to verbalize understanding of lymphedema risk and availability of treatment for this condition Baseline:  No knowledge 12/18/2022 Achieved at eval  4 Pt will demonstrate a return to full cervical ROM and function post operatively compared to baselines and not demonstrate any signs or symptoms of lymphedema.  Baseline: See objective measurements taken today. 02/26/23 New    PLAN:  PT FREQUENCY/DURATION: EVAL and 1 follow up appointment.   PLAN FOR NEXT SESSION: will reassess 2 weeks after completion of radiation to determine needs.  Patient will follow up at outpatient cancer rehab 2 weeks after completion of radiation.  If the patient requires physical therapy at that time, a specific plan will be dictated and sent to the referring physician for approval. The patient was educated today on appropriate basic range of motion exercises to begin now and continue throughout radiation and educated on the signs and symptoms of lymphedema. Patient verbalized good  understanding.     Physical Therapy Information for During and After Head/Neck Cancer Treatment: Lymphedema is a swelling condition that you may be at risk for in your neck and/or face if you have radiation treatment to the area and/or if you have surgery that includes removing lymph nodes.  There is treatment available for this condition and it is not life-threatening.  Contact your physician or physical therapist with concerns. An excellent resource for those seeking information on lymphedema is the National Lymphedema Network's website.  It can be accessed at [place].org If you notice swelling in your neck or face at any time following surgery (even if it is many years from now), please contact your doctor or physical therapist to discuss this.  Lymphedema can be treated at any time but it is easier for you if it is treated early on. If you  have had surgery to your neck, please check with your surgeon about how soon to start doing neck range of motion exercises.  If you are not having surgery, I encourage you to start doing neck range of motion exercises today and continue these while undergoing treatment, UNLESS you have irritation of your skin or soft tissue that is aggravated by doing them.  These exercises are intended to help you prevent loss of range of motion and/or to gain range of motion in your neck (which can be limited by tightening effects of radiation), and NOT to aggravate these tissues if they develop sensitivities from treatment. Neck range of motion exercises should be done to the point of feeling a GENTLE, TOLERABLE stretch only.  You are encouraged to start a walking or other exercise program tomorrow and continue this as much as you are able through and after treatment.  Please feel free to call me with any questions. Manus Gunning, PT, CLT Physical Therapist and Certified Lymphedema Therapist [facility] [address], [address], [place],  [place] [address] [phone] [email or web address]  WALKING  Walking is a great form of exercise to increase your strength, endurance and overall fitness.  A walking program can help you start slowly and gradually build endurance as you go.  Everyone's ability is different, so each person's starting point will be different.  You do not have to follow them exactly.  The are just samples. You should simply find out what's right for you and stick to that program.   In the beginning, you'll start off walking 2-3 times a day for short distances.  As you get stronger, you'll be walking further at just 1-2 times per day.  A. You Can Walk For A Certain Length Of Time Each Day    Walk 5 minutes 3 times per day.  Increase 2 minutes every 2 days (3 times per day).  Work up to 25-30 minutes (1-2 times per day).   Example:   Day 1-2 5 minutes 3 times per day   Day 7-8 12 minutes 2-3 times per day   Day 13-14 25 minutes 1-2 times per day  B. You Can Walk For a Certain Distance Each Day     Distance can be substituted for time.    Example:   3 trips to mailbox (at road)   3 trips to corner of block   3 trips around the block  C. Go to local high school and use the track.    Walk for distance ____ around track  Or time ____ minutes  D. Walk ____ Jog ____ Run ___   Why exercise?  So many benefits! Here are SOME of them: Heart health, including raising your good cholesterol level and reducing heart rate and blood pressure Lung health, including improved lung capacity It burns fats, and most of [place] can stand to be leaner, whether or not we are overweight. It increases the body's natural painkillers and mood elevators, so makes you feel better. Not only makes you feel better, but look better too Improves sleep Takes a bite out of stress May decrease your risk of many types of cancer If you are currently undergoing cancer treatment, exercise may improve your ability to tolerate  treatments including chemotherapy. For everybody, it can improve your energy level. Those with cancer-related fatigue report a 40-50% reduction in this symptom when exercising regularly. If you are a survivor of breast, colon, or prostate cancer, it may decrease your risk  of a recurrence. (This may hold for other cancers too, but so far we have data just for these three types.)  How to exercise: Get your doctor's okay. Pick something you enjoy doing, like walking, Zumba, biking, swimming, or whatever. Start at low intensity and time, then gradually increase.  (See walking program handout.) Set a goal to achieve over time.  The American Cancer Society, American Heart Association, and U.S. Dept. of Health and Human Services recommend 150 minutes of moderate exercise, 75 minutes of vigorous exercise, or a combination of both per week. This should be done in episodes at least 10 minutes long, spread throughout the week.  Need help being motivated? Pick something you enjoy doing, because you'll be more inclined to stick with that activity than something that feels like a chore. Do it with a friend so that you are accountable to each other. Schedule it into your day. Place it on your calendar and keep that appointment just like you do any appointment that you make. Join an exercise group that meets at a specific time.  That way, you have to show up on time, and that makes it harder to procrastinate about doing your workout.  It also keeps you accountable--people begin to expect you to be there. Join a gym where you feel comfortable and not intimidated, at the right cost. Sign up for something that you'll need to be in shape for on a specific date, like a 1K or a 5K to walk or run, a 20 or 30 mile bike ride, a mud run or something like that. If the date is looming, you know you'll need to train to be ready for it.  An added benefit is that many of these are fundraisers for good causes. If you've already  paid for a gym membership, group exercise class or event, you might as well work out, so you haven't wasted your money!    [facility], PT 12/18/2022, 10:54 AM

## 2022-12-17 NOTE — Progress Notes (Signed)
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 He is receiving concurrent chemoradiation with weekly cisplatin. S/p PEG on 2/12  Met with patient and wife during infusion. He reports lingering abdominal soreness s/p PEG but this is improving. Patient taking tylenol for pain as needed. Patient endorses good appetite and increasing oral intake. Recalls 2 egg sandwiches, big bowl of chicken noodle soup, 2 fish filet sandwiches, strawberry frappe, pie yesterday. Patient reports large bowl of oatmeal, [place] sandwich, trail mix, and chicken pasta today. He is drinking ~80 ounces of water. Patient is flushing tube with 60 ml water daily. He is hoping to eat/drink well enough during treatment to not use tube. He denies sore throat, thick saliva, dry mouth, nausea, vomiting, diarrhea. Patient reports mild constipation. It has been one day since last bowel movement. He will start daily regimen per MD.   Medications: reviewed   Labs: reviewed   Anthropometrics: Wt 247 lb 9.6 oz today increased   1/29 - 245 lb 8 oz   NUTRITION DIAGNOSIS: Food and nutrition related knowledge deficit   INTERVENTION:  Continue regular diet as able, recommend protein source with all meals and snacks PEG in place - continue flushing with 60 ml daily. Will defer completing bolus feeding education as pt ate large lunch prior + currently tolerating oral intake without difficulty Bowel regimen per MD Contact information provided     MONITORING, EVALUATION, GOAL: weight trends, intake   NEXT VISIT: Wednesday February 21 during infusion

## 2022-12-17 NOTE — Patient Instructions (Signed)
[facility]  Discharge Instructions: Thank you for choosing [facility] to provide your oncology and hematology care.   If you have a lab appointment with the [facility], please go directly to the [facility] and check in at the registration area.   Wear comfortable clothing and clothing appropriate for easy access to any Portacath or PICC line.   We strive to give you quality time with your provider. You may need to reschedule your appointment if you arrive late (15 or more minutes).  Arriving late affects you and other patients whose appointments are after yours.  Also, if you miss three or more appointments without notifying the office, you may be dismissed from the clinic at the provider's discretion.      For prescription refill requests, have your pharmacy contact our office and allow 72 hours for refills to be completed.    Today you received the following chemotherapy and/or immunotherapy agents cisplatin      To help prevent nausea and vomiting after your treatment, we encourage you to take your nausea medication as directed.  BELOW ARE SYMPTOMS THAT SHOULD BE REPORTED IMMEDIATELY: *FEVER GREATER THAN 100.4 F (38 C) OR HIGHER *CHILLS OR SWEATING *NAUSEA AND VOMITING THAT IS NOT CONTROLLED WITH YOUR NAUSEA MEDICATION *UNUSUAL SHORTNESS OF BREATH *UNUSUAL BRUISING OR BLEEDING *URINARY PROBLEMS (pain or burning when urinating, or frequent urination) *BOWEL PROBLEMS (unusual diarrhea, constipation, pain near the anus) TENDERNESS IN MOUTH AND THROAT WITH OR WITHOUT PRESENCE OF ULCERS (sore throat, sores in mouth, or a toothache) UNUSUAL RASH, SWELLING OR PAIN  UNUSUAL VAGINAL DISCHARGE OR ITCHING   Items with * indicate a potential emergency and should be followed up as soon as possible or go to the Emergency Department if any problems should occur.  Please show the CHEMOTHERAPY ALERT CARD or IMMUNOTHERAPY ALERT CARD at  check-in to the Emergency Department and triage nurse.  Should you have questions after your visit or need to cancel or reschedule your appointment, please contact [facility]  Dept: [phone]  and follow the prompts.  Office hours are 8:00 a.m. to 4:30 p.m. Monday - Friday. Please note that voicemails left after 4:00 p.m. may not be returned until the following business day.  We are closed weekends and major holidays. You have access to a nurse at all times for urgent questions. Please call the main number to the clinic Dept: [phone] and follow the prompts.   For any non-urgent questions, you may also contact your provider using MyChart. We now offer e-Visits for anyone 40 and older to request care online for non-urgent symptoms. For details visit [email or web address].   Also download the MyChart app! Go to the app store, search "MyChart", open the app, select [facility], and log in with your MyChart username and password.

## 2022-12-17 NOTE — Progress Notes (Unsigned)
Jonathan Hudson

## 2022-12-18 ENCOUNTER — Encounter: Payer: Self-pay | Admitting: Physical Therapy

## 2022-12-18 ENCOUNTER — Ambulatory Visit: Payer: 59 | Attending: Radiation Oncology | Admitting: Physical Therapy

## 2022-12-18 ENCOUNTER — Ambulatory Visit: Payer: 59 | Attending: Radiation Oncology

## 2022-12-18 ENCOUNTER — Ambulatory Visit
Admission: RE | Admit: 2022-12-18 | Discharge: 2022-12-18 | Disposition: A | Discharge status: Home / Self Care | Payer: 59 | Source: Ambulatory Visit | Attending: Radiation Oncology | Admitting: Radiation Oncology

## 2022-12-18 ENCOUNTER — Other Ambulatory Visit: Payer: Self-pay

## 2022-12-18 DIAGNOSIS — C090 Malignant neoplasm of tonsillar fossa: Secondary | ICD-10-CM | POA: Insufficient documentation

## 2022-12-18 DIAGNOSIS — Z5111 Encounter for antineoplastic chemotherapy: Secondary | ICD-10-CM | POA: Diagnosis not present

## 2022-12-18 DIAGNOSIS — R293 Abnormal posture: Secondary | ICD-10-CM | POA: Diagnosis present

## 2022-12-18 DIAGNOSIS — R1310 Dysphagia, unspecified: Secondary | ICD-10-CM | POA: Insufficient documentation

## 2022-12-18 LAB — RAD ONC ARIA SESSION SUMMARY
Course Elapsed Days: 10
Plan Fractions Treated to Date: 9
Plan Prescribed Dose Per Fraction: 2 Gy
Plan Total Fractions Prescribed: 35
Plan Total Prescribed Dose: 70 Gy
Reference Point Dosage Given to Date: 18 Gy
Reference Point Session Dosage Given: 2 Gy
Session Number: 9

## 2022-12-18 NOTE — Therapy (Signed)
OUTPATIENT SPEECH LANGUAGE PATHOLOGY ONCOLOGY EVALUATION   Patient Name: Jonathan Hudson MRN: [id number] DOB:02-04-1966, 57 y.o., male Today's Date: 12/18/2022  PCP: Celene Squibb, MD REFERRING PROVIDER: Eppie Gibson, MD  END OF SESSION:  End of Session - 12/18/22 1042     Visit Number 1    Number of Visits 7    Date for SLP Re-Evaluation 03/18/23    SLP Start Time 51    SLP Stop Time  1115    SLP Time Calculation (min) 35 min    Activity Tolerance Patient tolerated treatment well             Past Medical History:  Diagnosis Date   Boils 01/01/2017   Hypercholesteremia    Hypertension    Past Surgical History:  Procedure Laterality Date   COLONOSCOPY N/A 01/29/2017   Procedure: COLONOSCOPY;  Surgeon: Rogene Houston, MD;  [facility]: [facility];  Service: Endoscopy;  Laterality: N/A;  730   COLONOSCOPY WITH PROPOFOL N/A 04/03/2022   Procedure: COLONOSCOPY WITH PROPOFOL;  Surgeon: Rogene Houston, MD;  Location: [facility];  Service: Endoscopy;  Laterality: N/A;  730   cyst removed from left wrist      EYE SURGERY     grafts N/A 10  years  ago   grafts to gums   HEMOSTASIS CLIP PLACEMENT  04/03/2022   Procedure: HEMOSTASIS CLIP PLACEMENT;  Surgeon: Rogene Houston, MD;  Location: [facility];  Service: Endoscopy;;   IR GASTROSTOMY TUBE MOD SED  12/15/2022   IR IMAGING GUIDED PORT INSERTION  12/15/2022   IR NASO G TUBE PLC W/FL W/RAD  12/15/2022   OPEN REDUCTION INTERNAL FIXATION (ORIF) DISTAL RADIAL FRACTURE Right 12/28/2013   Procedure: OPEN REDUCTION INTERNAL FIXATION (ORIF) DISTAL RADIAL FRACTURE;  Surgeon: Carole Civil, MD;  Location: [facility];  Service: Orthopedics;  Laterality: Right;   ORIF ULNAR FRACTURE Right 12/28/2013   Procedure: OPEN REDUCTION INTERNAL FIXATION (ORIF) ULNAR FRACTURE;  Surgeon: Carole Civil, MD;  Location: [facility];  Service: Orthopedics;  Laterality: Right;   POLYPECTOMY  04/03/2022   Procedure: POLYPECTOMY;  Surgeon:  Rogene Houston, MD;  Location: [facility];  Service: Endoscopy;;   right knee Right teenager   right knee    Patient Active Problem List   Diagnosis Date Noted   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[place]) 11/18/2022   Lymphadenopathy 10/13/2022   Special screening for malignant neoplasms, colon 10/23/2016   Family hx of colon cancer 10/23/2016   Range of motion deficit 03/28/2014   Dehiscence of operative wound 01/24/2014   Fracture of forearm, closed 01/24/2014   Closed fracture of shaft of right radius and ulna 12/28/2013   Forearm fracture 12/28/2013   Fx radius/ulna shaft-closed 12/28/2013    ONSET DATE: Oct-Nov 2023   REFERRING DIAG: Carcinoma of tonsillar fossa  THERAPY DIAG:  Dysphagia, unspecified type  Rationale for Evaluation and Treatment: Rehabilitation  SUBJECTIVE:   SUBJECTIVE STATEMENT: Pt denies  Pt accompanied by: self  PERTINENT HISTORY:  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 He was treated with abx and anti-inflammatory management. He followed up with his PCP on 09/23/23 who ordered a head and neck US which revealed a hypoechoic solid mass in the left neck measuring 4.7 x 2.7 cm. 10/06/22 CT  neck further revealed bulky and necrotic adenopathy in the left level 2 neck consisting of an enlarged left jugulodigastic node measuring 5.1 cm.  10/13/22 He was referred to the Diagnostic clinic at [place]. She ordered biopsy/ENT referral/PET. 10/21/22 Biopsy of left neck node revealed metastatic SCC, p 16 + .11/12/22 He met with Dr. Constance Holster who at the time planned biopsies with anesthesia. 11/13/22 PET revealed asymmetric thickening along the left oropharynx with intense hypermetabolic activity in the left palatine tonsil, compatible with primary neoplasm; the hypermetabolic  centrally necrotic left jugulodigastric level 2 lymph node consistent with nodal disease; and nonspecific sublingual hypermetabolic activity in the anterior mouth possibly reflecting physiologic activity. PET otherwise shows no evidence of hypermetabolic metastatic disease in the chest, abdomen or pelvis. Consult with Dr. Isidore Moos on 11/18/22 & Dr. Irene Limbo on 10/30/22 & 11/17/22. Due to PET scan showing primary, surgery with Dr. Constance Holster was cancelled and he will proceed with chemo/radiation treatment. Treatment plan:  He will receive 35 fractions of radiation to his left tonsil and bilateral neck for curative intent. He started on 12/08/22 and will complete 01/23/23. PEG/PAC 12/15/22  PAIN:  Are you having pain? Yes: NPRS scale: 2/10 Pain location: PEG site Pain description: soreness Aggravating factors: standing up Relieving factors: not moving  FALLS: Has patient fallen in last 6 months?  See PT evaluation for details  LIVING ENVIRONMENT: Lives with: lives with an adult companion Lives in: House/apartment  PLOF:  Level of assistance: Independent with ADLs, Independent with IADLs Employment: Part-time employment  PATIENT GOALS: Swallowing remain normal  OBJECTIVE:   COGNITION: Overall cognitive status: Within functional limits for tasks assessed  LANGUAGE: Receptive and Expressive language appeared WNL.  ORAL MOTOR EXAMINATION: Overall status: WFL  MOTOR SPEECH: Overall motor speech: Appears intact  CLINICAL SWALLOW ASSESSMENT:   Current diet: regular, Dysphagia 3 (mechanical soft), and thin liquids Dentition:  natural dentition - pt's molars were extracted as a preventative measure Patient directly observed with POs: Yes: regular, dysphagia 3 (soft), and thin liquids  Feeding: able to feed self Liquids provided by: straw Oral phase signs and symptoms:  none observed today Pharyngeal phase signs and symptoms:  none observed today   TODAY'S TREATMENT:                                                                                                                                          DATE:  12/18/22 (eval): Research states the risk for dysphagia increases due to radiation and/or chemotherapy treatment due to a variety of factors, so SLP educated the pt about the possibility of reduced/limited ability for PO intake during rad tx. SLP also educated pt regarding possible changes to swallowing musculature after rad tx, and why adherence to dysphagia HEP provided today and PO consumption was necessary to inhibit muscle fibrosis following rad tx and to mitigate muscle disuse atrophy. SLP informed  pt why this would be detrimental to their swallowing status and to their pulmonary health. Pt demonstrated understanding of these things to SLP. SLP encouraged pt to safely eat and drink as deep into their radiation/chemotherapy as possible to provide the best possible long-term swallowing outcome for pt.  SLP then developed an individualized HEP for pt involving oral and pharyngeal strengthening and ROM and pt was instructed how to perform these exercises, including SLP demonstration. After SLP demonstration, pt return demonstrated each exercise. SLP ensured pt performance was correct prior to educating pt on next exercise. Pt required rare min cues faded to modified independent to perform HEP. Pt was instructed to complete this program 6-7 days/week, at least 2 times a day until 6 months after his or her last day of rad tx, and then x2 a week after that, indefinitely. Among other modifications for days when pt cannot functionally swallow, SLP also suggested pt to cycle through the swallowing portion so the full program of exercises can be completed instead of fatiguing on one of the swallowing exercises and being unable to perform the other swallowing exercises. SLP instructed that swallowing exercises should then be added back into the regimen as pt is able to do so. Secondly, pt was told that former  patients have told SLP that during their course of radiation therapy, taking prescribed pain medication just prior to performing HEP (and eating/drinking) has proven helpful in completing HEP (and eating and drinking) more regularly when going through their course of radiation treatment.    PATIENT EDUCATION: Education details: late effects head/neck radiation on swallow function, HEP procedure, and modification to HEP when difficulty experienced with swallowing during and after radiation course Person educated: Patient and Spouse Education method: Explanation, Demonstration, Verbal cues, and Handouts Education comprehension: verbalized understanding, returned demonstration, verbal cues required, and needs further education   ASSESSMENT:  CLINICAL IMPRESSION: Patient is a 57 y.o. male who was seen today for assessment of swallowing as they undergo radiation/chemoradiation therapy. Today pt ate items from Regular and Dys III diet and drank thin liquids without overt s/s oral or pharyngeal difficulty. At this time pt swallowing is deemed WNL/WFL with these POs. No oral or overt s/sx pharyngeal deficits, including aspiration were observed. There are no overt s/s aspiration PNA observed by SLP nor any reported by pt at this time. Data indicate that pt's swallow ability will likely decrease over the course of radiation/chemoradiation therapy and could very well decline over time following the conclusion of that therapy due to muscle disuse atrophy and/or muscle fibrosis. Pt will cont to need to be seen by SLP in order to assess safety of PO intake, assess the need for recommending any objective swallow assessment, and ensuring pt is correctly completing the individualized HEP.  OBJECTIVE IMPAIRMENTS: include dysphagia. These impairments are limiting patient from safety when swallowing. Factors affecting potential to achieve goals and functional outcome are none at this time. Patient will benefit from  skilled SLP services to address above impairments and improve overall function.  REHAB POTENTIAL: Excellent   GOALS: Goals reviewed with patient? No SHORT TERM GOALS: Target: 3rd total session   1. Pt will compelte HEP with modified independence in 2 sessions Baseline: Goal status: INITIAL   2.  pt will tell SLP why pt is completing HEP with modified independence Baseline:  Goal status: INITIAL   3.  pt will describe 3 overt s/s aspiration PNA with modified independence Baseline:  Goal status: INITIAL   4.  pt  will tell SLP how a food journal could hasten return to a more normalized diet Baseline:  Goal status: INITIAL     LONG TERM GOALS: Target: 7th total session   1.  pt will complete HEP with independence over two visits Baseline:  Goal status: INITIAL   2.  pt will describe how to modify HEP over time, and the timeline associated with reduction in HEP frequency with modified independence over two sessions Baseline:  Goal status: INITIAL   PLAN:  SLP FREQUENCY: once approx every 4 weeks  SLP DURATION: 7 sessions  PLANNED INTERVENTIONS: Aspiration precaution training, Pharyngeal strengthening exercises, Diet toleration management , Trials of upgraded texture/liquids, SLP instruction and feedback, Compensatory strategies, and Patient/family education  [facility], [place] 12/18/2022, 11:20 AM

## 2022-12-18 NOTE — Patient Instructions (Signed)
SWALLOWING EXERCISES Do these until 6 months after your last day of radiation, then 2-3 times per week afterwards  Effortful Swallows - Press your tongue against the roof of your mouth for 3 seconds, then squeeze the muscles in your neck while you swallow your saliva or a sip of water - Repeat 10-15 times, 2-3 times a day, and use whenever you eat or drink  [place] Swallow - swallow with your tongue sticking out - Stick tongue out past your lips and gently bite tongue with your teeth - Swallow, while holding your tongue with your teeth - Repeat 10-15 times, 2-3 times a day *use a wet spoon if your mouth gets dry*   Shaker Exercise - head lift - Lie flat on your back in your bed or on a couch without pillows - Raise your head and look at your feet - KEEP YOUR SHOULDERS DOWN - HOLD FOR 45-60 SECONDS, then lower your head back down - Repeat 3 times, 2-3 times a day  [employer] - "half swallow" exercise - Start to swallow, and keep your Adam's apple up by squeezing hard with the muscles of the throat - Hold the squeeze for 5-7 seconds and then relax - Repeat 10-15 times, 2-3 times a day *use a wet spoon if your mouth gets dry*           5    "Super Swallow"  - Take a breath and hold it  - Bear down (like pushing your bowels)  - Swallow then IMMEDIATELY cough  - Repeat 10 times, 2-3 times a day

## 2022-12-18 NOTE — Progress Notes (Signed)
Oncology Nurse Navigator Documentation   I met with Mr. Stollings after his radiation and before head and neck MDC today. He is doing well and has no concerns at this time. He knows to call me if I can help him with anything.   Harlow Asa RN, BSN, OCN Head & Neck Oncology Nurse [place] at [facility] Phone # [phone]  Fax # [phone]

## 2022-12-19 ENCOUNTER — Other Ambulatory Visit: Payer: Self-pay

## 2022-12-19 ENCOUNTER — Ambulatory Visit
Admission: RE | Admit: 2022-12-19 | Discharge: 2022-12-19 | Disposition: A | Discharge status: Home / Self Care | Payer: 59 | Source: Ambulatory Visit | Attending: Radiation Oncology | Admitting: Radiation Oncology

## 2022-12-19 DIAGNOSIS — Z5111 Encounter for antineoplastic chemotherapy: Secondary | ICD-10-CM | POA: Diagnosis not present

## 2022-12-19 LAB — RAD ONC ARIA SESSION SUMMARY
Course Elapsed Days: 11
Plan Fractions Treated to Date: 10
Plan Prescribed Dose Per Fraction: 2 Gy
Plan Total Fractions Prescribed: 35
Plan Total Prescribed Dose: 70 Gy
Reference Point Dosage Given to Date: 20 Gy
Reference Point Session Dosage Given: 2 Gy
Session Number: 10

## 2022-12-22 ENCOUNTER — Other Ambulatory Visit: Payer: Self-pay

## 2022-12-22 ENCOUNTER — Emergency Department (HOSPITAL_COMMUNITY)
Admission: EM | Admit: 2022-12-22 | Discharge: 2022-12-23 | Disposition: A | Discharge status: Home / Self Care | Payer: 59 | Attending: Emergency Medicine | Admitting: Emergency Medicine

## 2022-12-22 ENCOUNTER — Inpatient Hospital Stay: Payer: 59

## 2022-12-22 ENCOUNTER — Ambulatory Visit
Admission: RE | Admit: 2022-12-22 | Discharge: 2022-12-22 | Disposition: A | Discharge status: Home / Self Care | Payer: 59 | Source: Ambulatory Visit | Attending: Radiation Oncology | Admitting: Radiation Oncology

## 2022-12-22 ENCOUNTER — Inpatient Hospital Stay (HOSPITAL_BASED_OUTPATIENT_CLINIC_OR_DEPARTMENT_OTHER): Payer: 59 | Admitting: Physician Assistant

## 2022-12-22 ENCOUNTER — Emergency Department (HOSPITAL_COMMUNITY): Payer: 59

## 2022-12-22 ENCOUNTER — Encounter (HOSPITAL_COMMUNITY): Payer: Self-pay | Admitting: Emergency Medicine

## 2022-12-22 DIAGNOSIS — I10 Essential (primary) hypertension: Secondary | ICD-10-CM | POA: Insufficient documentation

## 2022-12-22 DIAGNOSIS — R0602 Shortness of breath: Secondary | ICD-10-CM | POA: Insufficient documentation

## 2022-12-22 DIAGNOSIS — Z1152 Encounter for screening for COVID-19: Secondary | ICD-10-CM | POA: Diagnosis not present

## 2022-12-22 DIAGNOSIS — C099 Malignant neoplasm of tonsil, unspecified: Secondary | ICD-10-CM | POA: Diagnosis not present

## 2022-12-22 DIAGNOSIS — Z79899 Other long term (current) drug therapy: Secondary | ICD-10-CM | POA: Insufficient documentation

## 2022-12-22 DIAGNOSIS — C090 Malignant neoplasm of tonsillar fossa: Secondary | ICD-10-CM

## 2022-12-22 HISTORY — DX: Malignant neoplasm of tonsil, unspecified: C09.9

## 2022-12-22 LAB — CMP (CANCER CENTER ONLY)
ALT: 25 U/L (ref 0–44)
AST: 15 U/L (ref 15–41)
Albumin: 3.7 g/dL (ref 3.5–5.0)
Alkaline Phosphatase: 66 U/L (ref 38–126)
Anion gap: 5 (ref 5–15)
BUN: 17 mg/dL (ref 6–20)
CO2: 31 mmol/L (ref 22–32)
Calcium: 8.8 mg/dL — ABNORMAL LOW (ref 8.9–10.3)
Chloride: 99 mmol/L (ref 98–111)
Creatinine: 0.88 mg/dL (ref 0.61–1.24)
GFR, Estimated: >60 mL/min (ref >60)
Glucose, Bld: 107 mg/dL — ABNORMAL HIGH (ref 70–99)
Potassium: 4.1 mmol/L (ref 3.5–5.1)
Sodium: 135 mmol/L (ref 135–145)
Total Bilirubin: 0.5 mg/dL (ref 0.3–1.2)
Total Protein: 6.9 g/dL (ref 6.5–8.1)

## 2022-12-22 LAB — RAD ONC ARIA SESSION SUMMARY
Course Elapsed Days: 14
Plan Fractions Treated to Date: 11
Plan Prescribed Dose Per Fraction: 2 Gy
Plan Total Fractions Prescribed: 35
Plan Total Prescribed Dose: 70 Gy
Reference Point Dosage Given to Date: 22 Gy
Reference Point Session Dosage Given: 2 Gy
Session Number: 11

## 2022-12-22 LAB — CBC WITH DIFFERENTIAL (CANCER CENTER ONLY)
Abs Immature Granulocytes: 0.06 10*3/uL (ref 0.00–0.07)
Basophils Absolute: 0 10*3/uL (ref 0.0–0.1)
Basophils Relative: 0 %
Eosinophils Absolute: 0.1 10*3/uL (ref 0.0–0.5)
Eosinophils Relative: 1 %
HCT: 42.4 % (ref 39.0–52.0)
Hemoglobin: 14.9 g/dL (ref 13.0–17.0)
Immature Granulocytes: 0 %
Lymphocytes Relative: 12 %
Lymphs Abs: 1.7 10*3/uL (ref 0.7–4.0)
MCH: 31.2 pg (ref 26.0–34.0)
MCHC: 35.1 g/dL (ref 30.0–36.0)
MCV: 88.9 fL (ref 80.0–100.0)
Monocytes Absolute: 1.7 10*3/uL — ABNORMAL HIGH (ref 0.1–1.0)
Monocytes Relative: 12 %
Neutro Abs: 10.6 10*3/uL — ABNORMAL HIGH (ref 1.7–7.7)
Neutrophils Relative %: 75 %
Platelet Count: 272 10*3/uL (ref 150–400)
RBC: 4.77 MIL/uL (ref 4.22–5.81)
RDW: 13.4 % (ref 11.5–15.5)
WBC Count: 14.1 10*3/uL — ABNORMAL HIGH (ref 4.0–10.5)
nRBC: 0.1 % (ref 0.0–0.2)

## 2022-12-22 MED ORDER — SODIUM CHLORIDE 0.9 % IV SOLN: Freq: Once | INTRAVENOUS | Status: AC

## 2022-12-22 MED ORDER — LACTATED RINGERS IV BOLUS
1000.0000 mL | Freq: Once | INTRAVENOUS | Status: AC
  Administered 2022-12-22: 1000 mL via INTRAVENOUS

## 2022-12-22 NOTE — ED Provider Notes (Signed)
[facility] DEPT Provider Note: Georgena Spurling, MD, FACEP  CSN: [id number]: [id number] ARRIVAL: 12/22/22 at 2257 ROOM: [facility]   HISTORY OF PRESENT ILLNESS  12/22/22 11:13 PM Jonathan Hudson is a 57 y.o. 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  He is here with shortness of breath, nonproductive cough and fever that began earlier today.  He had been feeling well up until today but now he feels completely drained with generalized weakness.  He has not had vomiting but is nauseated.  He reports a fever of 100.8 at home.  He was afebrile on arrival.ns    Past Medical History:  Diagnosis Date   Boils 01/01/2017   Hypercholesteremia    Hypertension    Tonsillar cancer [facility])     Past Surgical History:  Procedure Laterality Date   COLONOSCOPY N/A 01/29/2017   Procedure: COLONOSCOPY;  Surgeon: Rogene Houston, MD;  Location: [facility];  Service: Endoscopy;  Laterality: N/A;  730   COLONOSCOPY WITH PROPOFOL N/A 04/03/2022   Procedure: COLONOSCOPY WITH PROPOFOL;  Surgeon: Rogene Houston, MD;  Location: [facility];  Service: Endoscopy;  Laterality: N/A;  730   cyst removed from left wrist      EYE SURGERY     grafts N/A 10  years  ago   grafts to gums   HEMOSTASIS CLIP PLACEMENT  04/03/2022   Procedure: HEMOSTASIS CLIP PLACEMENT;  Surgeon: Rogene Houston, MD;  [facility]: [facility];  Service: Endoscopy;;   IR GASTROSTOMY TUBE MOD SED  12/15/2022   IR IMAGING GUIDED PORT INSERTION  12/15/2022   IR NASO G TUBE PLC W/FL W/RAD  12/15/2022   OPEN REDUCTION INTERNAL FIXATION (ORIF) DISTAL RADIAL FRACTURE Right 12/28/2013   Procedure: OPEN REDUCTION INTERNAL FIXATION (ORIF) DISTAL RADIAL FRACTURE;  Surgeon: Carole Civil, MD;  Location: [facility];  Service: Orthopedics;  Laterality: Right;   ORIF ULNAR FRACTURE Right 12/28/2013   Procedure: OPEN REDUCTION INTERNAL FIXATION (ORIF) ULNAR  FRACTURE;  Surgeon: Carole Civil, MD;  Location: [facility];  Service: Orthopedics;  Laterality: Right;   POLYPECTOMY  04/03/2022   Procedure: POLYPECTOMY;  Surgeon: Rogene Houston, MD;  [facility]: [facility];  Service: Endoscopy;;   right knee Right teenager   right knee     Family History  Problem Relation Age of Onset   Cancer Mother        small bowel spread to liver   Pancreatic cancer Father    Cancer Maternal Aunt    Brain cancer Maternal Uncle    Cancer Paternal Aunt    Breast cancer Cousin    Cancer Paternal Uncle     Social History   Tobacco Use   Smoking status: Never   Smokeless tobacco: Never  Vaping Use   Vaping Use: Never used  Substance Use Topics   Alcohol use: Yes    Comment: Occ  beer or wine    Drug use: No    Prior to Admission medications   Medication Sig Start Date End Date Taking? Authorizing Provider  lidocaine (XYLOCAINE) 2 % solution Patient: Mix 1part 2% viscous lidocaine, 1part H20. Swish & swallow 73m of diluted mixture, 315m before meals and at bedtime, up to QID 12/08/22   SqEppie GibsonMD  oxyCODONE (OXY IR/ROXICODONE) 5 MG immediate release tablet Take 1 tablet (5 mg total) by mouth every 6 (six) hours as needed for severe pain  or moderate pain. 12/15/22   Brunetta Genera, MD  amLODipine (NORVASC) 10 MG tablet Take 10 mg by mouth daily. 02/19/22   [provider]  atorvastatin (LIPITOR) 40 MG tablet Take 40 mg by mouth daily.    [provider]  dexamethasone (DECADRON) 4 MG tablet Take 2 tablets daily x 3 days starting the day after cisplatin chemotherapy. Take with food. 12/02/22   Brunetta Genera, MD  doxycycline (VIBRAMYCIN) 100 MG capsule Take 1 capsule (100 mg total) by mouth 2 (two) times daily. 09/04/22   Volney American, PA-C  lansoprazole (PREVACID) 15 MG capsule Take 15 mg by mouth every morning.    [provider]  lidocaine-prilocaine (EMLA) cream Apply to affected area once 12/02/22    Brunetta Genera, MD  Multiple Vitamin (MULTIVITAMIN WITH MINERALS) TABS tablet Take 1 tablet by mouth daily.    [provider]  olmesartan (BENICAR) 40 MG tablet Take 40 mg by mouth daily. 02/19/22   [provider]  ondansetron (ZOFRAN) 8 MG tablet Take 1 tablet (8 mg total) by mouth every 8 (eight) hours as needed for nausea or vomiting. Start on the third day after cisplatin. 12/02/22   Brunetta Genera, MD  prochlorperazine (COMPAZINE) 10 MG tablet Take 1 tablet (10 mg total) by mouth every 6 (six) hours as needed (Nausea or vomiting). 12/02/22   Brunetta Genera, MD  senna-docusate (SENNA S) 8.6-50 MG tablet Take 2 tablets by mouth at bedtime. 12/17/22   Brunetta Genera, MD  sildenafil (VIAGRA) 100 MG tablet Take 100 mg by mouth daily as needed for erectile dysfunction. Patient not taking: Reported on 11/18/2022 01/13/17   [provider]    Allergies Patient has no known allergies.   REVIEW OF SYSTEMS  Negative except as noted here or in the History of Present Illness.   PHYSICAL EXAMINATION  Initial Vital Signs Blood pressure 122/83, pulse (!) 104, temperature 98.7 F (37.1 C), temperature source Oral, resp. rate (!) 25, SpO2 100 %.  Examination General: Well-developed, well-nourished male in no acute distress; appearance consistent with age of record HENT: normocephalic; atraumatic; erythema and exudate of left tonsil without tonsillar enlargement Eyes: pupils equal, round and reactive to light; extraocular muscles intact; arcus senilis bilaterally Neck: supple Heart: regular rate and rhythm Lungs: clear to auscultation bilaterally; tachypnea Chest: Cath right upper quadrant Abdomen: soft; nondistended; nontender; bowel sounds present; G-tube left upper quadrant Extremities: No deformity; full range of motion; pulses normal Neurologic: Awake, alert and oriented; motor function intact in all extremities and symmetric; no facial  droop Skin: Warm and dry Psychiatric: Flat affect   RESULTS  Summary of this visit's results, reviewed and interpreted by myself:   EKG Interpretation  Date/Time:  Monday December 22 2022 23:20:22 EST Ventricular Rate:  97 PR Interval:  133 QRS Duration: 90 QT Interval:  337 QTC Calculation: 428 R Axis:   -20 Text Interpretation: Sinus rhythm Probable left atrial enlargement Abnormal R-wave progression, early transition LVH with secondary repolarization abnormality Rate is faster Confirmed by Laiyla Slagel [phone]) on 12/22/2022 11:23:47 PM       Laboratory Studies: Results for orders placed or performed during the hospital encounter of 12/22/22 (from the past 24 hour(s))  Comprehensive metabolic panel     Status: Abnormal   Collection Time: 12/22/22 11:40 PM  Result Value Ref Range   Sodium 132 (L) 135 - 145 mmol/L   Potassium 4.1 3.5 - 5.1 mmol/L   Chloride 97 (  L) 98 - 111 mmol/L   CO2 25 22 - 32 mmol/L   Glucose, Bld 114 (H) 70 - 99 mg/dL   BUN 16 6 - 20 mg/dL   Creatinine, Ser 0.87 0.61 - 1.24 mg/dL   Calcium 8.9 8.9 - 10.3 mg/dL   Total Protein 6.9 6.5 - 8.1 g/dL   Albumin 3.4 (L) 3.5 - 5.0 g/dL   AST 17 15 - 41 U/L   ALT 26 0 - 44 U/L   Alkaline Phosphatase 64 38 - 126 U/L   Total Bilirubin 0.8 0.3 - 1.2 mg/dL   GFR, Estimated >60 >60 mL/min   Anion gap 10 5 - 15  Lactic acid, plasma     Status: None   Collection Time: 12/22/22 11:40 PM  Result Value Ref Range   Lactic Acid, Venous 1.0 0.5 - 1.9 mmol/L  CBC with Differential     Status: Abnormal   Collection Time: 12/22/22 11:40 PM  Result Value Ref Range   WBC 16.6 (H) 4.0 - 10.5 K/uL   RBC 4.85 4.22 - 5.81 MIL/uL   Hemoglobin 14.8 13.0 - 17.0 g/dL   HCT 43.7 39.0 - 52.0 %   MCV 90.1 80.0 - 100.0 fL   MCH 30.5 26.0 - 34.0 pg   MCHC 33.9 30.0 - 36.0 g/dL   RDW 13.5 11.5 - 15.5 %   Platelets 268 150 - 400 K/uL   nRBC 0.0 0.0 - 0.2 %   Neutrophils Relative % 74 %   Neutro Abs 12.3 (H) 1.7 - 7.7 K/uL    Lymphocytes Relative 13 %   Lymphs Abs 2.2 0.7 - 4.0 K/uL   Monocytes Relative 10 %   Monocytes Absolute 1.7 (H) 0.1 - 1.0 K/uL   Eosinophils Relative 1 %   Eosinophils Absolute 0.1 0.0 - 0.5 K/uL   Basophils Relative 0 %   Basophils Absolute 0.0 0.0 - 0.1 K/uL   Immature Granulocytes 2 %   Abs Immature Granulocytes 0.25 (H) 0.00 - 0.07 K/uL  Protime-INR     Status: None   Collection Time: 12/22/22 11:40 PM  Result Value Ref Range   Prothrombin Time 12.7 11.4 - 15.2 seconds   INR 1.0 0.8 - 1.2  D-dimer, quantitative     Status: Abnormal   Collection Time: 12/22/22 11:40 PM  Result Value Ref Range   D-Dimer, Quant 0.75 (H) 0.00 - 0.50 ug/mL-FEU  Resp panel by RT-PCR (RSV, Flu A&B, Covid)     Status: None   Collection Time: 12/22/22 11:54 PM   Specimen: Nasal Swab  Result Value Ref Range   SARS Coronavirus 2 by RT PCR NEGATIVE NEGATIVE   Influenza A by PCR NEGATIVE NEGATIVE   Influenza B by PCR NEGATIVE NEGATIVE   Resp Syncytial Virus by PCR NEGATIVE NEGATIVE  Urinalysis, Routine w reflex microscopic -Urine, Clean Catch     Status: Abnormal   Collection Time: 12/23/22  1:09 AM  Result Value Ref Range   Color, Urine YELLOW YELLOW   APPearance CLEAR CLEAR   Specific Gravity, Urine 1.012 1.005 - 1.030   pH 7.0 5.0 - 8.0   Glucose, UA NEGATIVE NEGATIVE mg/dL   Hgb urine dipstick NEGATIVE NEGATIVE   Bilirubin Urine NEGATIVE NEGATIVE   Ketones, ur NEGATIVE NEGATIVE mg/dL   Protein, ur NEGATIVE NEGATIVE mg/dL   Nitrite NEGATIVE NEGATIVE   Leukocytes,Ua MODERATE (A) NEGATIVE   RBC / HPF 0-5 0 - 5 RBC/hpf   WBC, UA 6-10 0 - 5 WBC/hpf  Bacteria, UA RARE (A) NONE SEEN   Squamous Epithelial / HPF 0-5 0 - 5 /HPF   Mucus PRESENT    Imaging Studies: CT Angio Chest PE W and/or Wo Contrast  Result Date: 12/23/2022 CLINICAL DATA:  Pulmonary embolism suspected. High probability. Dry cough and fevers. Currently undergoing chemoradiation therapy for tonsillar cancer. Port placed  12/17/2022. PEG placed at that time. EXAM: CT ANGIOGRAPHY CHEST WITH CONTRAST TECHNIQUE: Multidetector CT imaging of the chest was performed using the standard protocol during bolus administration of intravenous contrast. Multiplanar CT image reconstructions and MIPs were obtained to evaluate the vascular anatomy. RADIATION DOSE REDUCTION: This exam was performed according to the departmental dose-optimization program which includes automated exposure control, adjustment of the mA and/or kV according to patient size and/or use of iterative reconstruction technique. CONTRAST:  43m OMNIPAQUE IOHEXOL 350 MG/ML SOLN COMPARISON:  PET-CT 11/13/2022, portable chest yesterday, PA and lateral 12/13/2016 FINDINGS: Cardiovascular: There is mild cardiomegaly with a left chamber predominance, septal and left ventricular wall and papillary muscle hypertrophy. The pulmonary veins are decompressed. There are trace calcifications in the proximal LAD and proximal circumflex coronary arteries. The aorta and great vessels are normal caliber without evidence of significant plaques, aneurysm or dissection. Again noted is a right chest implanted port with IJ approach catheter terminating in the distal SVC. There is no subcutaneous abscess around the port. Pulmonary arteries are normal in caliber without appreciable thromboemboli. Mediastinum/Nodes: No enlarged mediastinal, hilar, or axillary lymph nodes. Thyroid gland, trachea, and esophagus demonstrate no significant findings. Lungs/Pleura: Lungs are clear. No pleural effusion or pneumothorax. Upper Abdomen: Again noted are multiple hepatic cysts and additional too small to characterize scattered subcentimeter hypodensities, unchanged. No acute findings. Musculoskeletal: No chest wall abnormality. No acute or significant osseous findings. Review of the MIP images confirms the above findings. IMPRESSION: 1. No evidence of a pulmonary embolus. 2. Mild cardiomegaly with a left chamber  predominance, septal and left ventricular wall and papillary muscle hypertrophy. 3. Trace coronary artery calcifications in the proximal LAD and proximal circumflex coronary arteries. No significant or appreciable aortic plaques. 4. No other significant findings.  Multiple hepatic cysts. Aortic Atherosclerosis (ICD10-I70.0). Electronically Signed   By: KTelford NabM.D.   On: 12/23/2022 02:33   DG Chest Portable 1 View  Result Date: 12/22/2022 CLINICAL DATA:  Shortness of breath, dry cough and fever. EXAM: PORTABLE CHEST 1 VIEW COMPARISON:  PA and lateral 12/13/16. FINDINGS: The heart size and mediastinal contours are within normal limits. Both lungs are clear of infiltrates. A calcified left mid perihilar granuloma is again noted. The visualized skeletal structures are unremarkable. Not seen previously, there is an implanted right chest port with the IJ approach catheter terminating in the distal SVC. IMPRESSION: No active disease. Right chest port in place. Old granulomatous disease. Electronically Signed   By: KTelford NabM.D.   On: 12/22/2022 23:27    ED COURSE and MDM  Nursing notes, initial and subsequent vitals signs, including pulse oximetry, reviewed and interpreted by myself.  Vitals:   12/23/22 0000 12/23/22 0100 12/23/22 0200 12/23/22 0245  BP: 118/83 123/71    Pulse: 84 80 84 87  Resp: 18 (!) 22 (!) 22 19  Temp:      TempSrc:      SpO2: 100% 99% 99% 99%   Medications  sodium chloride (PF) 0.9 % injection (has no administration in time range)  lactated ringers bolus 1,000 mL (0 mLs Intravenous Stopped 12/23/22 0249)  iohexol (OMNIPAQUE) 350  MG/ML injection 75 mL (75 mLs Intravenous Contrast Given 12/23/22 0129)   2:43 AM Shows weakness and shortness of breath resolved after a liter of IV fluids.  He has been afebrile in the ED.  He does have a leukocytosis but took dexamethasone for 3 days after chemotherapy 6 days ago.  He feels essentially back to normal.  His vital signs are  normal.  He is in no distress and nontoxic-appearing I believe it is safe for him to be discharged home at this time the cause of his shortness of breath is unclear but could be one of the respiratory viruses that were not tested.  He is negative for the tested respiratory viruses.  He has a follow-up appointment at the cancer center at 9:00 this morning.   PROCEDURES  Procedures   ED DIAGNOSES     ICD-10-CM   1. Shortness of breath  R06.02          Ritaj Dullea, Jenny Reichmann, MD 12/23/22 [phone]

## 2022-12-22 NOTE — ED Notes (Signed)
Pt labored breathing. Dyspnea with exertion. Pt had a difficult time catching his breath after moving from wheelchair to stretcher. Placed on 2 L nasal cannula for comfort. No hypoxia noted.

## 2022-12-22 NOTE — Progress Notes (Signed)
Oncology Nurse Navigator Documentation   Two buttons removed from PEG site without difficulty with suture removal kit. He tolerated well and new dressing applied. He knows to call me if he has any needs.   Harlow Asa RN, BSN, OCN Head & Neck Oncology Nurse [place] [facility] Phone # [phone]  Fax # [phone]

## 2022-12-22 NOTE — Progress Notes (Signed)
Opened in error

## 2022-12-22 NOTE — Patient Instructions (Signed)

## 2022-12-22 NOTE — ED Triage Notes (Signed)
Pt via POV c/o SOB, non-productive cough, and fevers that started earlier today. Report T 100.8. Currently undergoing chemotherapy/radiation therapy. Unknown sick exposure.

## 2022-12-23 ENCOUNTER — Ambulatory Visit
Admission: RE | Admit: 2022-12-23 | Discharge: 2022-12-23 | Disposition: A | Discharge status: Home / Self Care | Payer: 59 | Source: Ambulatory Visit | Attending: Radiation Oncology | Admitting: Radiation Oncology

## 2022-12-23 ENCOUNTER — Other Ambulatory Visit: Payer: Self-pay

## 2022-12-23 ENCOUNTER — Emergency Department (HOSPITAL_COMMUNITY): Payer: 59

## 2022-12-23 ENCOUNTER — Encounter: Payer: Self-pay | Admitting: Hematology

## 2022-12-23 LAB — URINALYSIS, ROUTINE W REFLEX MICROSCOPIC
Bilirubin Urine: NEGATIVE
Glucose, UA: NEGATIVE mg/dL
Hgb urine dipstick: NEGATIVE
Ketones, ur: NEGATIVE mg/dL
Nitrite: NEGATIVE
Protein, ur: NEGATIVE mg/dL
Specific Gravity, Urine: 1.012 (ref 1.005–1.030)
pH: 7 (ref 5.0–8.0)

## 2022-12-23 LAB — CBC WITH DIFFERENTIAL/PLATELET
Abs Immature Granulocytes: 0.25 10*3/uL — ABNORMAL HIGH (ref 0.00–0.07)
Basophils Absolute: 0 10*3/uL (ref 0.0–0.1)
Basophils Relative: 0 %
Eosinophils Absolute: 0.1 10*3/uL (ref 0.0–0.5)
Eosinophils Relative: 1 %
HCT: 43.7 % (ref 39.0–52.0)
Hemoglobin: 14.8 g/dL (ref 13.0–17.0)
Immature Granulocytes: 2 %
Lymphocytes Relative: 13 %
Lymphs Abs: 2.2 10*3/uL (ref 0.7–4.0)
MCH: 30.5 pg (ref 26.0–34.0)
MCHC: 33.9 g/dL (ref 30.0–36.0)
MCV: 90.1 fL (ref 80.0–100.0)
Monocytes Absolute: 1.7 10*3/uL — ABNORMAL HIGH (ref 0.1–1.0)
Monocytes Relative: 10 %
Neutro Abs: 12.3 10*3/uL — ABNORMAL HIGH (ref 1.7–7.7)
Neutrophils Relative %: 74 %
Platelets: 268 10*3/uL (ref 150–400)
RBC: 4.85 MIL/uL (ref 4.22–5.81)
RDW: 13.5 % (ref 11.5–15.5)
WBC: 16.6 10*3/uL — ABNORMAL HIGH (ref 4.0–10.5)
nRBC: 0 % (ref 0.0–0.2)

## 2022-12-23 LAB — RAD ONC ARIA SESSION SUMMARY
Course Elapsed Days: 15
Plan Fractions Treated to Date: 12
Plan Prescribed Dose Per Fraction: 2 Gy
Plan Total Fractions Prescribed: 35
Plan Total Prescribed Dose: 70 Gy
Reference Point Dosage Given to Date: 24 Gy
Reference Point Session Dosage Given: 2 Gy
Session Number: 12

## 2022-12-23 LAB — COMPREHENSIVE METABOLIC PANEL
ALT: 26 U/L (ref 0–44)
AST: 17 U/L (ref 15–41)
Albumin: 3.4 g/dL — ABNORMAL LOW (ref 3.5–5.0)
Alkaline Phosphatase: 64 U/L (ref 38–126)
Anion gap: 10 (ref 5–15)
BUN: 16 mg/dL (ref 6–20)
CO2: 25 mmol/L (ref 22–32)
Calcium: 8.9 mg/dL (ref 8.9–10.3)
Chloride: 97 mmol/L — ABNORMAL LOW (ref 98–111)
Creatinine, Ser: 0.87 mg/dL (ref 0.61–1.24)
GFR, Estimated: >60 mL/min (ref >60)
Glucose, Bld: 114 mg/dL — ABNORMAL HIGH (ref 70–99)
Potassium: 4.1 mmol/L (ref 3.5–5.1)
Sodium: 132 mmol/L — ABNORMAL LOW (ref 135–145)
Total Bilirubin: 0.8 mg/dL (ref 0.3–1.2)
Total Protein: 6.9 g/dL (ref 6.5–8.1)

## 2022-12-23 LAB — RESP PANEL BY RT-PCR (RSV, FLU A&B, COVID)  RVPGX2
Influenza A by PCR: NEGATIVE
Influenza B by PCR: NEGATIVE
Resp Syncytial Virus by PCR: NEGATIVE
SARS Coronavirus 2 by RT PCR: NEGATIVE

## 2022-12-23 LAB — LACTIC ACID, PLASMA: Lactic Acid, Venous: 1 mmol/L (ref 0.5–1.9)

## 2022-12-23 LAB — PROTIME-INR
INR: 1 (ref 0.8–1.2)
Prothrombin Time: 12.7 seconds (ref 11.4–15.2)

## 2022-12-23 LAB — D-DIMER, QUANTITATIVE: D-Dimer, Quant: 0.75 ug/mL-FEU — ABNORMAL HIGH (ref 0.00–0.50)

## 2022-12-23 MED ORDER — SODIUM CHLORIDE (PF) 0.9 % IJ SOLN
INTRAMUSCULAR | Status: AC
  Filled 2022-12-23: qty 50

## 2022-12-23 MED ORDER — IOHEXOL 350 MG/ML SOLN
75.0000 mL | Freq: Once | INTRAVENOUS | Status: AC | PRN
  Administered 2022-12-23: 75 mL via INTRAVENOUS

## 2022-12-23 MED FILL — Dexamethasone Sodium Phosphate Inj 100 MG/10ML: INTRAMUSCULAR | Qty: 1 | Status: AC

## 2022-12-23 MED FILL — Fosaprepitant Dimeglumine For IV Infusion 150 MG (Base Eq): INTRAVENOUS | Qty: 5 | Status: AC

## 2022-12-23 NOTE — Patient Instructions (Signed)
Integratef svh

## 2022-12-23 NOTE — ED Notes (Signed)
No repeat lactic per provider

## 2022-12-24 ENCOUNTER — Other Ambulatory Visit: Payer: Self-pay

## 2022-12-24 ENCOUNTER — Other Ambulatory Visit: Payer: Self-pay | Admitting: Hematology

## 2022-12-24 ENCOUNTER — Inpatient Hospital Stay: Payer: 59

## 2022-12-24 ENCOUNTER — Ambulatory Visit
Admission: RE | Admit: 2022-12-24 | Discharge: 2022-12-24 | Disposition: A | Discharge status: Home / Self Care | Payer: 59 | Source: Ambulatory Visit | Attending: Radiation Oncology | Admitting: Radiation Oncology

## 2022-12-24 ENCOUNTER — Inpatient Hospital Stay: Payer: 59 | Admitting: Dietician

## 2022-12-24 VITALS — BP 125/75 | HR 72 | Temp 99.1°F | Resp 17

## 2022-12-24 DIAGNOSIS — C090 Malignant neoplasm of tonsillar fossa: Secondary | ICD-10-CM

## 2022-12-24 DIAGNOSIS — Q649 Congenital malformation of urinary system, unspecified: Secondary | ICD-10-CM

## 2022-12-24 DIAGNOSIS — Z95828 Presence of other vascular implants and grafts: Secondary | ICD-10-CM | POA: Insufficient documentation

## 2022-12-24 DIAGNOSIS — Z5111 Encounter for antineoplastic chemotherapy: Secondary | ICD-10-CM | POA: Diagnosis not present

## 2022-12-24 LAB — CBC WITH DIFFERENTIAL (CANCER CENTER ONLY)
Abs Immature Granulocytes: 0.04 10*3/uL (ref 0.00–0.07)
Basophils Absolute: 0 10*3/uL (ref 0.0–0.1)
Basophils Relative: 0 %
Eosinophils Absolute: 0.1 10*3/uL (ref 0.0–0.5)
Eosinophils Relative: 0 %
HCT: 40.3 % (ref 39.0–52.0)
Hemoglobin: 14 g/dL (ref 13.0–17.0)
Immature Granulocytes: 0 %
Lymphocytes Relative: 11 %
Lymphs Abs: 1.5 10*3/uL (ref 0.7–4.0)
MCH: 31 pg (ref 26.0–34.0)
MCHC: 34.7 g/dL (ref 30.0–36.0)
MCV: 89.4 fL (ref 80.0–100.0)
Monocytes Absolute: 1.3 10*3/uL — ABNORMAL HIGH (ref 0.1–1.0)
Monocytes Relative: 10 %
Neutro Abs: 10.9 10*3/uL — ABNORMAL HIGH (ref 1.7–7.7)
Neutrophils Relative %: 79 %
Platelet Count: 255 10*3/uL (ref 150–400)
RBC: 4.51 MIL/uL (ref 4.22–5.81)
RDW: 13.2 % (ref 11.5–15.5)
WBC Count: 13.9 10*3/uL — ABNORMAL HIGH (ref 4.0–10.5)
nRBC: 0 % (ref 0.0–0.2)

## 2022-12-24 LAB — MAGNESIUM: Magnesium: 2.1 mg/dL (ref 1.7–2.4)

## 2022-12-24 LAB — URINALYSIS, COMPLETE (UACMP) WITH MICROSCOPIC
Bilirubin Urine: NEGATIVE
Glucose, UA: NEGATIVE mg/dL
Hgb urine dipstick: NEGATIVE
Ketones, ur: NEGATIVE mg/dL
Leukocytes,Ua: NEGATIVE
Nitrite: NEGATIVE
Protein, ur: NEGATIVE mg/dL
Specific Gravity, Urine: 1.023 (ref 1.005–1.030)
pH: 5 (ref 5.0–8.0)

## 2022-12-24 LAB — BASIC METABOLIC PANEL - CANCER CENTER ONLY
Anion gap: 8 (ref 5–15)
BUN: 22 mg/dL — ABNORMAL HIGH (ref 6–20)
CO2: 28 mmol/L (ref 22–32)
Calcium: 8.7 mg/dL — ABNORMAL LOW (ref 8.9–10.3)
Chloride: 97 mmol/L — ABNORMAL LOW (ref 98–111)
Creatinine: 1 mg/dL (ref 0.61–1.24)
GFR, Estimated: >60 mL/min (ref >60)
Glucose, Bld: 107 mg/dL — ABNORMAL HIGH (ref 70–99)
Potassium: 4.7 mmol/L (ref 3.5–5.1)
Sodium: 133 mmol/L — ABNORMAL LOW (ref 135–145)

## 2022-12-24 LAB — RAD ONC ARIA SESSION SUMMARY
Course Elapsed Days: 16
Plan Fractions Treated to Date: 13
Plan Prescribed Dose Per Fraction: 2 Gy
Plan Total Fractions Prescribed: 35
Plan Total Prescribed Dose: 70 Gy
Reference Point Dosage Given to Date: 26 Gy
Reference Point Session Dosage Given: 2 Gy
Session Number: 13

## 2022-12-24 MED ORDER — SODIUM CHLORIDE 0.9 % IV SOLN: Freq: Once | INTRAVENOUS | Status: AC

## 2022-12-24 MED ORDER — SODIUM CHLORIDE 0.9 % IV SOLN
150.0000 mg | Freq: Once | INTRAVENOUS | Status: AC
  Administered 2022-12-24: 150 mg via INTRAVENOUS
  Filled 2022-12-24: qty 150

## 2022-12-24 MED ORDER — SODIUM CHLORIDE 0.9 % IV SOLN
40.0000 mg/m2 | Freq: Once | INTRAVENOUS | Status: AC
  Administered 2022-12-24: 100 mg via INTRAVENOUS
  Filled 2022-12-24: qty 100

## 2022-12-24 MED ORDER — HEPARIN SOD (PORK) LOCK FLUSH 100 UNIT/ML IV SOLN
500.0000 [IU] | Freq: Once | INTRAVENOUS | Status: AC | PRN
  Administered 2022-12-24: 500 [IU]

## 2022-12-24 MED ORDER — SODIUM CHLORIDE 0.9 % IV SOLN
10.0000 mg | Freq: Once | INTRAVENOUS | Status: AC
  Administered 2022-12-24: 10 mg via INTRAVENOUS
  Filled 2022-12-24: qty 10

## 2022-12-24 MED ORDER — MAGNESIUM SULFATE 2 GM/50ML IV SOLN
2.0000 g | Freq: Once | INTRAVENOUS | Status: AC
  Administered 2022-12-24: 2 g via INTRAVENOUS
  Filled 2022-12-24: qty 50

## 2022-12-24 MED ORDER — SODIUM CHLORIDE 0.9% FLUSH
10.0000 mL | INTRAVENOUS | Status: DC | PRN
  Administered 2022-12-24: 10 mL

## 2022-12-24 MED ORDER — LIDOCAINE VISCOUS HCL 2 % MT SOLN
15.0000 mL | Freq: Once | OROMUCOSAL | Status: AC
  Administered 2022-12-24: 15 mL via OROMUCOSAL
  Filled 2022-12-24: qty 15

## 2022-12-24 MED ORDER — PALONOSETRON HCL INJECTION 0.25 MG/5ML
0.2500 mg | Freq: Once | INTRAVENOUS | Status: AC
  Administered 2022-12-24: 0.25 mg via INTRAVENOUS
  Filled 2022-12-24: qty 5

## 2022-12-24 MED ORDER — LIDOCAINE HCL 2 % IJ SOLN: 5.0000 mL | Freq: Once | INTRAMUSCULAR | Status: DC

## 2022-12-24 MED ORDER — POTASSIUM CHLORIDE IN NACL 20-0.9 MEQ/L-% IV SOLN
Freq: Once | INTRAVENOUS | Status: AC
  Filled 2022-12-24: qty 1000

## 2022-12-24 MED ORDER — SODIUM CHLORIDE 0.9% FLUSH
10.0000 mL | Freq: Once | INTRAVENOUS | Status: AC
  Administered 2022-12-24: 10 mL

## 2022-12-24 NOTE — Progress Notes (Signed)
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 He is receiving concurrent chemoradiation with weekly cisplatin (final radiation planned 3/22). S/p PEG on 2/12   2/19 - pt seen in ED with shortness of breath/weakness  Met with patient during infusion. Patient to receive additional IV fluids with treatment today.  Patient states he has had a rough few days. His throat is sore. He endorses pain with swallowing. Patient says lidocaine rinse has been helpful. He did not bring this with him today. Patient has thick saliva. He is doing baking soda salt water rinses several times daily. Patient reports decreased appetite and poor intake of food and drink orally due to this. Yesterday patient recalls small bowl of chicken noodle soup and one Ensure Original (220 kcal, 9 g protein). Patient gave one carton Osmolite 1.5 + 16 ounces of water via tube. Patient denies constipation, diarrhea, nausea, vomiting.     Medications: oxycodone, senna -s  Labs: Na 133, BUN 22  Anthropometrics: No new wt for review. Pt 247 lb 9.6 oz on 2/14   Estimated Energy Needs  Kcals: 2725-3052 Protein: 141-163 Fluid: >2.7 L  NEW NUTRITION DIAGNOSIS: Inadequate oral intake related to SCC of head and neck undergoing concurrent chemoradiation as evidenced by reported increased sore throat, pain with swallowing, s/p PEG to meet nutrition/hydration needs   INTERVENTION:  Encouraged oral intake of soft smooth foods as able Continue baking soda salt water rinses several times daily. Suggested sprite rinse for thick saliva. Provided sprite to pt during infusion to try. This worked well for him Orders placed for single dose lidocaine rinse per Dr. Isidore Moos - pt appreciative  Bolus feeding/education completed with patient during infusion. He tolerated one carton Osmolite 1.5 with 90 ml water flush before and after without difficulty. Regimen as below discussed with patient. Written  instructions provided. All questions answered. Teach back method used.  One complimentary case of Osmolite 1.5 + samples of ProSource TF provided   -Give 2 cartons (480 ml) Osmolite 1.5 QID. Flush tube with 90 ml water before and after each feeding -Give 45 ml ProSource TF BID. Flush tube with 30 ml water before and after  -Drink by mouth or give via tube additional 2 cups (480 ml) water  Regimen provides 1920 ml/day, 2920 kcal, 141 g protein, 1448 ml free water from formula (2708 ml total water with flushes). Meets 100% RDI    MONITORING, EVALUATION, GOAL: weight trends, intake    NEXT VISIT: Tuesday February 27 during infusion

## 2022-12-25 ENCOUNTER — Ambulatory Visit
Admission: RE | Admit: 2022-12-25 | Discharge: 2022-12-25 | Disposition: A | Discharge status: Home / Self Care | Payer: 59 | Source: Ambulatory Visit | Attending: Radiation Oncology | Admitting: Radiation Oncology

## 2022-12-25 ENCOUNTER — Other Ambulatory Visit: Payer: Self-pay

## 2022-12-25 DIAGNOSIS — Z5111 Encounter for antineoplastic chemotherapy: Secondary | ICD-10-CM | POA: Diagnosis not present

## 2022-12-25 LAB — URINE CULTURE: Culture: NO GROWTH

## 2022-12-25 LAB — RAD ONC ARIA SESSION SUMMARY
Course Elapsed Days: 17
Plan Fractions Treated to Date: 14
Plan Prescribed Dose Per Fraction: 2 Gy
Plan Total Fractions Prescribed: 35
Plan Total Prescribed Dose: 70 Gy
Reference Point Dosage Given to Date: 28 Gy
Reference Point Session Dosage Given: 2 Gy
Session Number: 14

## 2022-12-26 ENCOUNTER — Ambulatory Visit
Admission: RE | Admit: 2022-12-26 | Discharge: 2022-12-26 | Disposition: A | Discharge status: Home / Self Care | Payer: 59 | Source: Ambulatory Visit | Attending: Radiation Oncology | Admitting: Radiation Oncology

## 2022-12-26 ENCOUNTER — Other Ambulatory Visit: Payer: Self-pay

## 2022-12-26 DIAGNOSIS — Z5111 Encounter for antineoplastic chemotherapy: Secondary | ICD-10-CM | POA: Diagnosis not present

## 2022-12-26 DIAGNOSIS — C090 Malignant neoplasm of tonsillar fossa: Secondary | ICD-10-CM

## 2022-12-26 LAB — RAD ONC ARIA SESSION SUMMARY
Course Elapsed Days: 18
Plan Fractions Treated to Date: 15
Plan Prescribed Dose Per Fraction: 2 Gy
Plan Total Fractions Prescribed: 35
Plan Total Prescribed Dose: 70 Gy
Reference Point Dosage Given to Date: 30 Gy
Reference Point Session Dosage Given: 2 Gy
Session Number: 15

## 2022-12-26 MED ORDER — OXYCODONE HCL 5 MG PO TABS: 5.0000 mg | ORAL_TABLET | Freq: Four times a day (QID) | ORAL | 0 refills | Status: DC | PRN

## 2022-12-28 LAB — CULTURE, BLOOD (ROUTINE X 2)
Culture: NO GROWTH
Culture: NO GROWTH

## 2022-12-29 ENCOUNTER — Ambulatory Visit
Admission: RE | Admit: 2022-12-29 | Discharge: 2022-12-29 | Disposition: A | Discharge status: Home / Self Care | Payer: 59 | Source: Ambulatory Visit | Attending: Radiation Oncology | Admitting: Radiation Oncology

## 2022-12-29 ENCOUNTER — Other Ambulatory Visit: Payer: Self-pay

## 2022-12-29 ENCOUNTER — Ambulatory Visit: Payer: 59

## 2022-12-29 DIAGNOSIS — Z5111 Encounter for antineoplastic chemotherapy: Secondary | ICD-10-CM | POA: Diagnosis not present

## 2022-12-29 LAB — RAD ONC ARIA SESSION SUMMARY
Course Elapsed Days: 21
Plan Fractions Treated to Date: 16
Plan Prescribed Dose Per Fraction: 2 Gy
Plan Total Fractions Prescribed: 35
Plan Total Prescribed Dose: 70 Gy
Reference Point Dosage Given to Date: 32 Gy
Reference Point Session Dosage Given: 2 Gy
Session Number: 16

## 2022-12-29 MED FILL — Fosaprepitant Dimeglumine For IV Infusion 150 MG (Base Eq): INTRAVENOUS | Qty: 5 | Status: AC

## 2022-12-29 MED FILL — Dexamethasone Sodium Phosphate Inj 100 MG/10ML: INTRAMUSCULAR | Qty: 1 | Status: AC

## 2022-12-30 ENCOUNTER — Encounter: Payer: Self-pay | Admitting: Hematology

## 2022-12-30 ENCOUNTER — Inpatient Hospital Stay: Payer: 59 | Admitting: Nutrition

## 2022-12-30 ENCOUNTER — Inpatient Hospital Stay: Payer: 59

## 2022-12-30 ENCOUNTER — Inpatient Hospital Stay: Payer: 59 | Admitting: Hematology

## 2022-12-30 ENCOUNTER — Other Ambulatory Visit: Payer: Self-pay

## 2022-12-30 ENCOUNTER — Inpatient Hospital Stay (HOSPITAL_BASED_OUTPATIENT_CLINIC_OR_DEPARTMENT_OTHER): Payer: 59 | Admitting: Hematology

## 2022-12-30 ENCOUNTER — Ambulatory Visit
Admission: RE | Admit: 2022-12-30 | Discharge: 2022-12-30 | Disposition: A | Discharge status: Home / Self Care | Payer: 59 | Source: Ambulatory Visit | Attending: Radiation Oncology | Admitting: Radiation Oncology

## 2022-12-30 ENCOUNTER — Other Ambulatory Visit (HOSPITAL_COMMUNITY): Payer: Self-pay

## 2022-12-30 VITALS — BP 125/83 | HR 67 | Temp 97.9°F | Resp 18

## 2022-12-30 DIAGNOSIS — B029 Zoster without complications: Secondary | ICD-10-CM | POA: Diagnosis not present

## 2022-12-30 DIAGNOSIS — Z5111 Encounter for antineoplastic chemotherapy: Secondary | ICD-10-CM | POA: Diagnosis not present

## 2022-12-30 DIAGNOSIS — C090 Malignant neoplasm of tonsillar fossa: Secondary | ICD-10-CM

## 2022-12-30 DIAGNOSIS — Y842 Radiological procedure and radiotherapy as the cause of abnormal reaction of the patient, or of later complication, without mention of misadventure at the time of the procedure: Secondary | ICD-10-CM

## 2022-12-30 DIAGNOSIS — K1233 Oral mucositis (ulcerative) due to radiation: Secondary | ICD-10-CM | POA: Diagnosis not present

## 2022-12-30 DIAGNOSIS — Z95828 Presence of other vascular implants and grafts: Secondary | ICD-10-CM

## 2022-12-30 LAB — BASIC METABOLIC PANEL - CANCER CENTER ONLY
Anion gap: 7 (ref 5–15)
BUN: 15 mg/dL (ref 6–20)
CO2: 29 mmol/L (ref 22–32)
Calcium: 8.7 mg/dL — ABNORMAL LOW (ref 8.9–10.3)
Chloride: 98 mmol/L (ref 98–111)
Creatinine: 1.01 mg/dL (ref 0.61–1.24)
GFR, Estimated: >60 mL/min (ref >60)
Glucose, Bld: 98 mg/dL (ref 70–99)
Potassium: 4.4 mmol/L (ref 3.5–5.1)
Sodium: 134 mmol/L — ABNORMAL LOW (ref 135–145)

## 2022-12-30 LAB — CBC WITH DIFFERENTIAL (CANCER CENTER ONLY)
Abs Immature Granulocytes: 0.02 10*3/uL (ref 0.00–0.07)
Basophils Absolute: 0 10*3/uL (ref 0.0–0.1)
Basophils Relative: 0 %
Eosinophils Absolute: 0.1 10*3/uL (ref 0.0–0.5)
Eosinophils Relative: 1 %
HCT: 38.5 % — ABNORMAL LOW (ref 39.0–52.0)
Hemoglobin: 13.6 g/dL (ref 13.0–17.0)
Immature Granulocytes: 0 %
Lymphocytes Relative: 11 %
Lymphs Abs: 1 10*3/uL (ref 0.7–4.0)
MCH: 31.4 pg (ref 26.0–34.0)
MCHC: 35.3 g/dL (ref 30.0–36.0)
MCV: 88.9 fL (ref 80.0–100.0)
Monocytes Absolute: 0.9 10*3/uL (ref 0.1–1.0)
Monocytes Relative: 9 %
Neutro Abs: 7.4 10*3/uL (ref 1.7–7.7)
Neutrophils Relative %: 79 %
Platelet Count: 315 10*3/uL (ref 150–400)
RBC: 4.33 MIL/uL (ref 4.22–5.81)
RDW: 13.2 % (ref 11.5–15.5)
WBC Count: 9.4 10*3/uL (ref 4.0–10.5)
nRBC: 0 % (ref 0.0–0.2)

## 2022-12-30 LAB — RAD ONC ARIA SESSION SUMMARY
Course Elapsed Days: 22
Plan Fractions Treated to Date: 17
Plan Prescribed Dose Per Fraction: 2 Gy
Plan Total Fractions Prescribed: 35
Plan Total Prescribed Dose: 70 Gy
Reference Point Dosage Given to Date: 34 Gy
Reference Point Session Dosage Given: 2 Gy
Session Number: 17

## 2022-12-30 LAB — MAGNESIUM: Magnesium: 2 mg/dL (ref 1.7–2.4)

## 2022-12-30 MED ORDER — LORAZEPAM 0.5 MG PO TABS: 0.5000 mg | ORAL_TABLET | Freq: Three times a day (TID) | ORAL | 0 refills | Status: DC

## 2022-12-30 MED ORDER — PALONOSETRON HCL INJECTION 0.25 MG/5ML
0.2500 mg | Freq: Once | INTRAVENOUS | Status: AC
  Administered 2022-12-30: 0.25 mg via INTRAVENOUS
  Filled 2022-12-30: qty 5

## 2022-12-30 MED ORDER — MAGNESIUM SULFATE 2 GM/50ML IV SOLN
2.0000 g | Freq: Once | INTRAVENOUS | Status: AC
  Administered 2022-12-30: 2 g via INTRAVENOUS
  Filled 2022-12-30: qty 50

## 2022-12-30 MED ORDER — SODIUM CHLORIDE 0.9 % IV SOLN: Freq: Once | INTRAVENOUS | Status: AC

## 2022-12-30 MED ORDER — SODIUM CHLORIDE 0.9% FLUSH
10.0000 mL | INTRAVENOUS | Status: DC | PRN
  Administered 2022-12-30: 10 mL

## 2022-12-30 MED ORDER — HEPARIN SOD (PORK) LOCK FLUSH 100 UNIT/ML IV SOLN
500.0000 [IU] | Freq: Once | INTRAVENOUS | Status: AC | PRN
  Administered 2022-12-30: 500 [IU]

## 2022-12-30 MED ORDER — STERILE WATER FOR INJECTION IJ SOLN
OROMUCOSAL | 0 refills | Status: DC
  Filled 2022-12-30: qty 300, 15d supply, fill #0

## 2022-12-30 MED ORDER — TRIPLE ANTIBIOTIC 3.5-400-5000 EX OINT: 1.0000 | TOPICAL_OINTMENT | Freq: Three times a day (TID) | CUTANEOUS | 1 refills | Status: DC

## 2022-12-30 MED ORDER — SODIUM CHLORIDE 0.9% FLUSH
10.0000 mL | Freq: Once | INTRAVENOUS | Status: AC
  Administered 2022-12-30: 10 mL

## 2022-12-30 MED ORDER — SODIUM CHLORIDE 0.9 % IV SOLN
150.0000 mg | Freq: Once | INTRAVENOUS | Status: AC
  Administered 2022-12-30: 150 mg via INTRAVENOUS
  Filled 2022-12-30: qty 150

## 2022-12-30 MED ORDER — MAGIC MOUTHWASH W/LIDOCAINE: 5.0000 mL | Freq: Four times a day (QID) | ORAL | 1 refills | Status: DC | PRN

## 2022-12-30 MED ORDER — OXYCODONE HCL 5 MG/5ML PO SOLN: 5.0000 mg | Freq: Four times a day (QID) | ORAL | 0 refills | Status: DC | PRN

## 2022-12-30 MED ORDER — POTASSIUM CHLORIDE IN NACL 20-0.9 MEQ/L-% IV SOLN
Freq: Once | INTRAVENOUS | Status: AC
  Filled 2022-12-30: qty 1000

## 2022-12-30 MED ORDER — VALACYCLOVIR HCL 500 MG PO TABS: ORAL_TABLET | ORAL | 1 refills | Status: DC

## 2022-12-30 MED ORDER — SODIUM CHLORIDE 0.9 % IV SOLN
10.0000 mg | Freq: Once | INTRAVENOUS | Status: AC
  Administered 2022-12-30: 10 mg via INTRAVENOUS
  Filled 2022-12-30: qty 10

## 2022-12-30 MED ORDER — SODIUM CHLORIDE 0.9 % IV SOLN
40.0000 mg/m2 | Freq: Once | INTRAVENOUS | Status: AC
  Administered 2022-12-30: 100 mg via INTRAVENOUS
  Filled 2022-12-30: qty 100

## 2022-12-30 NOTE — Progress Notes (Signed)
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 male with medical history significant for hypertension and hypercholesteremia presents to the diagnostic clinic for evaluation for left neck lymphadenopathy.  On review of the previous records, Jonathan Hudson presented to the urgent care on 09/04/2022 with a large painful left neck mass. It was initially felt to be infectious so treated with doxycycline with little improvement. He followed by with his PCP on 09/22/2022 who ordered a ultrasound on 10/01/2022 which revealed a hypoechoic solid mass in the left neck measuring 4.7 x 2.7 x 4.2 cm. CT neck followed on 10/06/2022 which showed bulky and necrotic adenopathy in the left level 2 neck.   On exam today, Jonathan Hudson reports that his energy levels are fairly stable. He is able to complete all his ADLs on his own. He is currently on FMLA while he undergoes workup. He reports having a good appetite and denies any weight loss. He denies nausea, vomiting or abdominal pain. His bowel habits are unchanged without any recurrent episodes of diarrhea or constipation. He denies easy bruising or signs of active bleeding. He reports having intermittent episodes of positional dizziness without any syncopal episodes.He denies fevers, chills, night sweats, shortness of breath, chest pain, cough, peripheral edema, neuropathy, pruritus or rash. He has no other complaints. Rest of the 10 point ROS is below.   INTERVAL HISTORY  Jonathan Hudson 57 y.o. male is connected via phone for continued evaluation and management of his newly diagnosed  metastatic head and neck squamous cell carcinoma. He is here to start cycle 4 of his treatment.   Patient was last seen by me on 12/17/2022 and he was doing well overall.   Patient reports he has been doing fairly well since our last visit. He complains of increased throat pain when swallowing food. He was in the ED on 12/22/2022 with low-grade fever of 100.8, shortness of breath, and nonproductive cough. He has been doing well since getting discharged from ED.  Patient's last day of radiation therapy is January 23, 2023.   He has been tolerating his treatment well without any new toxicities.   He denies fever, night sweats, abdominal pain, unexpected weight loss, fatigue, chest pain, back pain, or leg swelling. He does complain of occasional chills since our last visit. Patient also reports of blister-like skin rash near his left eye, which he noticed around 3-4 days ago. Patient also reports of diarrhea which started yesterday.   Patient has been having mild discharge from his tube, but no infection.   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[place])     SURGICAL HISTORY: Past Surgical History:  Procedure Laterality Date   COLONOSCOPY N/A 01/29/2017   Procedure: COLONOSCOPY;  Surgeon: Rogene Houston, MD;  [facility];  Service: Endoscopy;  Laterality: N/A;  730   COLONOSCOPY WITH PROPOFOL N/A 04/03/2022   Procedure: COLONOSCOPY WITH PROPOFOL;  Surgeon: Rogene Houston, MD;  Location: [facility];  Service: Endoscopy;  Laterality: N/A;  730   cyst removed from left wrist  EYE SURGERY     grafts N/A 10  years  ago   grafts to gums   HEMOSTASIS CLIP PLACEMENT  04/03/2022   Procedure: HEMOSTASIS CLIP PLACEMENT;  Surgeon: Rogene Houston, MD;  Location: [facility];  Service: Endoscopy;;   IR GASTROSTOMY TUBE MOD SED  12/15/2022   IR IMAGING GUIDED PORT INSERTION  12/15/2022    IR NASO G TUBE PLC W/FL W/RAD  12/15/2022   OPEN REDUCTION INTERNAL FIXATION (ORIF) DISTAL RADIAL FRACTURE Right 12/28/2013   Procedure: OPEN REDUCTION INTERNAL FIXATION (ORIF) DISTAL RADIAL FRACTURE;  Surgeon: Carole Civil, MD;  Location: [facility];  Service: Orthopedics;  Laterality: Right;   ORIF ULNAR FRACTURE Right 12/28/2013   Procedure: OPEN REDUCTION INTERNAL FIXATION (ORIF) ULNAR FRACTURE;  Surgeon: Carole Civil, MD;  Location: [facility];  Service: Orthopedics;  Laterality: Right;   POLYPECTOMY  04/03/2022   Procedure: POLYPECTOMY;  Surgeon: Rogene Houston, MD;  [facility]: [facility];  Service: Endoscopy;;   right knee Right teenager   right knee     SOCIAL HISTORY: Social History   Socioeconomic History   Marital status: Divorced    Spouse name: Not on file   Number of children: Not on file   Years of education: Not on file   Highest education level: Not on file  Occupational History   Not on file  Tobacco Use   Smoking status: Never   Smokeless tobacco: Never  Vaping Use   Vaping Use: Never used  Substance and Sexual Activity   Alcohol use: Yes    Comment: Occ  beer or wine    Drug use: No   Sexual activity: Not on file  Other Topics Concern   Not on file  Social History Narrative   Not on file   Social Determinants of Health   Financial Resource Strain: Low Risk  (12/03/2022)   Overall Financial Resource Strain (CARDIA)    Difficulty of Paying Living Expenses: Not hard at all  Food Insecurity: No Food Insecurity (12/03/2022)   Hunger Vital Sign    Worried About Running Out of Food in the Last Year: Never true    Ran Out of Food in the Last Year: Never true  Transportation Needs: No Transportation Needs (12/03/2022)   PRAPARE - Hydrologist (Medical): No    Lack of Transportation (Non-Medical): No  Physical Activity: Not on file  Stress: No Stress Concern Present (12/03/2022)   [place]    Feeling of Stress : Not at all  Social Connections: Not on file  Intimate Partner Violence: Not At Risk (12/03/2022)   Humiliation, Afraid, Rape, and Kick questionnaire    Fear of Current or Ex-Partner: No    Emotionally Abused: No    Physically Abused: No    Sexually Abused: No    FAMILY HISTORY: Family History  Problem Relation Age of Onset   Cancer Mother        small bowel spread to liver   Pancreatic cancer Father    Cancer Maternal Aunt    Brain cancer Maternal Uncle    Cancer Paternal Aunt    Breast cancer Cousin    Cancer Paternal Uncle     ALLERGIES:  has No Known Allergies.  MEDICATIONS:  Current Outpatient Medications  Medication Sig Dispense Refill   lidocaine (XYLOCAINE) 2 % solution Patient: Mix 1part 2% viscous lidocaine, 1part H20. Swish & swallow 53m  of diluted mixture, 34mn before meals and at bedtime, up to QID 200 mL 3   amLODipine (NORVASC) 10 MG tablet Take 10 mg by mouth daily.     atorvastatin (LIPITOR) 40 MG tablet Take 40 mg by mouth daily.     dexamethasone (DECADRON) 4 MG tablet Take 2 tablets daily x 3 days starting the day after cisplatin chemotherapy. Take with food. 30 tablet 1   doxycycline (VIBRAMYCIN) 100 MG capsule Take 1 capsule (100 mg total) by mouth 2 (two) times daily. 20 capsule 0   lansoprazole (PREVACID) 15 MG capsule Take 15 mg by mouth every morning.     lidocaine-prilocaine (EMLA) cream Apply to affected area once 30 g 3   Multiple Vitamin (MULTIVITAMIN WITH MINERALS) TABS tablet Take 1 tablet by mouth daily.     olmesartan (BENICAR) 40 MG tablet Take 40 mg by mouth daily.     ondansetron (ZOFRAN) 8 MG tablet Take 1 tablet (8 mg total) by mouth every 8 (eight) hours as needed for nausea or vomiting. Start on the third day after cisplatin. 30 tablet 1   oxyCODONE (OXY IR/ROXICODONE) 5 MG immediate release tablet Take 1 tablet (5 mg total) by mouth every 6 (six) hours as needed for severe pain  or moderate pain. 30 tablet 0   prochlorperazine (COMPAZINE) 10 MG tablet Take 1 tablet (10 mg total) by mouth every 6 (six) hours as needed (Nausea or vomiting). 30 tablet 1   senna-docusate (SENNA S) 8.6-50 MG tablet Take 2 tablets by mouth at bedtime. 60 tablet 2   sildenafil (VIAGRA) 100 MG tablet Take 100 mg by mouth daily as needed for erectile dysfunction. (Patient not taking: Reported on 11/18/2022)  5   No current facility-administered medications for this visit.   Facility-Administered Medications Ordered in Other Visits  Medication Dose Route Frequency Provider Last Rate Last Admin   CISplatin (PLATINOL) 100 mg in sodium chloride 0.9 % 500 mL chemo infusion  40 mg/m2 (Treatment Plan Recorded) Intravenous Once KBrunetta Genera MD       dexamethasone (DECADRON) 10 mg in sodium chloride 0.9 % 50 mL IVPB  10 mg Intravenous Once KBrunetta Genera MD       fosaprepitant (EMEND) 150 mg in sodium chloride 0.9 % 145 mL IVPB  150 mg Intravenous Once KBrunetta Genera MD       heparin lock flush 100 unit/mL  500 Units Intracatheter Once PRN KBrunetta Genera MD       magnesium sulfate IVPB 2 g 50 mL  2 g Intravenous Once KBrunetta Genera MD 50 mL/hr at 12/30/22 1033 2 g at 12/30/22 1033   palonosetron (ALOXI) injection 0.25 mg  0.25 mg Intravenous Once KBrunetta Genera MD       sodium chloride flush (NS) 0.9 % injection 10 mL  10 mL Intracatheter PRN KBrunetta Genera MD        REVIEW OF SYSTEMS:   10 Point review of Systems was done is negative except as noted above.  PHYSICAL EXAMINATION: VSS GENERAL:alert, in no acute distress and comfortable SKIN: no acute rashes, no significant lesions EYES: conjunctiva are pink and non-injected, sclera anicteric OROPHARYNX: MMM, no exudates, no oropharyngeal erythema or ulceration NECK: supple, no JVD LYMPH:  no palpable lymphadenopathy in the cervical, axillary or inguinal regions LUNGS: clear to auscultation b/l with  normal respiratory effort HEART: regular rate & rhythm ABDOMEN:  normoactive bowel sounds , non tender, not distended. Extremity: no pedal edema  PSYCH: alert & oriented x 3 with fluent speech NEURO: no focal motor/sensory deficits   LABORATORY DATA:  I have reviewed the data as listed .    Latest Ref Rng & Units 12/30/2022    8:44 AM 12/24/2022    9:58 AM 12/22/2022   11:40 PM  CBC  WBC 4.0 - 10.5 K/uL 9.4  13.9  16.6   Hemoglobin 13.0 - 17.0 g/dL 13.6  14.0  14.8   Hematocrit 39.0 - 52.0 % 38.5  40.3  43.7   Platelets 150 - 400 K/uL 315  255  268    .    Latest Ref Rng & Units 12/30/2022    8:44 AM 12/24/2022    9:58 AM 12/22/2022   11:40 PM  CMP  Glucose 70 - 99 mg/dL 98  107  114   BUN 6 - 20 mg/dL '15  22  16   '$ Creatinine 0.61 - 1.24 mg/dL 1.01  1.00  0.87   Sodium 135 - 145 mmol/L 134  133  132   Potassium 3.5 - 5.1 mmol/L 4.4  4.7  4.1   Chloride 98 - 111 mmol/L 98  97  97   CO2 22 - 32 mmol/L '29  28  25   '$ Calcium 8.9 - 10.3 mg/dL 8.7  8.7  8.9   Total Protein 6.5 - 8.1 g/dL   6.9   Total Bilirubin 0.3 - 1.2 mg/dL   0.8   Alkaline Phos 38 - 126 U/L   64   AST 15 - 41 U/L   17   ALT 0 - 44 U/L   26      SURGICAL PATHOLOGY  CASE: [phone]  PATIENT: Jonathan Hudson  Surgical Pathology Report   Specimen Submitted:  A. Lymph node, left neck   Clinical History: Necrotic node CT, possible squamous cell.  No history  of CA   DIAGNOSIS:  A. LYMPH NODE, LEFT NECK; ULTRASOUND-GUIDED BIOPSY:  - METASTATIC SQUAMOUS CELL CARCINOMA, P16 POSITIVE.  - NECROTIC DEBRIS.   Comment:  The carcinoma is positive for p63 and p16. This pattern of staining  supports the above diagnosis.  There is insufficient material for ancillary molecular testing.   . Lab Results  Component Value Date   LDH 198 (H) 10/13/2022   Component     Latest Ref Rng 10/13/2022  HIV Screen 4th Generation wRfx     Non Reactive  Non Reactive   HCV Ab     NON REACTIVE  NON REACTIVE    Hepatitis B Surface Ag     NON REACTIVE  NON REACTIVE   Hep B S Ab     NON REACTIVE  NON REACTIVE   Hep B Core Total Ab     NON REACTIVE  NON REACTIVE   CRP     <1.0 mg/dL <0.5   Sed Rate     0 - 16 mm/hr 7   LDH     98 - 192 U/L 198 (H)     Legend: (H) High   RADIOGRAPHIC STUDIES: I have personally reviewed the radiological images as listed and agreed with the findings in the report.  CT Angio Chest PE W and/or Wo Contrast  Result Date: 12/23/2022 CLINICAL DATA:  Pulmonary embolism suspected. High probability. Dry cough and fevers. Currently undergoing chemoradiation therapy for tonsillar cancer. Port placed 12/17/2022. PEG placed at that time. EXAM: CT ANGIOGRAPHY CHEST WITH CONTRAST TECHNIQUE: Multidetector CT imaging of the chest was performed using the standard  protocol during bolus administration of intravenous contrast. Multiplanar CT image reconstructions and MIPs were obtained to evaluate the vascular anatomy. RADIATION DOSE REDUCTION: This exam was performed according to the departmental dose-optimization program which includes automated exposure control, adjustment of the mA and/or kV according to patient size and/or use of iterative reconstruction technique. CONTRAST:  57m OMNIPAQUE IOHEXOL 350 MG/ML SOLN COMPARISON:  PET-CT 11/13/2022, portable chest yesterday, PA and lateral 12/13/2016 FINDINGS: Cardiovascular: There is mild cardiomegaly with a left chamber predominance, septal and left ventricular wall and papillary muscle hypertrophy. The pulmonary veins are decompressed. There are trace calcifications in the proximal LAD and proximal circumflex coronary arteries. The aorta and great vessels are normal caliber without evidence of significant plaques, aneurysm or dissection. Again noted is a right chest implanted port with IJ approach catheter terminating in the distal SVC. There is no subcutaneous abscess around the port. Pulmonary arteries are normal in caliber without  appreciable thromboemboli. Mediastinum/Nodes: No enlarged mediastinal, hilar, or axillary lymph nodes. Thyroid gland, trachea, and esophagus demonstrate no significant findings. Lungs/Pleura: Lungs are clear. No pleural effusion or pneumothorax. Upper Abdomen: Again noted are multiple hepatic cysts and additional too small to characterize scattered subcentimeter hypodensities, unchanged. No acute findings. Musculoskeletal: No chest wall abnormality. No acute or significant osseous findings. Review of the MIP images confirms the above findings. IMPRESSION: 1. No evidence of a pulmonary embolus. 2. Mild cardiomegaly with a left chamber predominance, septal and left ventricular wall and papillary muscle hypertrophy. 3. Trace coronary artery calcifications in the proximal LAD and proximal circumflex coronary arteries. No significant or appreciable aortic plaques. 4. No other significant findings.  Multiple hepatic cysts. Aortic Atherosclerosis (ICD10-I70.0). Electronically Signed   By: KTelford NabM.D.   On: 12/23/2022 02:33   DG Chest Portable 1 View  Result Date: 12/22/2022 CLINICAL DATA:  Shortness of breath, dry cough and fever. EXAM: PORTABLE CHEST 1 VIEW COMPARISON:  PA and lateral 12/13/16. FINDINGS: The heart size and mediastinal contours are within normal limits. Both lungs are clear of infiltrates. A calcified left mid perihilar granuloma is again noted. The visualized skeletal structures are unremarkable. Not seen previously, there is an implanted right chest port with the IJ approach catheter terminating in the distal SVC. IMPRESSION: No active disease. Right chest port in place. Old granulomatous disease. Electronically Signed   By: KTelford NabM.D.   On: 12/22/2022 23:27   IR [facility] W/FL W/Rad  Result Date: 12/15/2022 INDICATION: 57year old male with history of head neck cancer presenting for Port-A-Cath and gastrostomy tube placements. EXAM: 1. IMPLANTED PORT A CATH PLACEMENT WITH  ULTRASOUND AND FLUOROSCOPIC GUIDANCE 2. Fluoroscopic guided nasogastric tube placement 3. Fluoroscopic guided percutaneous gastrostomy tube placement COMPARISON:  None Available. MEDICATIONS: Ancef 2 gm IV; The antibiotic was administered within an appropriate time interval prior to skin puncture. ANESTHESIA/SEDATION: Moderate (conscious) sedation was employed during this procedure. A total of Versed 4 mg and Fentanyl 200 mcg was administered intravenously. Moderate Sedation Time: 31 minutes. The patient's level of consciousness and vital signs were monitored continuously by radiology nursing throughout the procedure under my direct supervision. CONTRAST:  None FLUOROSCOPY TIME:  Eleven mGy COMPLICATIONS: None immediate. PROCEDURE: The procedure, risks, benefits, and alternatives were explained to the patient. Questions regarding the procedure were encouraged and answered. The patient understands and consents to the procedure. The right neck, chest, and upper abdomen were prepped with chlorhexidine in a sterile fashion, and a sterile drape was applied covering the operative field.  Maximum barrier sterile technique with sterile gowns and gloves were used for the procedure. A timeout was performed prior to the initiation of the procedure. Ultrasound was used to examine the jugular vein which was compressible and free of internal echoes. A skin marker was used to demarcate the planned venotomy and port pocket incision sites. Local anesthesia was provided to these sites and the subcutaneous tunnel track with 1% lidocaine with 1:100,000 epinephrine. A small incision was created at the jugular access site and blunt dissection was performed of the subcutaneous tissues. Under ultrasound guidance, the jugular vein was accessed with a 21 ga micropuncture needle and an 0.018" wire was inserted to the superior vena cava. Real-time ultrasound guidance was utilized for vascular access including the acquisition of a permanent  ultrasound image documenting patency of the accessed vessel. A 5 Fr micopuncture set was then used, through which a 0.035" Rosen wire was passed under fluoroscopic guidance into the inferior vena cava. An 8 Fr dilator was then placed over the wire. A subcutaneous port pocket was then created along the upper chest wall utilizing a combination of sharp and blunt dissection. The pocket was irrigated with sterile saline, packed with gauze, and observed for hemorrhage. A single lumen "ISP" sized power injectable port was chosen for placement. The 8 Fr catheter was tunneled from the port pocket site to the venotomy incision. The port was placed in the pocket. The external catheter was trimmed to appropriate length. The dilator was exchanged for an 8 Fr peel-away sheath under fluoroscopic guidance. The catheter was then placed through the sheath and the sheath was removed. Final catheter positioning was confirmed and documented with a fluoroscopic spot radiograph. The port was accessed with a Huber needle, aspirated, and flushed with heparinized saline. The deep dermal layer of the port pocket incision was closed with interrupted 3-0 Vicryl suture. Dermabond was then placed over the port pocket and neck incisions. Attention was then turned toward placement of the gastrostomy tube. Pre-procedure abdominal film confirmed visualization of the transverse colon. An angled 5-French catheter was passed through the nares into the stomach. The patient was prepped and draped in usual sterile fashion. The stomach was insufflated with air via the indwelling nasogastric tube. Under fluoroscopy, a puncture site was selected and local analgesia achieved with 1% lidocaine infiltrated subcutaneously. Under fluoroscopic guidance, a gastropexy needle was passed into the stomach and the T-bar suture was released. Entry into the stomach was confirmed with fluoroscopy, aspiration of air, and injection of contrast material. This was repeated  with an additional gastropexy suture (for a total of 2 fasteners). At the center of these gastropexy sutures, a dermatotomy was performed. An 18 gauge needle was passed into the stomach at the site of this dermatotomy, and position within the gastric lumen again confirmed under fluoroscopy using aspiration of air and contrast injection. An Amplatz guidewire was passed through this needle and intraluminal placement within the stomach was confirmed by fluoroscopy. The needle was removed. Over the guidewire, the percutaneous tract was dilated using a 10 mm non-compliant balloon. The balloon was deflated, then pushed into the gastric lumen followed in concert by the 20 Fr gastrostomy tube. The retention balloon of the percutaneous gastrostomy tube was inflated with 10 mL of sterile water. The tube was withdrawn until the retention balloon was at the edge of the gastric lumen. The external bumper was brought to the abdominal wall. Contrast was injected through the gastrostomy tube, confirming intraluminal positioning. The patient tolerated the procedures  well without any immediate post-procedural complications. IMPRESSION: 1. Successful placement of a power injectable Port-A-Cath via the right internal jugular vein. The catheter is ready for immediate use. 2. Successful placement of 20 French balloon retention percutaneous gastrostomy tube. PLAN: The patient will be scheduled for 6 months routine gastrostomy tube exchange. Ruthann Cancer, MD [facility] Electronically Signed   By: Ruthann Cancer M.D.   On: 12/15/2022 11:52   IR IMAGING GUIDED PORT INSERTION  Result Date: 12/15/2022 INDICATION: 57 year old male with history of head neck cancer presenting for Port-A-Cath and gastrostomy tube placements. EXAM: 1. IMPLANTED PORT A CATH PLACEMENT WITH ULTRASOUND AND FLUOROSCOPIC GUIDANCE 2. Fluoroscopic guided nasogastric tube placement 3. Fluoroscopic guided  percutaneous gastrostomy tube placement COMPARISON:  None Available. MEDICATIONS: Ancef 2 gm IV; The antibiotic was administered within an appropriate time interval prior to skin puncture. ANESTHESIA/SEDATION: Moderate (conscious) sedation was employed during this procedure. A total of Versed 4 mg and Fentanyl 200 mcg was administered intravenously. Moderate Sedation Time: 31 minutes. The patient's level of consciousness and vital signs were monitored continuously by radiology nursing throughout the procedure under my direct supervision. CONTRAST:  None FLUOROSCOPY TIME:  Eleven mGy COMPLICATIONS: None immediate. PROCEDURE: The procedure, risks, benefits, and alternatives were explained to the patient. Questions regarding the procedure were encouraged and answered. The patient understands and consents to the procedure. The right neck, chest, and upper abdomen were prepped with chlorhexidine in a sterile fashion, and a sterile drape was applied covering the operative field. Maximum barrier sterile technique with sterile gowns and gloves were used for the procedure. A timeout was performed prior to the initiation of the procedure. Ultrasound was used to examine the jugular vein which was compressible and free of internal echoes. A skin marker was used to demarcate the planned venotomy and port pocket incision sites. Local anesthesia was provided to these sites and the subcutaneous tunnel track with 1% lidocaine with 1:100,000 epinephrine. A small incision was created at the jugular access site and blunt dissection was performed of the subcutaneous tissues. Under ultrasound guidance, the jugular vein was accessed with a 21 ga micropuncture needle and an 0.018" wire was inserted to the superior vena cava. Real-time ultrasound guidance was utilized for vascular access including the acquisition of a permanent ultrasound image documenting patency of the accessed vessel. A 5 Fr micopuncture set was then used, through which  a 0.035" Rosen wire was passed under fluoroscopic guidance into the inferior vena cava. An 8 Fr dilator was then placed over the wire. A subcutaneous port pocket was then created along the upper chest wall utilizing a combination of sharp and blunt dissection. The pocket was irrigated with sterile saline, packed with gauze, and observed for hemorrhage. A single lumen "ISP" sized power injectable port was chosen for placement. The 8 Fr catheter was tunneled from the port pocket site to the venotomy incision. The port was placed in the pocket. The external catheter was trimmed to appropriate length. The dilator was exchanged for an 8 Fr peel-away sheath under fluoroscopic guidance. The catheter was then placed through the sheath and the sheath was removed. Final catheter positioning was confirmed and documented with a fluoroscopic spot radiograph. The port was accessed with a Huber needle, aspirated, and flushed with heparinized saline. The deep dermal layer of the port pocket incision was closed with interrupted 3-0 Vicryl suture. Dermabond was then placed over the port pocket and neck incisions. Attention was then turned toward placement of  the gastrostomy tube. Pre-procedure abdominal film confirmed visualization of the transverse colon. An angled 5-French catheter was passed through the nares into the stomach. The patient was prepped and draped in usual sterile fashion. The stomach was insufflated with air via the indwelling nasogastric tube. Under fluoroscopy, a puncture site was selected and local analgesia achieved with 1% lidocaine infiltrated subcutaneously. Under fluoroscopic guidance, a gastropexy needle was passed into the stomach and the T-bar suture was released. Entry into the stomach was confirmed with fluoroscopy, aspiration of air, and injection of contrast material. This was repeated with an additional gastropexy suture (for a total of 2 fasteners). At the center of these gastropexy sutures, a  dermatotomy was performed. An 18 gauge needle was passed into the stomach at the site of this dermatotomy, and position within the gastric lumen again confirmed under fluoroscopy using aspiration of air and contrast injection. An Amplatz guidewire was passed through this needle and intraluminal placement within the stomach was confirmed by fluoroscopy. The needle was removed. Over the guidewire, the percutaneous tract was dilated using a 10 mm non-compliant balloon. The balloon was deflated, then pushed into the gastric lumen followed in concert by the 20 Fr gastrostomy tube. The retention balloon of the percutaneous gastrostomy tube was inflated with 10 mL of sterile water. The tube was withdrawn until the retention balloon was at the edge of the gastric lumen. The external bumper was brought to the abdominal wall. Contrast was injected through the gastrostomy tube, confirming intraluminal positioning. The patient tolerated the procedures well without any immediate post-procedural complications. IMPRESSION: 1. Successful placement of a power injectable Port-A-Cath via the right internal jugular vein. The catheter is ready for immediate use. 2. Successful placement of 20 French balloon retention percutaneous gastrostomy tube. PLAN: The patient will be scheduled for 6 months routine gastrostomy tube exchange. Ruthann Cancer, MD [facility] Radiology Electronically Signed   By: Ruthann Cancer M.D.   On: 12/15/2022 11:01   IR Gastrostomy Tube  Result Date: 12/15/2022 INDICATION: 57 year old male with history of head neck cancer presenting for Port-A-Cath and gastrostomy tube placements. EXAM: 1. IMPLANTED PORT A CATH PLACEMENT WITH ULTRASOUND AND FLUOROSCOPIC GUIDANCE 2. Fluoroscopic guided nasogastric tube placement 3. Fluoroscopic guided percutaneous gastrostomy tube placement COMPARISON:  None Available. MEDICATIONS: Ancef 2 gm IV; The antibiotic was administered  within an appropriate time interval prior to skin puncture. ANESTHESIA/SEDATION: Moderate (conscious) sedation was employed during this procedure. A total of Versed 4 mg and Fentanyl 200 mcg was administered intravenously. Moderate Sedation Time: 31 minutes. The patient's level of consciousness and vital signs were monitored continuously by radiology nursing throughout the procedure under my direct supervision. CONTRAST:  None FLUOROSCOPY TIME:  Eleven mGy COMPLICATIONS: None immediate. PROCEDURE: The procedure, risks, benefits, and alternatives were explained to the patient. Questions regarding the procedure were encouraged and answered. The patient understands and consents to the procedure. The right neck, chest, and upper abdomen were prepped with chlorhexidine in a sterile fashion, and a sterile drape was applied covering the operative field. Maximum barrier sterile technique with sterile gowns and gloves were used for the procedure. A timeout was performed prior to the initiation of the procedure. Ultrasound was used to examine the jugular vein which was compressible and free of internal echoes. A skin marker was used to demarcate the planned venotomy and port pocket incision sites. Local anesthesia was provided to these sites and the subcutaneous tunnel track with 1% lidocaine with 1:100,000 epinephrine. A small  incision was created at the jugular access site and blunt dissection was performed of the subcutaneous tissues. Under ultrasound guidance, the jugular vein was accessed with a 21 ga micropuncture needle and an 0.018" wire was inserted to the superior vena cava. Real-time ultrasound guidance was utilized for vascular access including the acquisition of a permanent ultrasound image documenting patency of the accessed vessel. A 5 Fr micopuncture set was then used, through which a 0.035" Rosen wire was passed under fluoroscopic guidance into the inferior vena cava. An 8 Fr dilator was then placed over the  wire. A subcutaneous port pocket was then created along the upper chest wall utilizing a combination of sharp and blunt dissection. The pocket was irrigated with sterile saline, packed with gauze, and observed for hemorrhage. A single lumen "ISP" sized power injectable port was chosen for placement. The 8 Fr catheter was tunneled from the port pocket site to the venotomy incision. The port was placed in the pocket. The external catheter was trimmed to appropriate length. The dilator was exchanged for an 8 Fr peel-away sheath under fluoroscopic guidance. The catheter was then placed through the sheath and the sheath was removed. Final catheter positioning was confirmed and documented with a fluoroscopic spot radiograph. The port was accessed with a Huber needle, aspirated, and flushed with heparinized saline. The deep dermal layer of the port pocket incision was closed with interrupted 3-0 Vicryl suture. Dermabond was then placed over the port pocket and neck incisions. Attention was then turned toward placement of the gastrostomy tube. Pre-procedure abdominal film confirmed visualization of the transverse colon. An angled 5-French catheter was passed through the nares into the stomach. The patient was prepped and draped in usual sterile fashion. The stomach was insufflated with air via the indwelling nasogastric tube. Under fluoroscopy, a puncture site was selected and local analgesia achieved with 1% lidocaine infiltrated subcutaneously. Under fluoroscopic guidance, a gastropexy needle was passed into the stomach and the T-bar suture was released. Entry into the stomach was confirmed with fluoroscopy, aspiration of air, and injection of contrast material. This was repeated with an additional gastropexy suture (for a total of 2 fasteners). At the center of these gastropexy sutures, a dermatotomy was performed. An 18 gauge needle was passed into the stomach at the site of this dermatotomy, and position within the  gastric lumen again confirmed under fluoroscopy using aspiration of air and contrast injection. An Amplatz guidewire was passed through this needle and intraluminal placement within the stomach was confirmed by fluoroscopy. The needle was removed. Over the guidewire, the percutaneous tract was dilated using a 10 mm non-compliant balloon. The balloon was deflated, then pushed into the gastric lumen followed in concert by the 20 Fr gastrostomy tube. The retention balloon of the percutaneous gastrostomy tube was inflated with 10 mL of sterile water. The tube was withdrawn until the retention balloon was at the edge of the gastric lumen. The external bumper was brought to the abdominal wall. Contrast was injected through the gastrostomy tube, confirming intraluminal positioning. The patient tolerated the procedures well without any immediate post-procedural complications. IMPRESSION: 1. Successful placement of a power injectable Port-A-Cath via the right internal jugular vein. The catheter is ready for immediate use. 2. Successful placement of 20 French balloon retention percutaneous gastrostomy tube. PLAN: The patient will be scheduled for 6 months routine gastrostomy tube exchange. Ruthann Cancer, MD [facility] Radiology Electronically Signed   By: Ruthann Cancer M.D.   On: 12/15/2022 11:01  ASSESSMENT & PLAN  Jonathan Hudson is a 57 y.o. 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 Primary site unclear at this time but is likely oropharyngeal given HPV positivity. Undergoing IMRT and weekly Cisplatin #2  Radiation mucositis grade 2 #3 G-tube in place with mild serous/seropurulent discharge.  No induration or signs of infection. #4 mild protein calorie malnutrition -using tube feeding and hydration regularly at this time. #5 early herpes zoster along the cheek. PLAN: -Discussed lab results from today,  12/30/2022, with the patient. CBC shows slightly decreased hematocrit of 38.5. CMP is stable. -Will prescribe liquid oxycodone as needed for radiation mucositis related pain since he is having difficulties swallowing his pills. - magic mouth wash with lidocaine as needed for oral and throat pain to swish and swallow.  -Patient has tolerated his treatment with cisplatin well without any toxicities.  -Patient can proceed with Cisplatin treatment as scheduled without any dose modification.  -Will prescribe anti-bacterial ointment to be used around the tube  -Prescribed Valtrex for treatment and prevention in the context of mild early signs of herpes zoster rash on the cheek.  No ocular involvement.  Will need to monitor closely and he is aware that he should call us if this worsens. -Answered all of patient's questions.  -Recommended to continue to stay well-hydrated and eat well.  -Close follow-up with dietitian to optimize tube feeding. FOLLOW-UP: We shall be following him weekly along with his chemotherapy  The total time spent in the appointment was 40 minutes* .  All of the patient's questions were answered with apparent satisfaction. The patient knows to call the clinic with any problems, questions or concerns.   Sullivan Lone MD MS AAHIVMS [facility] Hematology/Oncology Physician [facility]  .*Total Encounter Time as defined by the Centers for Medicare and Medicaid Services includes, in addition to the face-to-face time of a patient visit (documented in the note above) non-face-to-face time: obtaining and reviewing outside history, ordering and reviewing medications, tests or procedures, care coordination (communications with other health care professionals or caregivers) and documentation in the medical record.   I, Cleda Mccreedy, am acting as a Education administrator for Sullivan Lone, MD. .I have reviewed the above documentation for accuracy and completeness, and I agree with the  above. Brunetta Genera MD

## 2022-12-30 NOTE — Progress Notes (Signed)
Nutrition follow-up completed with patient during infusion for SCC of head and neck with metastases to left cervical lymph node.  He gets weekly cisplatin and he has completed 17 out of 35 planned radiation treatments.  He is status post PEG on February 12.  Last weight documented was 235.8 pounds on February 26.  This is decreased from 247 pounds 9.6 ounces on February 14.  Labs reviewed.  Noted sodium 134  He continues to have dysphagia with thickened saliva.  Patient reports it is much more difficult for him to eat by mouth.  He is still drinking liquids but finds solid food either does not taste very good or it is too painful to swallow.  He has thick saliva and has been doing baking soda and salt water rinses frequently.    Reports he drank Ensure Plus very quickly and experienced diarrhea after.  He clarified he does not get diarrhea after tube feeding. He also reports diarrhea occurred after taking a Dulcolax but his stools are now soft and formed.  States he is using 4-5 cartons of Osmolite 1.5 via PEG. States he uses 7 bottles (16 oz) of water every day either through the tube or by mouth.  He denies other nutrition impact symptoms.  Estimated nutrition needs: 2725-3052 cal, 141-163 g protein, greater than 2.7 L fluid.  Nutrition diagnosis: Inadequate oral intake continues.  Intervention: Educated patient to work to increase Osmolite 1.5 to goal of 2 cartons 4 times daily with 90 mL of water before and after bolus feedings. Continue 45 mL Prosource TF twice a day.  Flush tube with 30 mL of water before and after. Continue to drink or flush feeding tube with an additional 2 cups of water but be sure to do this between tube feeding so as not to affect the volume of tolerated Osmolite 1.5. Educated patient to contact home health for tube feeding delivery.  Regimen provides 1920 mL/day, 2920 cal, 141 g protein, 2708 mL free water.  This is 100% estimated nutrition needs.  Monitoring,  evaluation, goals: Patient will tolerate adequate calories protein and fluids to meet greater than 90% estimated nutrition needs for weight maintenance.  Next visit: Tuesday, March 5 with Vinnie Level during infusion.  **Disclaimer: This note was dictated with voice recognition software. Similar sounding words can inadvertently be transcribed and this note may contain transcription errors which may not have been corrected upon publication of note.**

## 2022-12-30 NOTE — Patient Instructions (Signed)
[facility]  Discharge Instructions: Thank you for choosing [facility] to provide your oncology and hematology care.   If you have a lab appointment with the [facility], please go directly to the [facility] and check in at the registration area.   Wear comfortable clothing and clothing appropriate for easy access to any Portacath or PICC line.   We strive to give you quality time with your provider. You may need to reschedule your appointment if you arrive late (15 or more minutes).  Arriving late affects you and other patients whose appointments are after yours.  Also, if you miss three or more appointments without notifying the office, you may be dismissed from the clinic at the provider's discretion.      For prescription refill requests, have your pharmacy contact our office and allow 72 hours for refills to be completed.    Today you received the following chemotherapy and/or immunotherapy agents: Cisplatin.      To help prevent nausea and vomiting after your treatment, we encourage you to take your nausea medication as directed.  BELOW ARE SYMPTOMS THAT SHOULD BE REPORTED IMMEDIATELY: *FEVER GREATER THAN 100.4 F (38 C) OR HIGHER *CHILLS OR SWEATING *NAUSEA AND VOMITING THAT IS NOT CONTROLLED WITH YOUR NAUSEA MEDICATION *UNUSUAL SHORTNESS OF BREATH *UNUSUAL BRUISING OR BLEEDING *URINARY PROBLEMS (pain or burning when urinating, or frequent urination) *BOWEL PROBLEMS (unusual diarrhea, constipation, pain near the anus) TENDERNESS IN MOUTH AND THROAT WITH OR WITHOUT PRESENCE OF ULCERS (sore throat, sores in mouth, or a toothache) UNUSUAL RASH, SWELLING OR PAIN  UNUSUAL VAGINAL DISCHARGE OR ITCHING   Items with * indicate a potential emergency and should be followed up as soon as possible or go to the Emergency Department if any problems should occur.  Please show the CHEMOTHERAPY ALERT CARD or IMMUNOTHERAPY ALERT CARD at  check-in to the Emergency Department and triage nurse.  Should you have questions after your visit or need to cancel or reschedule your appointment, please contact [facility]  Dept: [phone]  and follow the prompts.  Office hours are 8:00 a.m. to 4:30 p.m. Monday - Friday. Please note that voicemails left after 4:00 p.m. may not be returned until the following business day.  We are closed weekends and major holidays. You have access to a nurse at all times for urgent questions. Please call the main number to the clinic Dept: [phone] and follow the prompts.   For any non-urgent questions, you may also contact your provider using MyChart. We now offer e-Visits for anyone 85 and older to request care online for non-urgent symptoms. For details visit [email or web address].   Also download the MyChart app! Go to the app store, search "MyChart", open the app, select [facility], and log in with your MyChart username and password.

## 2022-12-31 ENCOUNTER — Other Ambulatory Visit: Payer: Self-pay

## 2022-12-31 ENCOUNTER — Ambulatory Visit
Admission: RE | Admit: 2022-12-31 | Discharge: 2022-12-31 | Disposition: A | Discharge status: Home / Self Care | Payer: 59 | Source: Ambulatory Visit | Attending: Radiation Oncology | Admitting: Radiation Oncology

## 2022-12-31 DIAGNOSIS — Z5111 Encounter for antineoplastic chemotherapy: Secondary | ICD-10-CM | POA: Diagnosis not present

## 2022-12-31 LAB — RAD ONC ARIA SESSION SUMMARY
Course Elapsed Days: 23
Plan Fractions Treated to Date: 18
Plan Prescribed Dose Per Fraction: 2 Gy
Plan Total Fractions Prescribed: 35
Plan Total Prescribed Dose: 70 Gy
Reference Point Dosage Given to Date: 36 Gy
Reference Point Session Dosage Given: 2 Gy
Session Number: 18

## 2023-01-01 ENCOUNTER — Ambulatory Visit
Admission: RE | Admit: 2023-01-01 | Discharge: 2023-01-01 | Disposition: A | Discharge status: Home / Self Care | Payer: 59 | Source: Ambulatory Visit | Attending: Radiation Oncology | Admitting: Radiation Oncology

## 2023-01-01 ENCOUNTER — Other Ambulatory Visit: Payer: Self-pay

## 2023-01-01 DIAGNOSIS — Z5111 Encounter for antineoplastic chemotherapy: Secondary | ICD-10-CM | POA: Diagnosis not present

## 2023-01-01 LAB — RAD ONC ARIA SESSION SUMMARY
Course Elapsed Days: 24
Plan Fractions Treated to Date: 19
Plan Prescribed Dose Per Fraction: 2 Gy
Plan Total Fractions Prescribed: 35
Plan Total Prescribed Dose: 70 Gy
Reference Point Dosage Given to Date: 38 Gy
Reference Point Session Dosage Given: 2 Gy
Session Number: 19

## 2023-01-02 ENCOUNTER — Other Ambulatory Visit: Payer: Self-pay

## 2023-01-02 ENCOUNTER — Ambulatory Visit
Admission: RE | Admit: 2023-01-02 | Discharge: 2023-01-02 | Disposition: A | Discharge status: Home / Self Care | Payer: 59 | Source: Ambulatory Visit | Attending: Radiation Oncology | Admitting: Radiation Oncology

## 2023-01-02 DIAGNOSIS — Z79891 Long term (current) use of opiate analgesic: Secondary | ICD-10-CM | POA: Diagnosis not present

## 2023-01-02 DIAGNOSIS — Z79899 Other long term (current) drug therapy: Secondary | ICD-10-CM | POA: Diagnosis not present

## 2023-01-02 DIAGNOSIS — Z5111 Encounter for antineoplastic chemotherapy: Secondary | ICD-10-CM | POA: Diagnosis not present

## 2023-01-02 DIAGNOSIS — E441 Mild protein-calorie malnutrition: Secondary | ICD-10-CM | POA: Diagnosis not present

## 2023-01-02 DIAGNOSIS — C770 Secondary and unspecified malignant neoplasm of lymph nodes of head, face and neck: Secondary | ICD-10-CM | POA: Diagnosis not present

## 2023-01-02 DIAGNOSIS — E7800 Pure hypercholesterolemia, unspecified: Secondary | ICD-10-CM | POA: Diagnosis not present

## 2023-01-02 DIAGNOSIS — G893 Neoplasm related pain (acute) (chronic): Secondary | ICD-10-CM | POA: Diagnosis not present

## 2023-01-02 DIAGNOSIS — C090 Malignant neoplasm of tonsillar fossa: Secondary | ICD-10-CM | POA: Insufficient documentation

## 2023-01-02 DIAGNOSIS — Z931 Gastrostomy status: Secondary | ICD-10-CM | POA: Diagnosis not present

## 2023-01-02 DIAGNOSIS — R21 Rash and other nonspecific skin eruption: Secondary | ICD-10-CM | POA: Diagnosis not present

## 2023-01-02 DIAGNOSIS — I10 Essential (primary) hypertension: Secondary | ICD-10-CM | POA: Diagnosis not present

## 2023-01-02 DIAGNOSIS — Z87891 Personal history of nicotine dependence: Secondary | ICD-10-CM | POA: Diagnosis not present

## 2023-01-02 LAB — RAD ONC ARIA SESSION SUMMARY
Course Elapsed Days: 25
Plan Fractions Treated to Date: 20
Plan Prescribed Dose Per Fraction: 2 Gy
Plan Total Fractions Prescribed: 35
Plan Total Prescribed Dose: 70 Gy
Reference Point Dosage Given to Date: 40 Gy
Reference Point Session Dosage Given: 2 Gy
Session Number: 20

## 2023-01-05 ENCOUNTER — Encounter: Payer: Self-pay | Admitting: Hematology

## 2023-01-05 ENCOUNTER — Other Ambulatory Visit: Payer: Self-pay

## 2023-01-05 ENCOUNTER — Ambulatory Visit
Admission: RE | Admit: 2023-01-05 | Discharge: 2023-01-05 | Disposition: A | Discharge status: Home / Self Care | Payer: 59 | Source: Ambulatory Visit | Attending: Radiation Oncology | Admitting: Radiation Oncology

## 2023-01-05 DIAGNOSIS — Z5111 Encounter for antineoplastic chemotherapy: Secondary | ICD-10-CM | POA: Diagnosis not present

## 2023-01-05 LAB — RAD ONC ARIA SESSION SUMMARY
Course Elapsed Days: 28
Plan Fractions Treated to Date: 21
Plan Prescribed Dose Per Fraction: 2 Gy
Plan Total Fractions Prescribed: 35
Plan Total Prescribed Dose: 70 Gy
Reference Point Dosage Given to Date: 42 Gy
Reference Point Session Dosage Given: 2 Gy
Session Number: 21

## 2023-01-05 MED FILL — Dexamethasone Sodium Phosphate Inj 100 MG/10ML: INTRAMUSCULAR | Qty: 1 | Status: AC

## 2023-01-05 MED FILL — Fosaprepitant Dimeglumine For IV Infusion 150 MG (Base Eq): INTRAVENOUS | Qty: 5 | Status: AC

## 2023-01-06 ENCOUNTER — Other Ambulatory Visit: Payer: Self-pay

## 2023-01-06 ENCOUNTER — Inpatient Hospital Stay: Payer: 59 | Attending: Physician Assistant | Admitting: Dietician

## 2023-01-06 ENCOUNTER — Inpatient Hospital Stay: Payer: 59

## 2023-01-06 ENCOUNTER — Ambulatory Visit
Admission: RE | Admit: 2023-01-06 | Discharge: 2023-01-06 | Disposition: A | Discharge status: Home / Self Care | Payer: 59 | Source: Ambulatory Visit | Attending: Radiation Oncology | Admitting: Radiation Oncology

## 2023-01-06 ENCOUNTER — Other Ambulatory Visit: Payer: Self-pay | Admitting: Radiation Oncology

## 2023-01-06 ENCOUNTER — Inpatient Hospital Stay (HOSPITAL_BASED_OUTPATIENT_CLINIC_OR_DEPARTMENT_OTHER): Payer: 59 | Admitting: Hematology

## 2023-01-06 VITALS — BP 133/71 | HR 58 | Temp 98.1°F | Resp 18 | Wt 230.5 lb

## 2023-01-06 DIAGNOSIS — C770 Secondary and unspecified malignant neoplasm of lymph nodes of head, face and neck: Secondary | ICD-10-CM | POA: Insufficient documentation

## 2023-01-06 DIAGNOSIS — E441 Mild protein-calorie malnutrition: Secondary | ICD-10-CM | POA: Insufficient documentation

## 2023-01-06 DIAGNOSIS — C090 Malignant neoplasm of tonsillar fossa: Secondary | ICD-10-CM | POA: Insufficient documentation

## 2023-01-06 DIAGNOSIS — E7800 Pure hypercholesterolemia, unspecified: Secondary | ICD-10-CM | POA: Insufficient documentation

## 2023-01-06 DIAGNOSIS — Z5111 Encounter for antineoplastic chemotherapy: Secondary | ICD-10-CM | POA: Insufficient documentation

## 2023-01-06 DIAGNOSIS — Z79891 Long term (current) use of opiate analgesic: Secondary | ICD-10-CM | POA: Insufficient documentation

## 2023-01-06 DIAGNOSIS — Z87891 Personal history of nicotine dependence: Secondary | ICD-10-CM | POA: Insufficient documentation

## 2023-01-06 DIAGNOSIS — G893 Neoplasm related pain (acute) (chronic): Secondary | ICD-10-CM | POA: Insufficient documentation

## 2023-01-06 DIAGNOSIS — R21 Rash and other nonspecific skin eruption: Secondary | ICD-10-CM | POA: Insufficient documentation

## 2023-01-06 DIAGNOSIS — Z79899 Other long term (current) drug therapy: Secondary | ICD-10-CM | POA: Insufficient documentation

## 2023-01-06 DIAGNOSIS — Z931 Gastrostomy status: Secondary | ICD-10-CM | POA: Insufficient documentation

## 2023-01-06 DIAGNOSIS — Z95828 Presence of other vascular implants and grafts: Secondary | ICD-10-CM

## 2023-01-06 DIAGNOSIS — I10 Essential (primary) hypertension: Secondary | ICD-10-CM | POA: Insufficient documentation

## 2023-01-06 LAB — BASIC METABOLIC PANEL - CANCER CENTER ONLY
Anion gap: 6 (ref 5–15)
BUN: 16 mg/dL (ref 6–20)
CO2: 30 mmol/L (ref 22–32)
Calcium: 8.9 mg/dL (ref 8.9–10.3)
Chloride: 100 mmol/L (ref 98–111)
Creatinine: 1.04 mg/dL (ref 0.61–1.24)
GFR, Estimated: >60 mL/min (ref >60)
Glucose, Bld: 120 mg/dL — ABNORMAL HIGH (ref 70–99)
Potassium: 4.3 mmol/L (ref 3.5–5.1)
Sodium: 136 mmol/L (ref 135–145)

## 2023-01-06 LAB — CBC WITH DIFFERENTIAL (CANCER CENTER ONLY)
Abs Immature Granulocytes: 0.01 10*3/uL (ref 0.00–0.07)
Basophils Absolute: 0 10*3/uL (ref 0.0–0.1)
Basophils Relative: 0 %
Eosinophils Absolute: 0 10*3/uL (ref 0.0–0.5)
Eosinophils Relative: 0 %
HCT: 36.4 % — ABNORMAL LOW (ref 39.0–52.0)
Hemoglobin: 12.4 g/dL — ABNORMAL LOW (ref 13.0–17.0)
Immature Granulocytes: 0 %
Lymphocytes Relative: 16 %
Lymphs Abs: 0.8 10*3/uL (ref 0.7–4.0)
MCH: 30.8 pg (ref 26.0–34.0)
MCHC: 34.1 g/dL (ref 30.0–36.0)
MCV: 90.3 fL (ref 80.0–100.0)
Monocytes Absolute: 0.5 10*3/uL (ref 0.1–1.0)
Monocytes Relative: 9 %
Neutro Abs: 3.5 10*3/uL (ref 1.7–7.7)
Neutrophils Relative %: 75 %
Platelet Count: 216 10*3/uL (ref 150–400)
RBC: 4.03 MIL/uL — ABNORMAL LOW (ref 4.22–5.81)
RDW: 13.4 % (ref 11.5–15.5)
WBC Count: 4.8 10*3/uL (ref 4.0–10.5)
nRBC: 0 % (ref 0.0–0.2)

## 2023-01-06 LAB — RAD ONC ARIA SESSION SUMMARY
Course Elapsed Days: 29
Plan Fractions Treated to Date: 22
Plan Prescribed Dose Per Fraction: 2 Gy
Plan Total Fractions Prescribed: 35
Plan Total Prescribed Dose: 70 Gy
Reference Point Dosage Given to Date: 44 Gy
Reference Point Session Dosage Given: 2 Gy
Session Number: 22

## 2023-01-06 LAB — MAGNESIUM: Magnesium: 2 mg/dL (ref 1.7–2.4)

## 2023-01-06 MED ORDER — SODIUM CHLORIDE 0.9 % IV SOLN: Freq: Once | INTRAVENOUS | Status: AC

## 2023-01-06 MED ORDER — MAGNESIUM SULFATE 2 GM/50ML IV SOLN
2.0000 g | Freq: Once | INTRAVENOUS | Status: AC
  Administered 2023-01-06: 2 g via INTRAVENOUS
  Filled 2023-01-06: qty 50

## 2023-01-06 MED ORDER — SODIUM CHLORIDE 0.9% FLUSH
10.0000 mL | INTRAVENOUS | Status: DC | PRN
  Administered 2023-01-06: 10 mL

## 2023-01-06 MED ORDER — SODIUM CHLORIDE 0.9 % IV SOLN
150.0000 mg | Freq: Once | INTRAVENOUS | Status: AC
  Administered 2023-01-06: 150 mg via INTRAVENOUS
  Filled 2023-01-06: qty 150

## 2023-01-06 MED ORDER — SODIUM CHLORIDE 0.9 % IV SOLN
10.0000 mg | Freq: Once | INTRAVENOUS | Status: AC
  Administered 2023-01-06: 10 mg via INTRAVENOUS
  Filled 2023-01-06: qty 10

## 2023-01-06 MED ORDER — HEPARIN SOD (PORK) LOCK FLUSH 100 UNIT/ML IV SOLN
500.0000 [IU] | Freq: Once | INTRAVENOUS | Status: AC | PRN
  Administered 2023-01-06: 500 [IU]

## 2023-01-06 MED ORDER — SODIUM CHLORIDE 0.9 % IV SOLN
40.0000 mg/m2 | Freq: Once | INTRAVENOUS | Status: AC
  Administered 2023-01-06: 100 mg via INTRAVENOUS
  Filled 2023-01-06: qty 100

## 2023-01-06 MED ORDER — SODIUM CHLORIDE 0.9% FLUSH
10.0000 mL | Freq: Once | INTRAVENOUS | Status: AC
  Administered 2023-01-06: 10 mL

## 2023-01-06 MED ORDER — POTASSIUM CHLORIDE IN NACL 20-0.9 MEQ/L-% IV SOLN
Freq: Once | INTRAVENOUS | Status: AC
  Filled 2023-01-06: qty 1000

## 2023-01-06 MED ORDER — PALONOSETRON HCL INJECTION 0.25 MG/5ML
0.2500 mg | Freq: Once | INTRAVENOUS | Status: AC
  Administered 2023-01-06: 0.25 mg via INTRAVENOUS
  Filled 2023-01-06: qty 5

## 2023-01-06 MED ORDER — PROCHLORPERAZINE MALEATE 10 MG PO TABS: 10.0000 mg | ORAL_TABLET | Freq: Four times a day (QID) | ORAL | 1 refills | Status: DC | PRN

## 2023-01-06 NOTE — Progress Notes (Signed)
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 male with medical history significant for hypertension and hypercholesteremia presents to the diagnostic clinic for evaluation for left neck lymphadenopathy.  On review of the previous records, Jonathan Hudson presented to the urgent care on 09/04/2022 with a large painful left neck mass. It was initially felt to be infectious so treated with doxycycline with little improvement. He followed by with his PCP on 09/22/2022 who ordered a ultrasound on 10/01/2022 which revealed a hypoechoic solid mass in the left neck measuring 4.7 x 2.7 x 4.2 cm. CT neck followed on 10/06/2022 which showed bulky and necrotic adenopathy in the left level 2 neck.   On exam today, Jonathan Hudson reports that his energy levels are fairly stable. He is able to complete all his ADLs on his own. He is currently on FMLA while he undergoes workup. He reports having a good appetite and denies any weight loss. He denies nausea, vomiting or abdominal pain. His bowel habits are unchanged without any recurrent episodes of diarrhea or constipation. He denies easy bruising or signs of active bleeding. He reports having intermittent episodes of positional dizziness without any syncopal episodes.He denies fevers, chills, night sweats, shortness of breath, chest pain, cough, peripheral edema, neuropathy, pruritus or rash. He has no other complaints. Rest of the 10 point ROS is below.   INTERVAL HISTORY  Jonathan Hudson 57 y.o. male is connected via phone for continued evaluation and management of his newly  diagnosed metastatic head and neck squamous cell carcinoma. He is here to start cycle 5 of his treatment.   Patient was last seen by me on 12/30/2022 and he complained of throat pain when swallowing food, occasional chills, skin rash near his left eye, and one episode of diarrhea.   Patient reports he has been doing well overall without any new medical concerns since our last visit. He notes that his throat pain has significantly improved and can eat food without any problem. He also reports that his skin rash near his left eye has been slowly improving.   He reports that the magic mouth wash only helps his throat for 1-2 days. However, he notes that lidocaine has been helping his throat numbness and he takes that with water before he eats food. Patient reports that compazine helps him sleep better without any pain and needs it to be refilled.    Overall, patient has been doing better than last week. He denies fever, chills, night sweats, infection issues, abdominal pain, back pain, chest pain, or leg swelling.   He tolerated his cycle 4 of his treatment well without any new or severe toxicities.   He reports his last radiation treatment is scheduled on March 22.   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[place])     SURGICAL HISTORY: Past Surgical History:  Procedure Laterality Date   COLONOSCOPY N/A 01/29/2017   Procedure: COLONOSCOPY;  Surgeon: Rogene Houston, MD;  [facility]: [facility];  Service: Endoscopy;  Laterality: N/A;  730   COLONOSCOPY WITH PROPOFOL N/A  04/03/2022   Procedure: COLONOSCOPY WITH PROPOFOL;  Surgeon: Rogene Houston, MD;  Location: [facility];  Service: Endoscopy;  Laterality: N/A;  730   cyst removed from left wrist      EYE SURGERY     grafts N/A 10  years  ago   grafts to gums   HEMOSTASIS CLIP PLACEMENT  04/03/2022   Procedure: HEMOSTASIS CLIP PLACEMENT;   Surgeon: Rogene Houston, MD;  [facility]: [facility];  Service: Endoscopy;;   IR GASTROSTOMY TUBE MOD SED  12/15/2022   IR IMAGING GUIDED PORT INSERTION  12/15/2022   IR NASO G TUBE PLC W/FL W/RAD  12/15/2022   OPEN REDUCTION INTERNAL FIXATION (ORIF) DISTAL RADIAL FRACTURE Right 12/28/2013   Procedure: OPEN REDUCTION INTERNAL FIXATION (ORIF) DISTAL RADIAL FRACTURE;  Surgeon: Carole Civil, MD;  Location: [facility];  Service: Orthopedics;  Laterality: Right;   ORIF ULNAR FRACTURE Right 12/28/2013   Procedure: OPEN REDUCTION INTERNAL FIXATION (ORIF) ULNAR FRACTURE;  Surgeon: Carole Civil, MD;  Location: [facility];  Service: Orthopedics;  Laterality: Right;   POLYPECTOMY  04/03/2022   Procedure: POLYPECTOMY;  Surgeon: Rogene Houston, MD;  [facility];  Service: Endoscopy;;   right knee Right teenager   right knee     SOCIAL HISTORY: Social History   Socioeconomic History   Marital status: Divorced    Spouse name: Not on file   Number of children: Not on file   Years of education: Not on file   Highest education level: Not on file  Occupational History   Not on file  Tobacco Use   Smoking status: Never   Smokeless tobacco: Never  Vaping Use   Vaping Use: Never used  Substance and Sexual Activity   Alcohol use: Yes    Comment: Occ  beer or wine    Drug use: No   Sexual activity: Not on file  Other Topics Concern   Not on file  Social History Narrative   Not on file   Social Determinants of Health   Financial Resource Strain: Low Risk  (12/03/2022)   Overall Financial Resource Strain (CARDIA)    Difficulty of Paying Living Expenses: Not hard at all  Food Insecurity: No Food Insecurity (12/03/2022)   Hunger Vital Sign    Worried About Running Out of Food in the Last Year: Never true    Ran Out of Food in the Last Year: Never true  Transportation Needs: No Transportation Needs (12/03/2022)   PRAPARE - Hydrologist (Medical): No     Lack of Transportation (Non-Medical): No  Physical Activity: Not on file  Stress: No Stress Concern Present (12/03/2022)   [place]    Feeling of Stress : Not at all  Social Connections: Not on file  Intimate Partner Violence: Not At Risk (12/03/2022)   Humiliation, Afraid, Rape, and Kick questionnaire    Fear of Current or Ex-Partner: No    Emotionally Abused: No    Physically Abused: No    Sexually Abused: No    FAMILY HISTORY: Family History  Problem Relation Age of Onset   Cancer Mother        small bowel spread to liver   Pancreatic cancer Father    Cancer Maternal Aunt    Brain cancer Maternal Uncle    Cancer Paternal Aunt    Breast cancer Cousin    Cancer Paternal Uncle  ALLERGIES:  has No Known Allergies.  MEDICATIONS:  Current Outpatient Medications  Medication Sig Dispense Refill   amLODipine (NORVASC) 10 MG tablet Take 10 mg by mouth daily.     atorvastatin (LIPITOR) 40 MG tablet Take 40 mg by mouth daily.     dexamethasone (DECADRON) 4 MG tablet Take 2 tablets daily x 3 days starting the day after cisplatin chemotherapy. Take with food. 30 tablet 1   lansoprazole (PREVACID) 15 MG capsule Take 15 mg by mouth every morning.     lidocaine-prilocaine (EMLA) cream Apply to affected area once 30 g 3   LORazepam (ATIVAN) 0.5 MG tablet Take 1 tablet (0.5 mg total) by mouth every 8 (eight) hours. 30 tablet 0   magic mouthwash (multi-ingredient) oral suspension Take 5 mls by mouth 4 times a day as needed for mouth pain 300 mL 0   magic mouthwash w/lidocaine SOLN Take 5 mLs by mouth 4 (four) times daily as needed for mouth pain. (Total volume dispensed - 341m 50 mL viscous lidocaine 2% 50 mL Maalox 50 mL diphenhydramine (Benadryl) at 12.5 mg per 5 ml elixir 50 mL nystatin (100,000U per 5 mL) suspension 50 mL prednisolone at '15mg'$  per 520msolution 50 mL distilled water 300 mL 1   Multiple Vitamin  (MULTIVITAMIN WITH MINERALS) TABS tablet Take 1 tablet by mouth daily.     neomycin-bacitracin-polymyxin [phone] OINT Apply 1 Application topically 3 (three) times daily. Around G tube opening 15 g 1   olmesartan (BENICAR) 40 MG tablet Take 40 mg by mouth daily.     ondansetron (ZOFRAN) 8 MG tablet Take 1 tablet (8 mg total) by mouth every 8 (eight) hours as needed for nausea or vomiting. Start on the third day after cisplatin. 30 tablet 1   oxyCODONE (ROXICODONE) 5 MG/5ML solution Place 5 mLs (5 mg total) into feeding tube every 6 (six) hours as needed for severe pain. 100 mL 0   prochlorperazine (COMPAZINE) 10 MG tablet Take 1 tablet (10 mg total) by mouth every 6 (six) hours as needed (Nausea or vomiting). 30 tablet 1   senna-docusate (SENNA S) 8.6-50 MG tablet Take 2 tablets by mouth at bedtime. 60 tablet 2   valACYclovir (VALTREX) 500 MG tablet 1000 mg po twice daily for 7 days then '500mg'$  po BID for shingles prophylaxis while on chemotherapy and 1 additional month. 60 tablet 1   No current facility-administered medications for this visit.   Facility-Administered Medications Ordered in Other Visits  Medication Dose Route Frequency Provider Last Rate Last Admin   sodium chloride flush (NS) 0.9 % injection 10 mL  10 mL Intracatheter Once KaBrunetta GeneraMD        REVIEW OF SYSTEMS:   10 Point review of Systems was done is negative except as noted above.  PHYSICAL EXAMINATION: VSS GENERAL:alert, in no acute distress and comfortable SKIN: no acute rashes, no significant lesions EYES: conjunctiva are pink and non-injected, sclera anicteric OROPHARYNX: MMM, no exudates, no oropharyngeal erythema or ulceration NECK: supple, no JVD LYMPH:  no palpable lymphadenopathy in the cervical, axillary or inguinal regions LUNGS: clear to auscultation b/l with normal respiratory effort HEART: regular rate & rhythm ABDOMEN:  normoactive bowel sounds , non tender, not distended. Extremity: no  pedal edema PSYCH: alert & oriented x 3 with fluent speech NEURO: no focal motor/sensory deficits   LABORATORY DATA:  I have reviewed the data as listed .    Latest Ref Rng & Units 01/06/2023    9:05  AM 12/30/2022    8:44 AM 12/24/2022    9:58 AM  CBC  WBC 4.0 - 10.5 K/uL 4.8  9.4  13.9   Hemoglobin 13.0 - 17.0 g/dL 12.4  13.6  14.0   Hematocrit 39.0 - 52.0 % 36.4  38.5  40.3   Platelets 150 - 400 K/uL 216  315  255    .    Latest Ref Rng & Units 01/06/2023    9:05 AM 12/30/2022    8:44 AM 12/24/2022    9:58 AM  CMP  Glucose 70 - 99 mg/dL 120  98  107   BUN 6 - 20 mg/dL '16  15  22   '$ Creatinine 0.61 - 1.24 mg/dL 1.04  1.01  1.00   Sodium 135 - 145 mmol/L 136  134  133   Potassium 3.5 - 5.1 mmol/L 4.3  4.4  4.7   Chloride 98 - 111 mmol/L 100  98  97   CO2 22 - 32 mmol/L '30  29  28   '$ Calcium 8.9 - 10.3 mg/dL 8.9  8.7  8.7      SURGICAL PATHOLOGY  CASE: [id number]  PATIENT: Jonathan Hudson  Surgical Pathology Report   Specimen Submitted:  A. Lymph node, left neck   Clinical History: Necrotic node CT, possible squamous cell.  No history  of CA   DIAGNOSIS:  A. LYMPH NODE, LEFT NECK; ULTRASOUND-GUIDED BIOPSY:  - METASTATIC SQUAMOUS CELL CARCINOMA, P16 POSITIVE.  - NECROTIC DEBRIS.   Comment:  The carcinoma is positive for p63 and p16. This pattern of staining  supports the above diagnosis.  There is insufficient material for ancillary molecular testing.   . Lab Results  Component Value Date   LDH 198 (H) 10/13/2022   Component     Latest Ref Rng 10/13/2022  HIV Screen 4th Generation wRfx     Non Reactive  Non Reactive   HCV Ab     NON REACTIVE  NON REACTIVE   Hepatitis B Surface Ag     NON REACTIVE  NON REACTIVE   Hep B S Ab     NON REACTIVE  NON REACTIVE   Hep B Core Total Ab     NON REACTIVE  NON REACTIVE   CRP     <1.0 mg/dL <0.5   Sed Rate     0 - 16 mm/hr 7   LDH     98 - 192 U/L 198 (H)     Legend: (H) High   RADIOGRAPHIC STUDIES: I  have personally reviewed the radiological images as listed and agreed with the findings in the report.  CT Angio Chest PE W and/or Wo Contrast  Result Date: 12/23/2022 CLINICAL DATA:  Pulmonary embolism suspected. High probability. Dry cough and fevers. Currently undergoing chemoradiation therapy for tonsillar cancer. Port placed 12/17/2022. PEG placed at that time. EXAM: CT ANGIOGRAPHY CHEST WITH CONTRAST TECHNIQUE: Multidetector CT imaging of the chest was performed using the standard protocol during bolus administration of intravenous contrast. Multiplanar CT image reconstructions and MIPs were obtained to evaluate the vascular anatomy. RADIATION DOSE REDUCTION: This exam was performed according to the departmental dose-optimization program which includes automated exposure control, adjustment of the mA and/or kV according to patient size and/or use of iterative reconstruction technique. CONTRAST:  35m OMNIPAQUE IOHEXOL 350 MG/ML SOLN COMPARISON:  PET-CT 11/13/2022, portable chest yesterday, PA and lateral 12/13/2016 FINDINGS: Cardiovascular: There is mild cardiomegaly with a left chamber predominance, septal and left ventricular wall and papillary  muscle hypertrophy. The pulmonary veins are decompressed. There are trace calcifications in the proximal LAD and proximal circumflex coronary arteries. The aorta and great vessels are normal caliber without evidence of significant plaques, aneurysm or dissection. Again noted is a right chest implanted port with IJ approach catheter terminating in the distal SVC. There is no subcutaneous abscess around the port. Pulmonary arteries are normal in caliber without appreciable thromboemboli. Mediastinum/Nodes: No enlarged mediastinal, hilar, or axillary lymph nodes. Thyroid gland, trachea, and esophagus demonstrate no significant findings. Lungs/Pleura: Lungs are clear. No pleural effusion or pneumothorax. Upper Abdomen: Again noted are multiple hepatic cysts and  additional too small to characterize scattered subcentimeter hypodensities, unchanged. No acute findings. Musculoskeletal: No chest wall abnormality. No acute or significant osseous findings. Review of the MIP images confirms the above findings. IMPRESSION: 1. No evidence of a pulmonary embolus. 2. Mild cardiomegaly with a left chamber predominance, septal and left ventricular wall and papillary muscle hypertrophy. 3. Trace coronary artery calcifications in the proximal LAD and proximal circumflex coronary arteries. No significant or appreciable aortic plaques. 4. No other significant findings.  Multiple hepatic cysts. Aortic Atherosclerosis (ICD10-I70.0). Electronically Signed   By: Telford Nab M.D.   On: 12/23/2022 02:33   DG Chest Portable 1 View  Result Date: 12/22/2022 CLINICAL DATA:  Shortness of breath, dry cough and fever. EXAM: PORTABLE CHEST 1 VIEW COMPARISON:  PA and lateral 12/13/16. FINDINGS: The heart size and mediastinal contours are within normal limits. Both lungs are clear of infiltrates. A calcified left mid perihilar granuloma is again noted. The visualized skeletal structures are unremarkable. Not seen previously, there is an implanted right chest port with the IJ approach catheter terminating in the distal SVC. IMPRESSION: No active disease. Right chest port in place. Old granulomatous disease. Electronically Signed   By: Telford Nab M.D.   On: 12/22/2022 23:27   IR [facility] [employer] [employer] W/FL W/Rad  Result Date: 12/15/2022 INDICATION: 57 year old male with history of head neck cancer presenting for Port-A-Cath and gastrostomy tube placements. EXAM: 1. IMPLANTED PORT A CATH PLACEMENT WITH ULTRASOUND AND FLUOROSCOPIC GUIDANCE 2. Fluoroscopic guided nasogastric tube placement 3. Fluoroscopic guided percutaneous gastrostomy tube placement COMPARISON:  None Available. MEDICATIONS: Ancef 2 gm IV; The antibiotic was administered within an appropriate time interval prior to skin puncture.  ANESTHESIA/SEDATION: Moderate (conscious) sedation was employed during this procedure. A total of Versed 4 mg and Fentanyl 200 mcg was administered intravenously. Moderate Sedation Time: 31 minutes. The patient's level of consciousness and vital signs were monitored continuously by radiology nursing throughout the procedure under my direct supervision. CONTRAST:  None FLUOROSCOPY TIME:  Eleven mGy COMPLICATIONS: None immediate. PROCEDURE: The procedure, risks, benefits, and alternatives were explained to the patient. Questions regarding the procedure were encouraged and answered. The patient understands and consents to the procedure. The right neck, chest, and upper abdomen were prepped with chlorhexidine in a sterile fashion, and a sterile drape was applied covering the operative field. Maximum barrier sterile technique with sterile gowns and gloves were used for the procedure. A timeout was performed prior to the initiation of the procedure. Ultrasound was used to examine the jugular vein which was compressible and free of internal echoes. A skin marker was used to demarcate the planned venotomy and port pocket incision sites. Local anesthesia was provided to these sites and the subcutaneous tunnel track with 1% lidocaine with 1:100,000 epinephrine. A small incision was created at the jugular access site and blunt dissection was performed of the  subcutaneous tissues. Under ultrasound guidance, the jugular vein was accessed with a 21 ga micropuncture needle and an 0.018" wire was inserted to the superior vena cava. Real-time ultrasound guidance was utilized for vascular access including the acquisition of a permanent ultrasound image documenting patency of the accessed vessel. A 5 Fr micopuncture set was then used, through which a 0.035" Rosen wire was passed under fluoroscopic guidance into the inferior vena cava. An 8 Fr dilator was then placed over the wire. A subcutaneous port pocket was then created along the  upper chest wall utilizing a combination of sharp and blunt dissection. The pocket was irrigated with sterile saline, packed with gauze, and observed for hemorrhage. A single lumen "ISP" sized power injectable port was chosen for placement. The 8 Fr catheter was tunneled from the port pocket site to the venotomy incision. The port was placed in the pocket. The external catheter was trimmed to appropriate length. The dilator was exchanged for an 8 Fr peel-away sheath under fluoroscopic guidance. The catheter was then placed through the sheath and the sheath was removed. Final catheter positioning was confirmed and documented with a fluoroscopic spot radiograph. The port was accessed with a Huber needle, aspirated, and flushed with heparinized saline. The deep dermal layer of the port pocket incision was closed with interrupted 3-0 Vicryl suture. Dermabond was then placed over the port pocket and neck incisions. Attention was then turned toward placement of the gastrostomy tube. Pre-procedure abdominal film confirmed visualization of the transverse colon. An angled 5-French catheter was passed through the nares into the stomach. The patient was prepped and draped in usual sterile fashion. The stomach was insufflated with air via the indwelling nasogastric tube. Under fluoroscopy, a puncture site was selected and local analgesia achieved with 1% lidocaine infiltrated subcutaneously. Under fluoroscopic guidance, a gastropexy needle was passed into the stomach and the T-bar suture was released. Entry into the stomach was confirmed with fluoroscopy, aspiration of air, and injection of contrast material. This was repeated with an additional gastropexy suture (for a total of 2 fasteners). At the center of these gastropexy sutures, a dermatotomy was performed. An 18 gauge needle was passed into the stomach at the site of this dermatotomy, and position within the gastric lumen again confirmed under fluoroscopy using  aspiration of air and contrast injection. An Amplatz guidewire was passed through this needle and intraluminal placement within the stomach was confirmed by fluoroscopy. The needle was removed. Over the guidewire, the percutaneous tract was dilated using a 10 mm non-compliant balloon. The balloon was deflated, then pushed into the gastric lumen followed in concert by the 20 Fr gastrostomy tube. The retention balloon of the percutaneous gastrostomy tube was inflated with 10 mL of sterile water. The tube was withdrawn until the retention balloon was at the edge of the gastric lumen. The external bumper was brought to the abdominal wall. Contrast was injected through the gastrostomy tube, confirming intraluminal positioning. The patient tolerated the procedures well without any immediate post-procedural complications. IMPRESSION: 1. Successful placement of a power injectable Port-A-Cath via the right internal jugular vein. The catheter is ready for immediate use. 2. Successful placement of 20 French balloon retention percutaneous gastrostomy tube. PLAN: The patient will be scheduled for 6 months routine gastrostomy tube exchange. Ruthann Cancer, MD [facility] Electronically Signed   By: Ruthann Cancer M.D.   On: 12/15/2022 11:52   IR IMAGING GUIDED PORT INSERTION  Result Date: 12/15/2022 INDICATION: 57 year old male with  history of head neck cancer presenting for Port-A-Cath and gastrostomy tube placements. EXAM: 1. IMPLANTED PORT A CATH PLACEMENT WITH ULTRASOUND AND FLUOROSCOPIC GUIDANCE 2. Fluoroscopic guided nasogastric tube placement 3. Fluoroscopic guided percutaneous gastrostomy tube placement COMPARISON:  None Available. MEDICATIONS: Ancef 2 gm IV; The antibiotic was administered within an appropriate time interval prior to skin puncture. ANESTHESIA/SEDATION: Moderate (conscious) sedation was employed during this procedure. A total of Versed 4 mg and  Fentanyl 200 mcg was administered intravenously. Moderate Sedation Time: 31 minutes. The patient's level of consciousness and vital signs were monitored continuously by radiology nursing throughout the procedure under my direct supervision. CONTRAST:  None FLUOROSCOPY TIME:  Eleven mGy COMPLICATIONS: None immediate. PROCEDURE: The procedure, risks, benefits, and alternatives were explained to the patient. Questions regarding the procedure were encouraged and answered. The patient understands and consents to the procedure. The right neck, chest, and upper abdomen were prepped with chlorhexidine in a sterile fashion, and a sterile drape was applied covering the operative field. Maximum barrier sterile technique with sterile gowns and gloves were used for the procedure. A timeout was performed prior to the initiation of the procedure. Ultrasound was used to examine the jugular vein which was compressible and free of internal echoes. A skin marker was used to demarcate the planned venotomy and port pocket incision sites. Local anesthesia was provided to these sites and the subcutaneous tunnel track with 1% lidocaine with 1:100,000 epinephrine. A small incision was created at the jugular access site and blunt dissection was performed of the subcutaneous tissues. Under ultrasound guidance, the jugular vein was accessed with a 21 ga micropuncture needle and an 0.018" wire was inserted to the superior vena cava. Real-time ultrasound guidance was utilized for vascular access including the acquisition of a permanent ultrasound image documenting patency of the accessed vessel. A 5 Fr micopuncture set was then used, through which a 0.035" Rosen wire was passed under fluoroscopic guidance into the inferior vena cava. An 8 Fr dilator was then placed over the wire. A subcutaneous port pocket was then created along the upper chest wall utilizing a combination of sharp and blunt dissection. The pocket was irrigated with sterile  saline, packed with gauze, and observed for hemorrhage. A single lumen "ISP" sized power injectable port was chosen for placement. The 8 Fr catheter was tunneled from the port pocket site to the venotomy incision. The port was placed in the pocket. The external catheter was trimmed to appropriate length. The dilator was exchanged for an 8 Fr peel-away sheath under fluoroscopic guidance. The catheter was then placed through the sheath and the sheath was removed. Final catheter positioning was confirmed and documented with a fluoroscopic spot radiograph. The port was accessed with a Huber needle, aspirated, and flushed with heparinized saline. The deep dermal layer of the port pocket incision was closed with interrupted 3-0 Vicryl suture. Dermabond was then placed over the port pocket and neck incisions. Attention was then turned toward placement of the gastrostomy tube. Pre-procedure abdominal film confirmed visualization of the transverse colon. An angled 5-French catheter was passed through the nares into the stomach. The patient was prepped and draped in usual sterile fashion. The stomach was insufflated with air via the indwelling nasogastric tube. Under fluoroscopy, a puncture site was selected and local analgesia achieved with 1% lidocaine infiltrated subcutaneously. Under fluoroscopic guidance, a gastropexy needle was passed into the stomach and the T-bar suture was released. Entry into the stomach was confirmed with fluoroscopy, aspiration of air, and injection  of contrast material. This was repeated with an additional gastropexy suture (for a total of 2 fasteners). At the center of these gastropexy sutures, a dermatotomy was performed. An 18 gauge needle was passed into the stomach at the site of this dermatotomy, and position within the gastric lumen again confirmed under fluoroscopy using aspiration of air and contrast injection. An Amplatz guidewire was passed through this needle and intraluminal  placement within the stomach was confirmed by fluoroscopy. The needle was removed. Over the guidewire, the percutaneous tract was dilated using a 10 mm non-compliant balloon. The balloon was deflated, then pushed into the gastric lumen followed in concert by the 20 Fr gastrostomy tube. The retention balloon of the percutaneous gastrostomy tube was inflated with 10 mL of sterile water. The tube was withdrawn until the retention balloon was at the edge of the gastric lumen. The external bumper was brought to the abdominal wall. Contrast was injected through the gastrostomy tube, confirming intraluminal positioning. The patient tolerated the procedures well without any immediate post-procedural complications. IMPRESSION: 1. Successful placement of a power injectable Port-A-Cath via the right internal jugular vein. The catheter is ready for immediate use. 2. Successful placement of 20 French balloon retention percutaneous gastrostomy tube. PLAN: The patient will be scheduled for 6 months routine gastrostomy tube exchange. Ruthann Cancer, MD [facility] Electronically Signed   By: Ruthann Cancer M.D.   On: 12/15/2022 11:01   IR Gastrostomy Tube  Result Date: 12/15/2022 INDICATION: 57 year old male with history of head neck cancer presenting for Port-A-Cath and gastrostomy tube placements. EXAM: 1. IMPLANTED PORT A CATH PLACEMENT WITH ULTRASOUND AND FLUOROSCOPIC GUIDANCE 2. Fluoroscopic guided nasogastric tube placement 3. Fluoroscopic guided percutaneous gastrostomy tube placement COMPARISON:  None Available. MEDICATIONS: Ancef 2 gm IV; The antibiotic was administered within an appropriate time interval prior to skin puncture. ANESTHESIA/SEDATION: Moderate (conscious) sedation was employed during this procedure. A total of Versed 4 mg and Fentanyl 200 mcg was administered intravenously. Moderate Sedation Time: 31 minutes. The patient's level of consciousness and  vital signs were monitored continuously by radiology nursing throughout the procedure under my direct supervision. CONTRAST:  None FLUOROSCOPY TIME:  Eleven mGy COMPLICATIONS: None immediate. PROCEDURE: The procedure, risks, benefits, and alternatives were explained to the patient. Questions regarding the procedure were encouraged and answered. The patient understands and consents to the procedure. The right neck, chest, and upper abdomen were prepped with chlorhexidine in a sterile fashion, and a sterile drape was applied covering the operative field. Maximum barrier sterile technique with sterile gowns and gloves were used for the procedure. A timeout was performed prior to the initiation of the procedure. Ultrasound was used to examine the jugular vein which was compressible and free of internal echoes. A skin marker was used to demarcate the planned venotomy and port pocket incision sites. Local anesthesia was provided to these sites and the subcutaneous tunnel track with 1% lidocaine with 1:100,000 epinephrine. A small incision was created at the jugular access site and blunt dissection was performed of the subcutaneous tissues. Under ultrasound guidance, the jugular vein was accessed with a 21 ga micropuncture needle and an 0.018" wire was inserted to the superior vena cava. Real-time ultrasound guidance was utilized for vascular access including the acquisition of a permanent ultrasound image documenting patency of the accessed vessel. A 5 Fr micopuncture set was then used, through which a 0.035" Rosen wire was passed under fluoroscopic guidance into the inferior vena cava. An 8 Fr  dilator was then placed over the wire. A subcutaneous port pocket was then created along the upper chest wall utilizing a combination of sharp and blunt dissection. The pocket was irrigated with sterile saline, packed with gauze, and observed for hemorrhage. A single lumen "ISP" sized power injectable port was chosen for placement.  The 8 Fr catheter was tunneled from the port pocket site to the venotomy incision. The port was placed in the pocket. The external catheter was trimmed to appropriate length. The dilator was exchanged for an 8 Fr peel-away sheath under fluoroscopic guidance. The catheter was then placed through the sheath and the sheath was removed. Final catheter positioning was confirmed and documented with a fluoroscopic spot radiograph. The port was accessed with a Huber needle, aspirated, and flushed with heparinized saline. The deep dermal layer of the port pocket incision was closed with interrupted 3-0 Vicryl suture. Dermabond was then placed over the port pocket and neck incisions. Attention was then turned toward placement of the gastrostomy tube. Pre-procedure abdominal film confirmed visualization of the transverse colon. An angled 5-French catheter was passed through the nares into the stomach. The patient was prepped and draped in usual sterile fashion. The stomach was insufflated with air via the indwelling nasogastric tube. Under fluoroscopy, a puncture site was selected and local analgesia achieved with 1% lidocaine infiltrated subcutaneously. Under fluoroscopic guidance, a gastropexy needle was passed into the stomach and the T-bar suture was released. Entry into the stomach was confirmed with fluoroscopy, aspiration of air, and injection of contrast material. This was repeated with an additional gastropexy suture (for a total of 2 fasteners). At the center of these gastropexy sutures, a dermatotomy was performed. An 18 gauge needle was passed into the stomach at the site of this dermatotomy, and position within the gastric lumen again confirmed under fluoroscopy using aspiration of air and contrast injection. An Amplatz guidewire was passed through this needle and intraluminal placement within the stomach was confirmed by fluoroscopy. The needle was removed. Over the guidewire, the percutaneous tract was dilated  using a 10 mm non-compliant balloon. The balloon was deflated, then pushed into the gastric lumen followed in concert by the 20 Fr gastrostomy tube. The retention balloon of the percutaneous gastrostomy tube was inflated with 10 mL of sterile water. The tube was withdrawn until the retention balloon was at the edge of the gastric lumen. The external bumper was brought to the abdominal wall. Contrast was injected through the gastrostomy tube, confirming intraluminal positioning. The patient tolerated the procedures well without any immediate post-procedural complications. IMPRESSION: 1. Successful placement of a power injectable Port-A-Cath via the right internal jugular vein. The catheter is ready for immediate use. 2. Successful placement of 20 French balloon retention percutaneous gastrostomy tube. PLAN: The patient will be scheduled for 6 months routine gastrostomy tube exchange. Ruthann Cancer, MD [facility] Electronically Signed   By: Ruthann Cancer M.D.   On: 12/15/2022 11:01    ASSESSMENT & PLAN  Jonathan Hudson is a 57 y.o. 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 Primary site unclear at this time but is likely oropharyngeal given HPV positivity. Undergoing IMRT and weekly Cisplatin #2  Radiation mucositis grade 2 #3 G-tube in place with mild serous/seropurulent discharge.  No induration or signs of infection. #4 mild protein calorie malnutrition -using tube feeding and hydration regularly at this time. #5 early herpes zoster along the cheek.  PLAN: -Discussed lab results from today, 01/06/2023, with the patient. CBC shows slightly decreased hemoglobin of 12.4 and hematocrit of 36.4. CMP is stable.  -Will refill Compazine.  -Patient has tolerated his treatment with cisplatin well without any toxicities.  -Patient can proceed with C5 Cisplatin treatment as scheduled without any  dose modification.   -Continue to apply anti-bacterial ointment around his feeding tube.  -Continue Valtrex for skin rash.  -Answered all of patient's questions.  -Recommended to continue to stay well-hydrated and eat well.  -Close follow-up with dietitian to optimize tube feeding.  FOLLOW-UP: -Please schedule next weekly cycle of cisplatin as per orders on 01/13/2023 with port flush labs and MD visit.  The total time spent in the appointment was 30 minutes* .  All of the patient's questions were answered with apparent satisfaction. The patient knows to call the clinic with any problems, questions or concerns.   Sullivan Lone MD MS AAHIVMS [facility] Hematology/Oncology [facility]ician [facility]  .*Total Encounter Time as defined by the Centers for Medicare and Medicaid Services includes, in addition to the face-to-face time of a patient visit (documented in the note above) non-face-to-face time: obtaining and reviewing outside history, ordering and reviewing medications, tests or procedures, care coordination (communications with other health care professionals or caregivers) and documentation in the medical record.   I, Cleda Mccreedy, am acting as a Education administrator for Sullivan Lone, MD.  .I have reviewed the above documentation for accuracy and completeness, and I agree with the above. Brunetta Genera MD

## 2023-01-06 NOTE — Progress Notes (Signed)
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 He is receiving concurrent chemoradiation with weekly cisplatin (final radiation planned 3/22). S/p PEG on 2/12    Met with patient during infusion. He is in good spirits and giving tube feeding during visit. Patient continues to have thick saliva. He is doing baking soda salt water rinses several times daily as well as sprite rinses. Patient has increased sore throat and discomfort when swallowing. Lidocaine rinse is working well for this. Patient reports he has a good appetite. He is tolerating soft moist foods orally. He recalls having a "big bowl" of oatmeal this morning. Patient is adding milk to this. Yesterday had couple spoonfuls of mac/cheese, pintos, potato soup and tuna. Patient is drinking ~100 ounces of water by mouth. He is currently giving one carton Osmolite 1.5 QID. Patient flushing tube with 90 ml water before and after. He has not been giving Prosource BID. Patient received formula/supplies from [facility]. Patient received incorrect syringes.    Medications: reviewed   Labs: reviewed   Anthropometrics: Wt 232.2 lb on 3/4 (aria) decreased from 235.8 lb on 2/26   -1.5% in 7 days - significant   Estimated Energy Needs   Kcals: 2725-3052 Protein: 141-163 Fluid: >2.7 L   NEW NUTRITION DIAGNOSIS: Inadequate oral intake continues - pt relying on tube    INTERVENTION:  Encouraged soft moist high calorie high protein foods orally as tolerated Continue one carton Osmolite 1.5 QID - pt will increase bolus feedings with decreased po/wt loss Pt agreeable to 45 ml Prosource TF BID - reviewed instructions for administering (pt has this at home as well) Provided [employer] syringes     MONITORING, EVALUATION, GOAL: weight trends, intake   NEXT VISIT: Tuesday March 12 during infusion

## 2023-01-06 NOTE — Patient Instructions (Signed)
Jonathan Hudson  Discharge Instructions: Thank you for choosing [facility] to provide your oncology and hematology care.   If you have a lab appointment with the [facility], please go directly to the [facility] and check in at the registration area.   Wear comfortable clothing and clothing appropriate for easy access to any Portacath or PICC line.   We strive to give you quality time with your provider. You may need to reschedule your appointment if you arrive late (15 or more minutes).  Arriving late affects you and other patients whose appointments are after yours.  Also, if you miss three or more appointments without notifying the office, you may be dismissed from the clinic at the provider's discretion.      For prescription refill requests, have your pharmacy contact our office and allow 72 hours for refills to be completed.    Today you received the following chemotherapy and/or immunotherapy agents: Cisplatin.      To help prevent nausea and vomiting after your treatment, we encourage you to take your nausea medication as directed.  BELOW ARE SYMPTOMS THAT SHOULD BE REPORTED IMMEDIATELY: *FEVER GREATER THAN 100.4 F (38 C) OR HIGHER *CHILLS OR SWEATING *NAUSEA AND VOMITING THAT IS NOT CONTROLLED WITH YOUR NAUSEA MEDICATION *UNUSUAL SHORTNESS OF BREATH *UNUSUAL BRUISING OR BLEEDING *URINARY PROBLEMS (pain or burning when urinating, or frequent urination) *BOWEL PROBLEMS (unusual diarrhea, constipation, pain near the anus) TENDERNESS IN MOUTH AND THROAT WITH OR WITHOUT PRESENCE OF ULCERS (sore throat, sores in mouth, or a toothache) UNUSUAL RASH, SWELLING OR PAIN  UNUSUAL VAGINAL DISCHARGE OR ITCHING   Items with * indicate a potential emergency and should be followed up as soon as possible or go to the Emergency Department if any problems should occur.  Please show the CHEMOTHERAPY ALERT CARD or IMMUNOTHERAPY ALERT CARD at  check-in to the Emergency Department and triage nurse.  Should you have questions after your visit or need to cancel or reschedule your appointment, please contact [facility]  Dept: [phone]  and follow the prompts.  Office hours are 8:00 a.m. to 4:30 p.m. Monday - Friday. Please note that voicemails left after 4:00 p.m. may not be returned until the following business day.  We are closed weekends and major holidays. You have access to a nurse at all times for urgent questions. Please call the main number to the clinic Dept: [phone] and follow the prompts.   For any non-urgent questions, you may also contact your provider using MyChart. We now offer e-Visits for anyone 19 and older to request care online for non-urgent symptoms. For details visit [email or web address].   Also download the MyChart app! Go to the app store, search "MyChart", open the app, select [facility], and log in with your MyChart username and password.

## 2023-01-07 ENCOUNTER — Ambulatory Visit
Admission: RE | Admit: 2023-01-07 | Discharge: 2023-01-07 | Disposition: A | Discharge status: Home / Self Care | Payer: 59 | Source: Ambulatory Visit | Attending: Radiation Oncology | Admitting: Radiation Oncology

## 2023-01-07 ENCOUNTER — Other Ambulatory Visit: Payer: Self-pay

## 2023-01-07 DIAGNOSIS — Z5111 Encounter for antineoplastic chemotherapy: Secondary | ICD-10-CM | POA: Diagnosis not present

## 2023-01-07 LAB — RAD ONC ARIA SESSION SUMMARY
Course Elapsed Days: 30
Plan Fractions Treated to Date: 23
Plan Prescribed Dose Per Fraction: 2 Gy
Plan Total Fractions Prescribed: 35
Plan Total Prescribed Dose: 70 Gy
Reference Point Dosage Given to Date: 46 Gy
Reference Point Session Dosage Given: 2 Gy
Session Number: 23

## 2023-01-08 ENCOUNTER — Other Ambulatory Visit: Payer: Self-pay

## 2023-01-08 ENCOUNTER — Ambulatory Visit
Admission: RE | Admit: 2023-01-08 | Discharge: 2023-01-08 | Disposition: A | Discharge status: Home / Self Care | Payer: 59 | Source: Ambulatory Visit | Attending: Radiation Oncology | Admitting: Radiation Oncology

## 2023-01-08 DIAGNOSIS — Z5111 Encounter for antineoplastic chemotherapy: Secondary | ICD-10-CM | POA: Diagnosis not present

## 2023-01-08 LAB — RAD ONC ARIA SESSION SUMMARY
Course Elapsed Days: 31
Plan Fractions Treated to Date: 24
Plan Prescribed Dose Per Fraction: 2 Gy
Plan Total Fractions Prescribed: 35
Plan Total Prescribed Dose: 70 Gy
Reference Point Dosage Given to Date: 48 Gy
Reference Point Session Dosage Given: 2 Gy
Session Number: 24

## 2023-01-09 ENCOUNTER — Other Ambulatory Visit: Payer: Self-pay

## 2023-01-09 ENCOUNTER — Ambulatory Visit
Admission: RE | Admit: 2023-01-09 | Discharge: 2023-01-09 | Disposition: A | Discharge status: Home / Self Care | Payer: 59 | Source: Ambulatory Visit | Attending: Radiation Oncology | Admitting: Radiation Oncology

## 2023-01-09 DIAGNOSIS — Z5111 Encounter for antineoplastic chemotherapy: Secondary | ICD-10-CM | POA: Diagnosis not present

## 2023-01-09 LAB — RAD ONC ARIA SESSION SUMMARY
Course Elapsed Days: 32
Plan Fractions Treated to Date: 25
Plan Prescribed Dose Per Fraction: 2 Gy
Plan Total Fractions Prescribed: 35
Plan Total Prescribed Dose: 70 Gy
Reference Point Dosage Given to Date: 50 Gy
Reference Point Session Dosage Given: 2 Gy
Session Number: 25

## 2023-01-12 ENCOUNTER — Other Ambulatory Visit: Payer: Self-pay

## 2023-01-12 ENCOUNTER — Encounter: Payer: Self-pay | Admitting: Hematology

## 2023-01-12 ENCOUNTER — Ambulatory Visit
Admission: RE | Admit: 2023-01-12 | Discharge: 2023-01-12 | Disposition: A | Discharge status: Home / Self Care | Payer: 59 | Source: Ambulatory Visit | Attending: Radiation Oncology | Admitting: Radiation Oncology

## 2023-01-12 DIAGNOSIS — Z5111 Encounter for antineoplastic chemotherapy: Secondary | ICD-10-CM | POA: Diagnosis not present

## 2023-01-12 LAB — RAD ONC ARIA SESSION SUMMARY
Course Elapsed Days: 35
Plan Fractions Treated to Date: 26
Plan Prescribed Dose Per Fraction: 2 Gy
Plan Total Fractions Prescribed: 35
Plan Total Prescribed Dose: 70 Gy
Reference Point Dosage Given to Date: 52 Gy
Reference Point Session Dosage Given: 2 Gy
Session Number: 26

## 2023-01-13 ENCOUNTER — Inpatient Hospital Stay: Payer: 59 | Admitting: Dietician

## 2023-01-13 ENCOUNTER — Other Ambulatory Visit: Payer: Self-pay

## 2023-01-13 ENCOUNTER — Ambulatory Visit
Admission: RE | Admit: 2023-01-13 | Discharge: 2023-01-13 | Disposition: A | Discharge status: Home / Self Care | Payer: 59 | Source: Ambulatory Visit | Attending: Radiation Oncology | Admitting: Radiation Oncology

## 2023-01-13 DIAGNOSIS — Z5111 Encounter for antineoplastic chemotherapy: Secondary | ICD-10-CM | POA: Diagnosis not present

## 2023-01-13 LAB — RAD ONC ARIA SESSION SUMMARY
Course Elapsed Days: 36
Plan Fractions Treated to Date: 27
Plan Prescribed Dose Per Fraction: 2 Gy
Plan Total Fractions Prescribed: 35
Plan Total Prescribed Dose: 70 Gy
Reference Point Dosage Given to Date: 54 Gy
Reference Point Session Dosage Given: 2 Gy
Session Number: 27

## 2023-01-13 MED FILL — Dexamethasone Sodium Phosphate Inj 100 MG/10ML: INTRAMUSCULAR | Qty: 1 | Status: AC

## 2023-01-13 MED FILL — Fosaprepitant Dimeglumine For IV Infusion 150 MG (Base Eq): INTRAVENOUS | Qty: 5 | Status: AC

## 2023-01-13 NOTE — Progress Notes (Signed)
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 He is receiving concurrent chemoradiation with weekly cisplatin (final radiation planned 3/22). S/p PEG on 2/12   Met with patient in office following radiation. He reports nausea with episodes of vomiting yesterday (3/11). His throat is "on fire." He took pain medication before leaving house today. This has helped to ease his pain some. Patient reports diarrhea over the weekend. He took one imodium. Patient continues to have thick saliva. He is using baking soda salt water rinses + ginger ale rinse with some relief. Patient drinking ~2 Ensure Complete. These have become more challenging to drink. He has been giving 4 cartons Osmolite 1.5 + 2 Prosource TF. He is agreeable to increasing tube feedings. Patient feels he would benefit from supportive IV fluids.    Medications: reviewed   Labs: reviewed   Anthropometrics: Weights trending down. Last aria wt 228.6 lb on 3/11 decreased 4 lbs in 7 days   3/4 - 232.2 lb 2/26 - 235.8 lb   Estimated Energy Needs   Kcals: 2725-3052 Protein: 141-163 Fluid: >2.7 L   NEW NUTRITION DIAGNOSIS: Inadequate oral intake continues - pt relying on tube   INTERVENTION:  Increase tube feedings. Give one and one half carton (360 ml) Osmolite 1.5 QID. Flush tube with 90 ml water before and after Continue 45 ml Prosource TF BID Provided samples of Banatrol TF for diarrhea. Instructed to give once daily via tube with 30 ml water before and after Take nausea medication as prescribed Continue baking soda salt water rinses several times daily Message sent to provider regarding supportive IVF    MONITORING, EVALUATION, GOAL: weight trends, intake, TF   NEXT VISIT: Friday March 22 after radiation

## 2023-01-14 ENCOUNTER — Inpatient Hospital Stay: Payer: 59

## 2023-01-14 ENCOUNTER — Ambulatory Visit
Admission: RE | Admit: 2023-01-14 | Discharge: 2023-01-14 | Disposition: A | Discharge status: Home / Self Care | Payer: 59 | Source: Ambulatory Visit | Attending: Radiation Oncology | Admitting: Radiation Oncology

## 2023-01-14 ENCOUNTER — Other Ambulatory Visit: Payer: Self-pay

## 2023-01-14 VITALS — BP 136/81 | HR 62 | Temp 98.0°F | Resp 18 | Wt 226.5 lb

## 2023-01-14 DIAGNOSIS — Z95828 Presence of other vascular implants and grafts: Secondary | ICD-10-CM

## 2023-01-14 DIAGNOSIS — C090 Malignant neoplasm of tonsillar fossa: Secondary | ICD-10-CM

## 2023-01-14 DIAGNOSIS — Z5111 Encounter for antineoplastic chemotherapy: Secondary | ICD-10-CM | POA: Diagnosis not present

## 2023-01-14 LAB — RAD ONC ARIA SESSION SUMMARY
Course Elapsed Days: 37
Plan Fractions Treated to Date: 28
Plan Prescribed Dose Per Fraction: 2 Gy
Plan Total Fractions Prescribed: 35
Plan Total Prescribed Dose: 70 Gy
Reference Point Dosage Given to Date: 56 Gy
Reference Point Session Dosage Given: 2 Gy
Session Number: 28

## 2023-01-14 LAB — CBC WITH DIFFERENTIAL (CANCER CENTER ONLY)
Abs Immature Granulocytes: 0.01 10*3/uL (ref 0.00–0.07)
Basophils Absolute: 0 10*3/uL (ref 0.0–0.1)
Basophils Relative: 0 %
Eosinophils Absolute: 0 10*3/uL (ref 0.0–0.5)
Eosinophils Relative: 1 %
HCT: 35.3 % — ABNORMAL LOW (ref 39.0–52.0)
Hemoglobin: 12.3 g/dL — ABNORMAL LOW (ref 13.0–17.0)
Immature Granulocytes: 0 %
Lymphocytes Relative: 16 %
Lymphs Abs: 0.7 10*3/uL (ref 0.7–4.0)
MCH: 31.3 pg (ref 26.0–34.0)
MCHC: 34.8 g/dL (ref 30.0–36.0)
MCV: 89.8 fL (ref 80.0–100.0)
Monocytes Absolute: 0.5 10*3/uL (ref 0.1–1.0)
Monocytes Relative: 12 %
Neutro Abs: 2.9 10*3/uL (ref 1.7–7.7)
Neutrophils Relative %: 71 %
Platelet Count: 132 10*3/uL — ABNORMAL LOW (ref 150–400)
RBC: 3.93 MIL/uL — ABNORMAL LOW (ref 4.22–5.81)
RDW: 13.7 % (ref 11.5–15.5)
WBC Count: 4.1 10*3/uL (ref 4.0–10.5)
nRBC: 0 % (ref 0.0–0.2)

## 2023-01-14 LAB — BASIC METABOLIC PANEL - CANCER CENTER ONLY
Anion gap: 6 (ref 5–15)
BUN: 19 mg/dL (ref 6–20)
CO2: 30 mmol/L (ref 22–32)
Calcium: 9 mg/dL (ref 8.9–10.3)
Chloride: 99 mmol/L (ref 98–111)
Creatinine: 1.18 mg/dL (ref 0.61–1.24)
GFR, Estimated: >60 mL/min (ref >60)
Glucose, Bld: 105 mg/dL — ABNORMAL HIGH (ref 70–99)
Potassium: 3.8 mmol/L (ref 3.5–5.1)
Sodium: 135 mmol/L (ref 135–145)

## 2023-01-14 LAB — MAGNESIUM: Magnesium: 1.9 mg/dL (ref 1.7–2.4)

## 2023-01-14 MED ORDER — POTASSIUM CHLORIDE IN NACL 20-0.9 MEQ/L-% IV SOLN
Freq: Once | INTRAVENOUS | Status: AC
  Filled 2023-01-14: qty 1000

## 2023-01-14 MED ORDER — SODIUM CHLORIDE 0.9 % IV SOLN
10.0000 mg | Freq: Once | INTRAVENOUS | Status: AC
  Administered 2023-01-14: 10 mg via INTRAVENOUS
  Filled 2023-01-14: qty 10

## 2023-01-14 MED ORDER — SODIUM CHLORIDE 0.9% FLUSH
10.0000 mL | Freq: Once | INTRAVENOUS | Status: AC
  Administered 2023-01-14: 10 mL

## 2023-01-14 MED ORDER — HEPARIN SOD (PORK) LOCK FLUSH 100 UNIT/ML IV SOLN
500.0000 [IU] | Freq: Once | INTRAVENOUS | Status: AC | PRN
  Administered 2023-01-14: 500 [IU]

## 2023-01-14 MED ORDER — SODIUM CHLORIDE 0.9% FLUSH
10.0000 mL | INTRAVENOUS | Status: DC | PRN
  Administered 2023-01-14: 10 mL

## 2023-01-14 MED ORDER — PALONOSETRON HCL INJECTION 0.25 MG/5ML
0.2500 mg | Freq: Once | INTRAVENOUS | Status: AC
  Administered 2023-01-14: 0.25 mg via INTRAVENOUS
  Filled 2023-01-14: qty 5

## 2023-01-14 MED ORDER — MAGNESIUM SULFATE 2 GM/50ML IV SOLN
2.0000 g | Freq: Once | INTRAVENOUS | Status: AC
  Administered 2023-01-14: 2 g via INTRAVENOUS
  Filled 2023-01-14: qty 50

## 2023-01-14 MED ORDER — SODIUM CHLORIDE 0.9 % IV SOLN
40.0000 mg/m2 | Freq: Once | INTRAVENOUS | Status: AC
  Administered 2023-01-14: 100 mg via INTRAVENOUS
  Filled 2023-01-14: qty 100

## 2023-01-14 MED ORDER — SODIUM CHLORIDE 0.9 % IV SOLN: Freq: Once | INTRAVENOUS | Status: AC

## 2023-01-14 MED ORDER — SODIUM CHLORIDE 0.9 % IV SOLN
150.0000 mg | Freq: Once | INTRAVENOUS | Status: AC
  Administered 2023-01-14: 150 mg via INTRAVENOUS
  Filled 2023-01-14: qty 150

## 2023-01-14 NOTE — Progress Notes (Signed)
Per Dr. Irene Limbo, [place] to proceed with cisplatin with urine output of 185 mL prior to tx.

## 2023-01-15 ENCOUNTER — Other Ambulatory Visit: Payer: Self-pay

## 2023-01-15 ENCOUNTER — Ambulatory Visit
Admission: RE | Admit: 2023-01-15 | Discharge: 2023-01-15 | Disposition: A | Discharge status: Home / Self Care | Payer: 59 | Source: Ambulatory Visit | Attending: Radiation Oncology | Admitting: Radiation Oncology

## 2023-01-15 DIAGNOSIS — Z5111 Encounter for antineoplastic chemotherapy: Secondary | ICD-10-CM | POA: Diagnosis not present

## 2023-01-15 LAB — RAD ONC ARIA SESSION SUMMARY
Course Elapsed Days: 38
Plan Fractions Treated to Date: 29
Plan Prescribed Dose Per Fraction: 2 Gy
Plan Total Fractions Prescribed: 35
Plan Total Prescribed Dose: 70 Gy
Reference Point Dosage Given to Date: 58 Gy
Reference Point Session Dosage Given: 2 Gy
Session Number: 29

## 2023-01-16 ENCOUNTER — Other Ambulatory Visit: Payer: Self-pay

## 2023-01-16 ENCOUNTER — Ambulatory Visit
Admission: RE | Admit: 2023-01-16 | Discharge: 2023-01-16 | Disposition: A | Discharge status: Home / Self Care | Payer: 59 | Source: Ambulatory Visit | Attending: Radiation Oncology | Admitting: Radiation Oncology

## 2023-01-16 DIAGNOSIS — Z5111 Encounter for antineoplastic chemotherapy: Secondary | ICD-10-CM | POA: Diagnosis not present

## 2023-01-16 LAB — RAD ONC ARIA SESSION SUMMARY
Course Elapsed Days: 39
Plan Fractions Treated to Date: 30
Plan Prescribed Dose Per Fraction: 2 Gy
Plan Total Fractions Prescribed: 35
Plan Total Prescribed Dose: 70 Gy
Reference Point Dosage Given to Date: 60 Gy
Reference Point Session Dosage Given: 2 Gy
Session Number: 30

## 2023-01-19 ENCOUNTER — Ambulatory Visit
Admission: RE | Admit: 2023-01-19 | Discharge: 2023-01-19 | Disposition: A | Discharge status: Home / Self Care | Payer: 59 | Source: Ambulatory Visit | Attending: Radiation Oncology | Admitting: Radiation Oncology

## 2023-01-19 ENCOUNTER — Other Ambulatory Visit: Payer: Self-pay

## 2023-01-19 DIAGNOSIS — Z5111 Encounter for antineoplastic chemotherapy: Secondary | ICD-10-CM | POA: Diagnosis not present

## 2023-01-19 LAB — RAD ONC ARIA SESSION SUMMARY
Course Elapsed Days: 42
Plan Fractions Treated to Date: 31
Plan Prescribed Dose Per Fraction: 2 Gy
Plan Total Fractions Prescribed: 35
Plan Total Prescribed Dose: 70 Gy
Reference Point Dosage Given to Date: 62 Gy
Reference Point Session Dosage Given: 2 Gy
Session Number: 31

## 2023-01-20 ENCOUNTER — Other Ambulatory Visit: Payer: Self-pay

## 2023-01-20 ENCOUNTER — Encounter: Payer: 59 | Admitting: Dietician

## 2023-01-20 ENCOUNTER — Ambulatory Visit
Admission: RE | Admit: 2023-01-20 | Discharge: 2023-01-20 | Disposition: A | Discharge status: Home / Self Care | Payer: 59 | Source: Ambulatory Visit | Attending: Radiation Oncology | Admitting: Radiation Oncology

## 2023-01-20 DIAGNOSIS — Z5111 Encounter for antineoplastic chemotherapy: Secondary | ICD-10-CM | POA: Diagnosis not present

## 2023-01-20 LAB — RAD ONC ARIA SESSION SUMMARY
Course Elapsed Days: 43
Plan Fractions Treated to Date: 32
Plan Prescribed Dose Per Fraction: 2 Gy
Plan Total Fractions Prescribed: 35
Plan Total Prescribed Dose: 70 Gy
Reference Point Dosage Given to Date: 64 Gy
Reference Point Session Dosage Given: 2 Gy
Session Number: 32

## 2023-01-21 ENCOUNTER — Ambulatory Visit: Payer: 59

## 2023-01-21 ENCOUNTER — Other Ambulatory Visit: Payer: Self-pay

## 2023-01-21 ENCOUNTER — Other Ambulatory Visit: Payer: 59

## 2023-01-21 ENCOUNTER — Ambulatory Visit
Admission: RE | Admit: 2023-01-21 | Discharge: 2023-01-21 | Disposition: A | Discharge status: Home / Self Care | Payer: 59 | Source: Ambulatory Visit | Attending: Radiation Oncology | Admitting: Radiation Oncology

## 2023-01-21 DIAGNOSIS — C090 Malignant neoplasm of tonsillar fossa: Secondary | ICD-10-CM

## 2023-01-21 DIAGNOSIS — Z5111 Encounter for antineoplastic chemotherapy: Secondary | ICD-10-CM | POA: Diagnosis not present

## 2023-01-21 LAB — RAD ONC ARIA SESSION SUMMARY
Course Elapsed Days: 44
Plan Fractions Treated to Date: 33
Plan Prescribed Dose Per Fraction: 2 Gy
Plan Total Fractions Prescribed: 35
Plan Total Prescribed Dose: 70 Gy
Reference Point Dosage Given to Date: 66 Gy
Reference Point Session Dosage Given: 2 Gy
Session Number: 33

## 2023-01-21 MED ORDER — PROCHLORPERAZINE MALEATE 10 MG PO TABS: 10.0000 mg | ORAL_TABLET | Freq: Four times a day (QID) | ORAL | 1 refills | Status: DC | PRN

## 2023-01-21 MED ORDER — ONDANSETRON HCL 8 MG PO TABS: 8.0000 mg | ORAL_TABLET | Freq: Three times a day (TID) | ORAL | 1 refills | Status: DC | PRN

## 2023-01-22 ENCOUNTER — Other Ambulatory Visit: Payer: Self-pay

## 2023-01-22 ENCOUNTER — Ambulatory Visit
Admission: RE | Admit: 2023-01-22 | Discharge: 2023-01-22 | Disposition: A | Discharge status: Home / Self Care | Payer: 59 | Source: Ambulatory Visit | Attending: Radiation Oncology | Admitting: Radiation Oncology

## 2023-01-22 ENCOUNTER — Inpatient Hospital Stay: Payer: 59

## 2023-01-22 ENCOUNTER — Inpatient Hospital Stay (HOSPITAL_BASED_OUTPATIENT_CLINIC_OR_DEPARTMENT_OTHER): Payer: 59 | Admitting: Hematology

## 2023-01-22 ENCOUNTER — Ambulatory Visit: Payer: 59

## 2023-01-22 ENCOUNTER — Other Ambulatory Visit (HOSPITAL_COMMUNITY): Payer: Self-pay

## 2023-01-22 VITALS — BP 88/63 | HR 90 | Temp 97.5°F | Resp 20 | Wt 221.8 lb

## 2023-01-22 VITALS — BP 113/77 | HR 68 | Resp 18

## 2023-01-22 DIAGNOSIS — E860 Dehydration: Secondary | ICD-10-CM | POA: Diagnosis not present

## 2023-01-22 DIAGNOSIS — K1233 Oral mucositis (ulcerative) due to radiation: Secondary | ICD-10-CM

## 2023-01-22 DIAGNOSIS — C090 Malignant neoplasm of tonsillar fossa: Secondary | ICD-10-CM | POA: Diagnosis not present

## 2023-01-22 DIAGNOSIS — G893 Neoplasm related pain (acute) (chronic): Secondary | ICD-10-CM | POA: Diagnosis not present

## 2023-01-22 DIAGNOSIS — Z5111 Encounter for antineoplastic chemotherapy: Secondary | ICD-10-CM | POA: Diagnosis not present

## 2023-01-22 DIAGNOSIS — Z95828 Presence of other vascular implants and grafts: Secondary | ICD-10-CM

## 2023-01-22 LAB — CBC WITH DIFFERENTIAL (CANCER CENTER ONLY)
Abs Immature Granulocytes: 0 10*3/uL (ref 0.00–0.07)
Basophils Absolute: 0 10*3/uL (ref 0.0–0.1)
Basophils Relative: 0 %
Eosinophils Absolute: 0 10*3/uL (ref 0.0–0.5)
Eosinophils Relative: 0 %
HCT: 35.4 % — ABNORMAL LOW (ref 39.0–52.0)
Hemoglobin: 12.5 g/dL — ABNORMAL LOW (ref 13.0–17.0)
Immature Granulocytes: 0 %
Lymphocytes Relative: 27 %
Lymphs Abs: 0.7 10*3/uL (ref 0.7–4.0)
MCH: 31.9 pg (ref 26.0–34.0)
MCHC: 35.3 g/dL (ref 30.0–36.0)
MCV: 90.3 fL (ref 80.0–100.0)
Monocytes Absolute: 0.3 10*3/uL (ref 0.1–1.0)
Monocytes Relative: 13 %
Neutro Abs: 1.5 10*3/uL — ABNORMAL LOW (ref 1.7–7.7)
Neutrophils Relative %: 60 %
Platelet Count: 89 10*3/uL — ABNORMAL LOW (ref 150–400)
RBC: 3.92 MIL/uL — ABNORMAL LOW (ref 4.22–5.81)
RDW: 14.5 % (ref 11.5–15.5)
WBC Count: 2.5 10*3/uL — ABNORMAL LOW (ref 4.0–10.5)
nRBC: 0 % (ref 0.0–0.2)

## 2023-01-22 LAB — CMP (CANCER CENTER ONLY)
ALT: 54 U/L — ABNORMAL HIGH (ref 0–44)
AST: 33 U/L (ref 15–41)
Albumin: 3.7 g/dL (ref 3.5–5.0)
Alkaline Phosphatase: 53 U/L (ref 38–126)
Anion gap: 8 (ref 5–15)
BUN: 18 mg/dL (ref 6–20)
CO2: 30 mmol/L (ref 22–32)
Calcium: 9.1 mg/dL (ref 8.9–10.3)
Chloride: 99 mmol/L (ref 98–111)
Creatinine: 1.15 mg/dL (ref 0.61–1.24)
GFR, Estimated: >60 mL/min (ref >60)
Glucose, Bld: 98 mg/dL (ref 70–99)
Potassium: 4.4 mmol/L (ref 3.5–5.1)
Sodium: 137 mmol/L (ref 135–145)
Total Bilirubin: 0.3 mg/dL (ref 0.3–1.2)
Total Protein: 6.7 g/dL (ref 6.5–8.1)

## 2023-01-22 LAB — RAD ONC ARIA SESSION SUMMARY
Course Elapsed Days: 45
Plan Fractions Treated to Date: 34
Plan Prescribed Dose Per Fraction: 2 Gy
Plan Total Fractions Prescribed: 35
Plan Total Prescribed Dose: 70 Gy
Reference Point Dosage Given to Date: 68 Gy
Reference Point Session Dosage Given: 2 Gy
Session Number: 34

## 2023-01-22 LAB — MAGNESIUM: Magnesium: 1.8 mg/dL (ref 1.7–2.4)

## 2023-01-22 MED ORDER — NYSTATIN 100000 UNIT/ML MT SUSP
5.0000 mL | Freq: Four times a day (QID) | OROMUCOSAL | 1 refills | Status: DC | PRN
  Filled 2023-01-22: qty 300, 15d supply, fill #0

## 2023-01-22 MED ORDER — SODIUM CHLORIDE 0.9 % IV SOLN: Freq: Once | INTRAVENOUS | Status: AC

## 2023-01-22 MED ORDER — ONDANSETRON HCL 4 MG/2ML IJ SOLN
8.0000 mg | INTRAMUSCULAR | Status: AC
  Administered 2023-01-22: 8 mg via INTRAVENOUS

## 2023-01-22 MED ORDER — HEPARIN SOD (PORK) LOCK FLUSH 100 UNIT/ML IV SOLN
500.0000 [IU] | Freq: Once | INTRAVENOUS | Status: AC
  Administered 2023-01-22: 500 [IU]

## 2023-01-22 MED ORDER — SODIUM CHLORIDE 0.9% FLUSH
10.0000 mL | Freq: Once | INTRAVENOUS | Status: AC
  Administered 2023-01-22: 10 mL

## 2023-01-22 MED ORDER — STERILE WATER FOR INJECTION IJ SOLN
OROMUCOSAL | 1 refills | Status: DC
  Filled 2023-01-22: qty 300, 15d supply, fill #0

## 2023-01-22 MED ORDER — OXYCODONE HCL 5 MG PO TABS: 5.0000 mg | ORAL_TABLET | ORAL | 0 refills | Status: DC | PRN

## 2023-01-22 MED ORDER — MAGIC MOUTHWASH W/LIDOCAINE: 5.0000 mL | Freq: Four times a day (QID) | ORAL | 1 refills | Status: DC | PRN

## 2023-01-22 MED ORDER — SUCRALFATE 1 G PO TABS: 1.0000 g | ORAL_TABLET | Freq: Three times a day (TID) | ORAL | 1 refills | Status: DC

## 2023-01-22 MED ORDER — FENTANYL 12 MCG/HR TD PT72: 1.0000 | MEDICATED_PATCH | TRANSDERMAL | 0 refills | Status: DC

## 2023-01-22 NOTE — Progress Notes (Signed)
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 male with medical history significant for hypertension and hypercholesteremia presents to the diagnostic clinic for evaluation for left neck lymphadenopathy.  On review of the previous records, Jonathan Hudson presented to the urgent care on 09/04/2022 with a large painful left neck mass. It was initially felt to be infectious so treated with doxycycline with little improvement. He followed by with his PCP on 09/22/2022 who ordered a ultrasound on 10/01/2022 which revealed a hypoechoic solid mass in the left neck measuring 4.7 x 2.7 x 4.2 cm. CT neck followed on 10/06/2022 which showed bulky and necrotic adenopathy in the left level 2 neck.   On exam today, Jonathan Hudson reports that his energy levels are fairly stable. He is able to complete all his ADLs on his own. He is currently on FMLA while he undergoes workup. He reports having a good appetite and denies any weight loss. He denies nausea, vomiting or abdominal pain. His bowel habits are unchanged without any recurrent episodes of diarrhea or constipation. He denies easy bruising or signs of active bleeding. He reports having intermittent episodes of positional dizziness without any syncopal episodes.He denies fevers, chills, night sweats, shortness of breath, chest pain, cough, peripheral edema, neuropathy, pruritus or rash. He has no other complaints. Rest of the 10 point ROS is below.   INTERVAL HISTORY  Jonathan Hudson is a 57 y.o. male here for continued evaluation and management of his newly diagnosed metastatic  head and neck squamous cell carcinoma. Patient was last seen by me on 01/06/2023 and reported that his throat pain and skin rash were improving.  Today, he is accompanied by his wife. He reports that he feels 5/10 overall. He does report that his throat feels like it's "on fire", but denies any nausea at this time. Patient reports that he does not have Sucralfate and that his magic mouth wash has expired. He denies any issues with leakage around the tube. He regularly drinks 5-6 bottles of water daily.  Patient is compliant with his pain medication. He takes two Oxycodone tablets daily, which does improve his pain, but does not have the same effect as it did when initially prescribed.He reports that the combination of Oxycodone and Compazine does improve his pain.   Patient did experience nausea and vomiting for two days this week, but this has resolved since yesterday. He does have poor p.o. intake and has lost some weight recently. Patient is able to successfully swallow pills and denies any abdominal pain or leg swelling. Patient does report diarrhea for 2-3 days but this has been manageable.  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[place])     SURGICAL HISTORY: Past Surgical History:  Procedure Laterality Date   COLONOSCOPY N/A 01/29/2017   Procedure: COLONOSCOPY;  Surgeon: Rogene Houston, MD;  [facility];  Service: Endoscopy;  Laterality: N/A;  730   COLONOSCOPY WITH PROPOFOL N/A 04/03/2022   Procedure: COLONOSCOPY WITH PROPOFOL;  Surgeon: Rogene Houston, MD;  Location: [facility];  Service:  Endoscopy;  Laterality: N/A;  730   cyst removed from left wrist      EYE SURGERY     grafts N/A 10  years  ago   grafts to gums   HEMOSTASIS CLIP PLACEMENT  04/03/2022   Procedure: HEMOSTASIS CLIP PLACEMENT;  Surgeon: Rogene Houston, MD;  [facility]: [facility];  Service: Endoscopy;;    IR GASTROSTOMY TUBE MOD SED  12/15/2022   IR IMAGING GUIDED PORT INSERTION  12/15/2022   IR NASO G TUBE PLC W/FL W/RAD  12/15/2022   OPEN REDUCTION INTERNAL FIXATION (ORIF) DISTAL RADIAL FRACTURE Right 12/28/2013   Procedure: OPEN REDUCTION INTERNAL FIXATION (ORIF) DISTAL RADIAL FRACTURE;  Surgeon: Carole Civil, MD;  Location: [facility];  Service: Orthopedics;  Laterality: Right;   ORIF ULNAR FRACTURE Right 12/28/2013   Procedure: OPEN REDUCTION INTERNAL FIXATION (ORIF) ULNAR FRACTURE;  Surgeon: Carole Civil, MD;  Location: [facility];  Service: Orthopedics;  Laterality: Right;   POLYPECTOMY  04/03/2022   Procedure: POLYPECTOMY;  Surgeon: Rogene Houston, MD;  [facility];  Service: Endoscopy;;   right knee Right teenager   right knee     SOCIAL HISTORY: Social History   Socioeconomic History   Marital status: Divorced    Spouse name: Not on file   Number of children: Not on file   Years of education: Not on file   Highest education level: Not on file  Occupational History   Not on file  Tobacco Use   Smoking status: Never   Smokeless tobacco: Never  Vaping Use   Vaping Use: Never used  Substance and Sexual Activity   Alcohol use: Yes    Comment: Occ  beer or wine    Drug use: No   Sexual activity: Not on file  Other Topics Concern   Not on file  Social History Narrative   Not on file   Social Determinants of Health   Financial Resource Strain: Low Risk  (12/03/2022)   Overall Financial Resource Strain (CARDIA)    Difficulty of Paying Living Expenses: Not hard at all  Food Insecurity: No Food Insecurity (12/03/2022)   Hunger Vital Sign    Worried About Running Out of Food in the Last Year: Never true    Ran Out of Food in the Last Year: Never true  Transportation Needs: No Transportation Needs (12/03/2022)   PRAPARE - Hydrologist (Medical): No    Lack of Transportation (Non-Medical): No  Physical Activity: Not on file   Stress: No Stress Concern Present (12/03/2022)   [place]    Feeling of Stress : Not at all  Social Connections: Not on file  Intimate Partner Violence: Not At Risk (12/03/2022)   Humiliation, Afraid, Rape, and Kick questionnaire    Fear of Current or Ex-Partner: No    Emotionally Abused: No    Physically Abused: No    Sexually Abused: No    FAMILY HISTORY: Family History  Problem Relation Age of Onset   Cancer Mother        small bowel spread to liver   Pancreatic cancer Father    Cancer Maternal Aunt    Brain cancer Maternal Uncle    Cancer Paternal Aunt    Breast cancer Cousin    Cancer Paternal Uncle     ALLERGIES:  has No Known Allergies.  MEDICATIONS:  Current Outpatient Medications  Medication Sig Dispense Refill  amLODipine (NORVASC) 10 MG tablet Take 10 mg by mouth daily.     atorvastatin (LIPITOR) 40 MG tablet Take 40 mg by mouth daily.     dexamethasone (DECADRON) 4 MG tablet Take 2 tablets daily x 3 days starting the day after cisplatin chemotherapy. Take with food. 30 tablet 1   lansoprazole (PREVACID) 15 MG capsule Take 15 mg by mouth every morning.     lidocaine-prilocaine (EMLA) cream Apply to affected area once 30 g 3   LORazepam (ATIVAN) 0.5 MG tablet Take 1 tablet (0.5 mg total) by mouth every 8 (eight) hours. 30 tablet 0   magic mouthwash (multi-ingredient) oral suspension Take 5 mls by mouth 4 times a day as needed for mouth pain 300 mL 0   magic mouthwash w/lidocaine SOLN Take 5 mLs by mouth 4 (four) times daily as needed for mouth pain. (Total volume dispensed - 391ml 50 mL viscous lidocaine 2% 50 mL Maalox 50 mL diphenhydramine (Benadryl) at 12.5 mg per 5 ml elixir 50 mL nystatin (100,000U per 5 mL) suspension 50 mL prednisolone at 15mg  per 17ml solution 50 mL distilled water 300 mL 1   Multiple Vitamin (MULTIVITAMIN WITH MINERALS) TABS tablet Take 1 tablet by mouth daily.      neomycin-bacitracin-polymyxin [phone] OINT Apply 1 Application topically 3 (three) times daily. Around G tube opening 15 g 1   olmesartan (BENICAR) 40 MG tablet Take 40 mg by mouth daily.     ondansetron (ZOFRAN) 8 MG tablet Take 1 tablet (8 mg total) by mouth every 8 (eight) hours as needed for nausea or vomiting. Start on the third day after cisplatin. 30 tablet 1   oxyCODONE (ROXICODONE) 5 MG/5ML solution Place 5 mLs (5 mg total) into feeding tube every 6 (six) hours as needed for severe pain. 100 mL 0   prochlorperazine (COMPAZINE) 10 MG tablet Take 1 tablet (10 mg total) by mouth every 6 (six) hours as needed (Nausea or vomiting). 30 tablet 1   senna-docusate (SENNA S) 8.6-50 MG tablet Take 2 tablets by mouth at bedtime. 60 tablet 2   valACYclovir (VALTREX) 500 MG tablet 1000 mg po twice daily for 7 days then 500mg  po BID for shingles prophylaxis while on chemotherapy and 1 additional month. 60 tablet 1   No current facility-administered medications for this visit.    REVIEW OF SYSTEMS:    10 Point review of Systems was done is negative except as noted above.   PHYSICAL EXAMINATION:   GENERAL:alert, in no acute distress and comfortable SKIN: no acute rashes, no significant lesions EYES: conjunctiva are pink and non-injected, sclera anicteric OROPHARYNX: MMM, no exudates, no oropharyngeal erythema or ulceration NECK: supple, no JVD LYMPH:  no palpable lymphadenopathy in the cervical, axillary or inguinal regions LUNGS: clear to auscultation b/l with normal respiratory effort HEART: regular rate & rhythm ABDOMEN:  normoactive bowel sounds , non tender, not distended.G tube in situ Extremity: no pedal edema PSYCH: alert & oriented x 3 with fluent speech NEURO: no focal motor/sensory deficits    LABORATORY DATA:  I have reviewed the data as listed .    Latest Ref Rng & Units 01/22/2023    8:51 AM 01/14/2023    7:48 AM 01/06/2023    9:05 AM  CBC  WBC 4.0 - 10.5 K/uL 2.5  4.1   4.8   Hemoglobin 13.0 - 17.0 g/dL 12.5  12.3  12.4   Hematocrit 39.0 - 52.0 % 35.4  35.3  36.4   Platelets 150 -  400 K/uL 89  132  216    .    Latest Ref Rng & Units 01/22/2023    8:51 AM 01/14/2023    7:48 AM 01/06/2023    9:05 AM  CMP  Glucose 70 - 99 mg/dL 98  105  120   BUN 6 - 20 mg/dL 18  19  16    Creatinine 0.61 - 1.24 mg/dL 1.15  1.18  1.04   Sodium 135 - 145 mmol/L 137  135  136   Potassium 3.5 - 5.1 mmol/L 4.4  3.8  4.3   Chloride 98 - 111 mmol/L 99  99  100   CO2 22 - 32 mmol/L 30  30  30    Calcium 8.9 - 10.3 mg/dL 9.1  9.0  8.9   Total Protein 6.5 - 8.1 g/dL 6.7     Total Bilirubin 0.3 - 1.2 mg/dL 0.3     Alkaline Phos 38 - 126 U/L 53     AST 15 - 41 U/L 33     ALT 0 - 44 U/L 54        SURGICAL PATHOLOGY  CASE: [id number]  PATIENT: Jonathan Hudson  Surgical Pathology Report   Specimen Submitted:  A. Lymph node, left neck   Clinical History: Necrotic node CT, possible squamous cell.  No history  of CA   DIAGNOSIS:  A. LYMPH NODE, LEFT NECK; ULTRASOUND-GUIDED BIOPSY:  - METASTATIC SQUAMOUS CELL CARCINOMA, P16 POSITIVE.  - NECROTIC DEBRIS.   Comment:  The carcinoma is positive for p63 and p16. This pattern of staining  supports the above diagnosis.  There is insufficient material for ancillary molecular testing.   . Lab Results  Component Value Date   LDH 198 (H) 10/13/2022   Component     Latest Ref Rng 10/13/2022  HIV Screen 4th Generation wRfx     Non Reactive  Non Reactive   HCV Ab     NON REACTIVE  NON REACTIVE   Hepatitis B Surface Ag     NON REACTIVE  NON REACTIVE   Hep B S Ab     NON REACTIVE  NON REACTIVE   Hep B Core Total Ab     NON REACTIVE  NON REACTIVE   CRP     <1.0 mg/dL <0.5   Sed Rate     0 - 16 mm/hr 7   LDH     98 - 192 U/L 198 (H)     Legend: (H) High   RADIOGRAPHIC STUDIES: I have personally reviewed the radiological images as listed and agreed with the findings in the report.  No results  found.  ASSESSMENT & PLAN  Jonathan Hudson is a 57 y.o. 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 Primary site unclear at this time but is likely oropharyngeal given HPV positivity. Undergoing IMRT and weekly Cisplatin #2  Radiation mucositis grade 2 #3 G-tube in place with mild serous/seropurulent discharge.  No induration or signs of infection. #4 mild protein calorie malnutrition -using tube feeding and hydration regularly at this time. #5 early herpes zoster along the cheek.  PLAN:  -Discussed lab results on 01/22/2023 in detail with patient. CBC showed WBC of 2.5K, hemoglobin of 12.5, and platelets of 89K. -low blood counts not concerning at this time -1.8 magnesium level -Sodium and potassium normal -Kidney levels and electrolytes normal -Mild inflammation of liver not concerning at this time -recommend patient to continue to use  majic mouth wash to reduce risk of yeast infections -discussed option that if patient continues to feel pain despite short acting pain medications, then would recommend a Fentanyl patch every 3 days for the next month or two, which patient is agreeable with -start low-dose fentanyl patch -informed patient that risk of addiction with cancer treatment much lower than non-cancer pain -Recommend patient to use Magic mouth wash as needed and may be used several times day -informed patient of option to take Oxycodone up to 4 times a day, patient currently takes it twice daily -Discussed that adequate nutrition/hydration and supportive pain management are primary goals at this time -order Magic mouth wash -set up IV infusions once a week -order Sucralfate -patient shall continue to follow-up with his nutritional dietitian regularly to optimize tube feeding -Patient has tolerated his treatment with cisplatin well without any toxicities.  -Continue to apply anti-bacterial ointment around his feeding tube.   -Continue Valtrex for skin rash as needed  FOLLOW-UP: Weekyl IV NS 1L x 4 weeks RTC with Dr Irene Limbo with portflush and labs in 3 weeks  The total time spent in the appointment was 32 minutes* .  All of the patient's questions were answered with apparent satisfaction. The patient knows to call the clinic with any problems, questions or concerns.   Sullivan Lone MD MS AAHIVMS [facility] Hematology/Oncology Physician [facility]  .*Total Encounter Time as defined by the Centers for Medicare and Medicaid Services includes, in addition to the face-to-face time of a patient visit (documented in the note above) non-face-to-face time: obtaining and reviewing outside history, ordering and reviewing medications, tests or procedures, care coordination (communications with other health care professionals or caregivers) and documentation in the medical record.    I,Mitra Faeizi,acting as a Education administrator for Sullivan Lone, MD.,have documented all relevant documentation on the behalf of Sullivan Lone, MD,as directed by  Sullivan Lone, MD while in the presence of Sullivan Lone, MD.   ..I have reviewed the above documentation for accuracy and completeness, and I agree with the above. Brunetta Genera MD

## 2023-01-22 NOTE — Addendum Note (Signed)
Addended by: Jethro Bolus A on: 01/22/2023 01:28 PM   Modules accepted: Orders

## 2023-01-22 NOTE — Patient Instructions (Signed)

## 2023-01-23 ENCOUNTER — Ambulatory Visit
Admission: RE | Admit: 2023-01-23 | Discharge: 2023-01-23 | Disposition: A | Discharge status: Home / Self Care | Payer: 59 | Source: Ambulatory Visit | Attending: Radiation Oncology | Admitting: Radiation Oncology

## 2023-01-23 ENCOUNTER — Inpatient Hospital Stay: Payer: 59 | Admitting: Dietician

## 2023-01-23 ENCOUNTER — Other Ambulatory Visit: Payer: Self-pay

## 2023-01-23 DIAGNOSIS — Z5111 Encounter for antineoplastic chemotherapy: Secondary | ICD-10-CM | POA: Diagnosis not present

## 2023-01-23 LAB — RAD ONC ARIA SESSION SUMMARY
Course Elapsed Days: 46
Plan Fractions Treated to Date: 35
Plan Prescribed Dose Per Fraction: 2 Gy
Plan Total Fractions Prescribed: 35
Plan Total Prescribed Dose: 70 Gy
Reference Point Dosage Given to Date: 70 Gy
Reference Point Session Dosage Given: 2 Gy
Session Number: 35

## 2023-01-23 NOTE — Progress Notes (Signed)
Nutrition Follow-up:    Patient with SCC of head and neck metastatic to left cervical lymph nodes. He is receiving concurrent chemoradiation with weekly cisplatin. S/p PEG on 2/12. Final radiation 3/22.  3/21 - IVF  Met briefly with patient to congratulate him on completing treatment as he was leaving radiation clinic. Family is with patient today. He requested telephone visit later in the day pending RD availability. RD connected with patient via telephone. He was sleeping at time of call. Patient reports eating some applesauce and potatoes with gravy this afternoon. He is unable to taste, but knows the importance of eating to maintain swallowing function. Patient continues giving 5 cartons of Osmolite 1.5. He is drinking one Ensure orally. Patient denies nausea, vomiting, diarrhea, constipation.    Medications: fentanyl, magic mouthwash, oxycodone, carafate  Labs: 3/21 labs reviewed   Anthropometrics:   3/11 - 228.6 lb 3/4 - 232.2 lb 2/26 - 235.8 lb   Estimated Energy Needs   Kcals: 2725-3052 Protein: 141-163 Fluid: >2.7 L   NUTRITION DIAGNOSIS: Inadequate oral intake continues - pt relying on tube     INTERVENTION:  Continue 5 cartons Osmolite 1.5 with Prosource 45 ml BID via tube Continue drinking Ensure Complete 2x/day Encouraged oral intake as tolerated Continue baking soda salt water rinses several times daily    MONITORING, EVALUATION, GOAL: weight trends, intake, TF   NEXT VISIT: Tuesday April 9 after MD

## 2023-01-23 NOTE — Progress Notes (Signed)
Oncology Nurse Navigator Documentation   Met with Jonathan Hudson after final RT to offer support and to celebrate end of radiation treatment.   Explained my role as navigator will continue for several more months, encouraged him to call me with needs/concerns.    Harlow Asa RN, BSN, OCN Head & Neck Oncology Nurse [place] [facility] Phone # [phone]  Fax # [phone]

## 2023-01-23 NOTE — Radiation Completion Notes (Signed)
Patient Name: Jonathan Hudson, Jonathan Hudson MRN: [id number] Date of Birth: [birth year] Referring Physician: Sullivan Lone, M.D. Date of Service: 2023-01-23 Radiation Oncologist: Eppie Gibson, M.D. 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 Staging on 2022-11-18: 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[place]) T=cT1, N=cN1, M=cM0 Intent: Curative     ==========DELIVERED PLANS==========  First Treatment Date: 2022-12-08 - Last Treatment Date: 2023-01-23   Plan Name: HN_L_Tonsil Site: Tonsil, Left Technique: IMRT Mode: Photon Dose Per Fraction: 2 Gy Prescribed Dose (Delivered / Prescribed): 70 Gy / 70 Gy Prescribed Fxs (Delivered / Prescribed): 35 / 35     ==========ON TREATMENT VISIT DATES========== 2022-12-08, 2022-12-15, 2022-12-22, 2022-12-29, 2023-01-05, 2023-01-12, 2023-01-19     ==========UPCOMING VISITS==========       ==========APPENDIX - ON TREATMENT VISIT NOTES==========   See weekly On Treatment Notes is Epic for details.

## 2023-01-27 NOTE — Progress Notes (Signed)
Mr. Kurylo presents today for follow up for completion of radiation therapy. He completed treatment of tonsill cancer on 01-23-23.   Pain issues, if any: Denies--reports he hasn't needed any PRN pain medication in the past 3 weeks Using a feeding tube?: Reports he used exclusively for ~2 weeks after finishing treatment, but states he has not used this last week Weight changes, if any:  Wt Readings from Last 3 Encounters:  02/10/23 214 lb 4 oz (97.2 kg)  01/22/23 221 lb 12.8 oz (100.6 kg)  01/14/23 226 lb 8 oz (102.7 kg)   Swallowing issues, if any: Denies--reports he's eating and drinking a wide variety Smoking or chewing tobacco? None Using fluoride trays daily? N/A--denies any dental concerns or mouth sores Last ENT visit was on: Not since diagnosis Other notable issues, if any: Continues to deal with dry mouth and thick phlegm, but is managing with salt gargles. Denies lingering fatigue and reports he mowed his lawn yesterday. Experienced some peeling in treatment field, but skin today appears intact and well healed. Overall reports he feels good and is great spirits   Vitals:   02/10/23 1445 02/10/23 1447  BP: 92/78 (!) 82/67  Pulse: (!) 102 (!) 108  Resp: 18   Temp: (!) 96.1 F (35.6 C)   SpO2: 99%

## 2023-01-28 ENCOUNTER — Encounter: Payer: Self-pay | Admitting: Hematology

## 2023-01-29 ENCOUNTER — Telehealth: Payer: Self-pay | Admitting: Hematology

## 2023-01-29 NOTE — Telephone Encounter (Signed)
Per 3/27 IB reached out to patient to schedule.

## 2023-02-04 ENCOUNTER — Other Ambulatory Visit: Payer: Self-pay

## 2023-02-04 ENCOUNTER — Inpatient Hospital Stay: Payer: 59 | Attending: Physician Assistant

## 2023-02-04 VITALS — BP 102/77 | HR 77 | Temp 98.3°F

## 2023-02-04 DIAGNOSIS — I10 Essential (primary) hypertension: Secondary | ICD-10-CM | POA: Insufficient documentation

## 2023-02-04 DIAGNOSIS — C090 Malignant neoplasm of tonsillar fossa: Secondary | ICD-10-CM | POA: Insufficient documentation

## 2023-02-04 DIAGNOSIS — Z79899 Other long term (current) drug therapy: Secondary | ICD-10-CM | POA: Insufficient documentation

## 2023-02-04 DIAGNOSIS — Z931 Gastrostomy status: Secondary | ICD-10-CM | POA: Insufficient documentation

## 2023-02-04 DIAGNOSIS — C770 Secondary and unspecified malignant neoplasm of lymph nodes of head, face and neck: Secondary | ICD-10-CM | POA: Insufficient documentation

## 2023-02-04 DIAGNOSIS — Z803 Family history of malignant neoplasm of breast: Secondary | ICD-10-CM | POA: Insufficient documentation

## 2023-02-04 DIAGNOSIS — Z87891 Personal history of nicotine dependence: Secondary | ICD-10-CM | POA: Insufficient documentation

## 2023-02-04 DIAGNOSIS — R42 Dizziness and giddiness: Secondary | ICD-10-CM | POA: Insufficient documentation

## 2023-02-04 DIAGNOSIS — E7800 Pure hypercholesterolemia, unspecified: Secondary | ICD-10-CM | POA: Insufficient documentation

## 2023-02-04 DIAGNOSIS — Z95828 Presence of other vascular implants and grafts: Secondary | ICD-10-CM

## 2023-02-04 DIAGNOSIS — E441 Mild protein-calorie malnutrition: Secondary | ICD-10-CM | POA: Insufficient documentation

## 2023-02-04 MED ORDER — HEPARIN SOD (PORK) LOCK FLUSH 100 UNIT/ML IV SOLN: 500.0000 [IU] | Freq: Once | INTRAVENOUS | Status: DC

## 2023-02-04 MED ORDER — SODIUM CHLORIDE 0.9 % IV SOLN: INTRAVENOUS | Status: AC

## 2023-02-04 MED ORDER — SODIUM CHLORIDE 0.9% FLUSH: 10.0000 mL | Freq: Once | INTRAVENOUS | Status: DC

## 2023-02-04 NOTE — Progress Notes (Signed)
Patient declined IVF today.  Patient states he feels much better since finishing chemotherapy and radiation therapy.  He is taking fluids and oatmeal orally and supplemental fluids through his G-tube.  BP 102/77, pulse 77.  Per Dr. Lorenso Courier, [place] to discharge without receiving fluids.  Patient ambulatory to lobby.

## 2023-02-10 ENCOUNTER — Encounter: Payer: 59 | Admitting: Nutrition

## 2023-02-10 ENCOUNTER — Other Ambulatory Visit: Payer: Self-pay

## 2023-02-10 ENCOUNTER — Inpatient Hospital Stay: Payer: 59 | Admitting: Dietician

## 2023-02-10 ENCOUNTER — Ambulatory Visit
Admission: RE | Admit: 2023-02-10 | Discharge: 2023-02-10 | Disposition: A | Discharge status: Home / Self Care | Payer: 59 | Source: Ambulatory Visit | Attending: Radiation Oncology | Admitting: Radiation Oncology

## 2023-02-10 VITALS — BP 82/67 | HR 108 | Temp 96.1°F | Resp 18 | Ht 74.0 in | Wt 214.2 lb

## 2023-02-10 DIAGNOSIS — Z79899 Other long term (current) drug therapy: Secondary | ICD-10-CM | POA: Diagnosis not present

## 2023-02-10 DIAGNOSIS — I959 Hypotension, unspecified: Secondary | ICD-10-CM | POA: Diagnosis not present

## 2023-02-10 DIAGNOSIS — C090 Malignant neoplasm of tonsillar fossa: Secondary | ICD-10-CM

## 2023-02-10 DIAGNOSIS — Z923 Personal history of irradiation: Secondary | ICD-10-CM | POA: Insufficient documentation

## 2023-02-10 NOTE — Progress Notes (Signed)
Radiation Oncology         [phone] ________________________________  Name: Jonathan Hudson MRN: [id number]  Date: 02/10/2023  DOB: 1966-03-23  Follow-Up Visit Note  CC: Jonathan Stabile, MD  [name], MD  Diagnosis and Prior Radiotherapy:       ICD-10-CM   1. 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      Cancer Staging  Carcinoma of tonsillar fossa Staging form: Pharynx - HPV-Mediated Oropharynx, AJCC 8th Edition - Clinical stage from 11/18/2022: Stage I (cT1, cN1, cM0) - Unsigned Stage prefix: Initial diagnosis   First Treatment Date: 2022-12-08 - Last Treatment Date: 2023-01-23   Plan Name: HN_L_Tonsil Site: Tonsil, Left Technique: IMRT Mode: Photon Dose Per Fraction: 2 Gy Prescribed Dose (Delivered / Prescribed): 70 Gy / 70 Gy Prescribed Fxs (Delivered / Prescribed): 35 / 35     CHIEF COMPLAINT:  Here for follow-up and surveillance of tonsil cancer  Narrative:  The patient returns today for routine follow-up.  He completed treatment 2 weeks ago.   Pain issues, if any: Denies--reports he hasn't needed any PRN pain medication in the past 3 weeks Using a feeding tube?: Reports he used exclusively for ~2 weeks after finishing treatment, but states he has not used this last week. He is eating very well, he is going to [facility] after this appointment.   Weight changes, if any:     Wt Readings from Last 3 Encounters:  02/10/23 214 lb 4 oz (97.2 kg)  01/22/23 221 lb 12.8 oz (100.6 kg)  01/14/23 226 lb 8 oz (102.7 kg)    Swallowing issues, if any: Denies--reports he's eating and drinking a wide variety but may be getting less clear fluid after stopping PEG use  Smoking or chewing tobacco? None Using fluoride trays daily? N/A--denies any dental concerns or mouth sores Last ENT visit was on: Not since diagnosis Other notable issues, if any: Continues to deal with dry mouth and thick phlegm, but is managing with salt gargles. Denies lingering fatigue and reports he mowed  his lawn yesterday. Experienced some peeling in treatment field, but skin today appears intact and well healed. Overall reports he feels good and is great spirits                 ALLERGIES:  has No Known Allergies.  Meds: Current Outpatient Medications  Medication Sig Dispense Refill   amLODipine (NORVASC) 10 MG tablet Take 10 mg by mouth daily.     atorvastatin (LIPITOR) 40 MG tablet Take 40 mg by mouth daily.     dexamethasone (DECADRON) 4 MG tablet Take 2 tablets daily x 3 days starting the day after cisplatin chemotherapy. Take with food. 30 tablet 1   fentaNYL (DURAGESIC) 12 MCG/HR Place 1 patch onto the skin every 3 (three) days. 10 patch 0   lansoprazole (PREVACID) 15 MG capsule Take 15 mg by mouth every morning.     lidocaine-prilocaine (EMLA) cream Apply to affected area once 30 g 3   LORazepam (ATIVAN) 0.5 MG tablet Take 1 tablet (0.5 mg total) by mouth every 8 (eight) hours. 30 tablet 0   magic mouthwash (multi-ingredient) oral suspension Take 5 mls by mouth 4 times a day as needed for mouth pain 300 mL 1   Multiple Vitamin (MULTIVITAMIN WITH MINERALS) TABS tablet Take 1 tablet by mouth daily.     neomycin-bacitracin-polymyxin [phone] OINT Apply 1 Application topically 3 (three) times daily. Around G tube opening 15 g 1   olmesartan (BENICAR)  40 MG tablet Take 40 mg by mouth daily.     ondansetron (ZOFRAN) 8 MG tablet Take 1 tablet (8 mg total) by mouth every 8 (eight) hours as needed for nausea or vomiting. Start on the third day after cisplatin. 30 tablet 1   oxyCODONE (OXY IR/ROXICODONE) 5 MG immediate release tablet Take 1 tablet (5 mg total) by mouth every 4 (four) hours as needed for severe pain. 60 tablet 0   prochlorperazine (COMPAZINE) 10 MG tablet Take 1 tablet (10 mg total) by mouth every 6 (six) hours as needed (Nausea or vomiting). 30 tablet 1   senna-docusate (SENNA S) 8.6-50 MG tablet Take 2 tablets by mouth at bedtime. 60 tablet 2   sucralfate (CARAFATE) 1 g  tablet Take 1 tablet (1 g total) by mouth with breakfast, with lunch, and with evening meal. Dissolve tab in in 20ml of water and slowly swallow 90 tablet 1   valACYclovir (VALTREX) 500 MG tablet 1000 mg po twice daily for 7 days then 500mg  po BID for shingles prophylaxis while on chemotherapy and 1 additional month. 60 tablet 1   No current facility-administered medications for this encounter.    Physical Findings: The patient is in no acute distress. Patient is alert and oriented. Wt Readings from Last 3 Encounters:  02/10/23 214 lb 4 oz (97.2 kg)  01/22/23 221 lb 12.8 oz (100.6 kg)  01/14/23 226 lb 8 oz (102.7 kg)    height is 6\' 2"  (1.88 m) and weight is 214 lb 4 oz (97.2 kg). His temporal temperature is 96.1 F (35.6 C) (abnormal). His blood pressure is 82/67 (abnormal) and his pulse is 108 (abnormal). His respiration is 18 and oxygen saturation is 99%. .  General: Alert and oriented, in no acute distress HEENT: Head is normocephalic. Extraocular movements are intact. Oropharynx is notable for resolving mucositis in the left upper throat, but no thrush.  Neck: Neck is notable for a mobile mass approximately 4 cm in diameter in the left upper anterior neck.  Skin: Skin in treatment fields shows satisfactory healing over his neck. Patient still has some pigment changes, but it is healing well. Heart: Borderline tachycardia. Regular in rate and rhythm with no murmurs, rubs, or gallops. Chest: Clear to auscultation bilaterally, with no rhonchi, wheezes, or rales. Abdomen: Soft, nontender, nondistended, with no rigidity or guarding. PEG tube site is clean and intact.  Extremities: No cyanosis or edema. Lymphatics: see Neck Exam Psychiatric: Judgment and insight are intact. Affect is appropriate.   Lab Findings: Lab Results  Component Value Date   WBC 2.5 (L) 01/22/2023   HGB 12.5 (L) 01/22/2023   HCT 35.4 (L) 01/22/2023   MCV 90.3 01/22/2023   PLT 89 (L) 01/22/2023    Lab Results   Component Value Date   TSH 2.244 12/01/2022    Radiographic Findings: No results found.  Impression/Plan:    1) Head and Neck Cancer Status: patient is healing very well from radiation. I am very pleased with his progress given he is only 2 weeks out from radiation therapy.   2) Nutritional Status: Patient is eating well at this point. Patient wants to stop with IV fluids, we will get this changed today. Patient is hypotensive on exam today which may be due to weight loss but may also be due to suboptimal hydration. He states that he has not been drinking water as much as her should. He was educated on the importance of proper hydration. He expressed understanding and will  push this by PO and use PEG prn.  PEG tube: intact. Patient has not used his PEG tube in the last week. He is wanting to keep his PEG tube until his next PET scan.  Weight changes, if any:     Wt Readings from Last 3 Encounters:  02/10/23 214 lb 4 oz (97.2 kg)  01/22/23 221 lb 12.8 oz (100.6 kg)  01/14/23 226 lb 8 oz (102.7 kg)     3) Risk Factors: The patient has been educated about risk factors including alcohol and tobacco abuse; they understand that avoidance of alcohol and tobacco is important to prevent recurrences as well as other cancers  4) Swallowing: Patient is swallowing well - know to continue with SLP exercises  5) Dental: Encouraged to continue regular followup with dentistry, and dental hygiene including fluoride rinses.  6) Thyroid function: Checking annually.  Lab Results  Component Value Date   TSH 2.244 12/01/2022    7) Other: Patient would like to see Dr. Pollyann Kennedy prior to his PET scan given persistent neck mass. We will get him scheduled for follow up in 1 month. He knows that eventual biopsy or surgery is possible if mass doesn't fully regress - PET will be helpful to guide treatment plan.  8) Follow-up with me in 2 months. Patient will be scheduled for PET scan on week of June 14-17th. I  will plan to see patient 1-2 days after the scan to review the results with him. He was encouraged to call with any issues or questions before then.  On date of service, in total, I spent 20 minutes on this encounter. Patient was seen in person.;  note signed after date of encounter - minutes pertain to date of service only. _____________________________________   Joyice Faster, PA    Lonie Peak, MD

## 2023-02-10 NOTE — Progress Notes (Signed)
Nutrition Follow-up:  Patient has completed concurrent chemoradiation for SCC of tonsillar fossa. Final radiation under the care of Dr. Basilio Cairo on 3/22  Met with patient in office. He reports "turning a corner" last Thursday. His taste has improved. Patient no longer having thickened saliva. He has a good appetite and wants to eat. Over weekend recalls baked chicken, collards, rice, squash, cornbread. He is planning to stop for a chicken sandwich on the way home. Patient mowed his grass yesterday. This was exhausting and took a few breaks. Overall he feels well and is motivated to continue eating/drinking so feeding tube can be removed.    Medications: reviewed   Labs: no new labs  Anthropometrics: Wt 214 lb 4 oz today decreased 6% (14 lbs) in 3 weeks - severe  3/11 - 228.6 lb   NUTRITION DIAGNOSIS: Inadequate oral intake improving    INTERVENTION:  Reviewed importance of ongoing increased nutrition needs to support healing  Encouraged high calorie high protein foods  Encouraged drinking Ensure plus BID Pt has contact information     MONITORING, EVALUATION, GOAL: weight trends, intake   NEXT VISIT: Tuesday June 18 in office

## 2023-02-11 ENCOUNTER — Inpatient Hospital Stay: Payer: 59

## 2023-02-11 NOTE — Progress Notes (Signed)
Oncology Nurse Navigator Documentation   I met with Jonathan Hudson before and during his follow up with Dr. Basilio Cairo yesterday. He is recovering well from his recent chemo/radiation for head and neck cancer. He would like to cancel all his upcoming IVF appointments because he is able to hydrate orally or with his PEG and they have been cancelled. He will see Dr. Basilio Cairo on 6/18 to receive results of his post treatment PET scan which will be completed before that date. At Dr. Colletta Maryland request I have scheduled him to see Dr. Pollyann Kennedy on 5/24 and notified [name] of the appointment via VM. I provided my direct contact information to call if he had any questions or concerns.   Hedda Slade RN, BSN, OCN Head & Neck Oncology Nurse Navigator [facility] Phone # [phone]  Fax # [phone]

## 2023-02-16 ENCOUNTER — Ambulatory Visit: Payer: 59 | Attending: Radiation Oncology

## 2023-02-16 ENCOUNTER — Ambulatory Visit: Payer: 59 | Attending: Radiation Oncology | Admitting: Physical Therapy

## 2023-02-16 ENCOUNTER — Telehealth: Payer: Self-pay

## 2023-02-16 ENCOUNTER — Other Ambulatory Visit: Payer: Self-pay

## 2023-02-16 DIAGNOSIS — C090 Malignant neoplasm of tonsillar fossa: Secondary | ICD-10-CM | POA: Insufficient documentation

## 2023-02-16 DIAGNOSIS — R293 Abnormal posture: Secondary | ICD-10-CM | POA: Insufficient documentation

## 2023-02-16 NOTE — Telephone Encounter (Signed)
   Speech Therapy (ST) - No Show Pt no-showed his scheduled 1100 appointment with SLP. SLP called pt at number provided in [facility] informing pt of his no-show, provided clinic number and asked him to call clinic and reschedule today's visit with our front office staff, and provided a few times this week that he could reschedule to. Lastly, told pt that his referring MD wanted [place] to have follow up appointments after his completion of chemoRT.  Will await pt return call to reschedule ST.  Verdie Mosher, MS, SLP

## 2023-02-17 ENCOUNTER — Inpatient Hospital Stay: Payer: 59

## 2023-02-17 ENCOUNTER — Inpatient Hospital Stay: Payer: 59 | Admitting: Hematology

## 2023-02-19 ENCOUNTER — Other Ambulatory Visit: Payer: Self-pay

## 2023-02-19 ENCOUNTER — Inpatient Hospital Stay (HOSPITAL_BASED_OUTPATIENT_CLINIC_OR_DEPARTMENT_OTHER): Payer: 59 | Admitting: Hematology

## 2023-02-19 ENCOUNTER — Inpatient Hospital Stay: Payer: 59

## 2023-02-19 VITALS — BP 139/93 | HR 82 | Temp 97.7°F | Resp 16 | Wt 215.2 lb

## 2023-02-19 DIAGNOSIS — Z931 Gastrostomy status: Secondary | ICD-10-CM | POA: Diagnosis not present

## 2023-02-19 DIAGNOSIS — I10 Essential (primary) hypertension: Secondary | ICD-10-CM | POA: Diagnosis not present

## 2023-02-19 DIAGNOSIS — E7800 Pure hypercholesterolemia, unspecified: Secondary | ICD-10-CM | POA: Diagnosis not present

## 2023-02-19 DIAGNOSIS — K1233 Oral mucositis (ulcerative) due to radiation: Secondary | ICD-10-CM

## 2023-02-19 DIAGNOSIS — Z803 Family history of malignant neoplasm of breast: Secondary | ICD-10-CM | POA: Diagnosis not present

## 2023-02-19 DIAGNOSIS — E441 Mild protein-calorie malnutrition: Secondary | ICD-10-CM | POA: Diagnosis not present

## 2023-02-19 DIAGNOSIS — C770 Secondary and unspecified malignant neoplasm of lymph nodes of head, face and neck: Secondary | ICD-10-CM | POA: Diagnosis not present

## 2023-02-19 DIAGNOSIS — R42 Dizziness and giddiness: Secondary | ICD-10-CM | POA: Diagnosis not present

## 2023-02-19 DIAGNOSIS — C090 Malignant neoplasm of tonsillar fossa: Secondary | ICD-10-CM

## 2023-02-19 DIAGNOSIS — Z87891 Personal history of nicotine dependence: Secondary | ICD-10-CM | POA: Diagnosis not present

## 2023-02-19 DIAGNOSIS — Z79899 Other long term (current) drug therapy: Secondary | ICD-10-CM | POA: Diagnosis not present

## 2023-02-19 DIAGNOSIS — Z95828 Presence of other vascular implants and grafts: Secondary | ICD-10-CM

## 2023-02-19 LAB — MAGNESIUM: Magnesium: 2 mg/dL (ref 1.7–2.4)

## 2023-02-19 LAB — CMP (CANCER CENTER ONLY)
ALT: 21 U/L (ref 0–44)
AST: 25 U/L (ref 15–41)
Albumin: 3.8 g/dL (ref 3.5–5.0)
Alkaline Phosphatase: 48 U/L (ref 38–126)
Anion gap: 6 (ref 5–15)
BUN: 14 mg/dL (ref 6–20)
CO2: 29 mmol/L (ref 22–32)
Calcium: 9.7 mg/dL (ref 8.9–10.3)
Chloride: 104 mmol/L (ref 98–111)
Creatinine: 1.19 mg/dL (ref 0.61–1.24)
GFR, Estimated: >60 mL/min (ref >60)
Glucose, Bld: 86 mg/dL (ref 70–99)
Potassium: 4.3 mmol/L (ref 3.5–5.1)
Sodium: 139 mmol/L (ref 135–145)
Total Bilirubin: 0.4 mg/dL (ref 0.3–1.2)
Total Protein: 6.9 g/dL (ref 6.5–8.1)

## 2023-02-19 LAB — CBC WITH DIFFERENTIAL (CANCER CENTER ONLY)
Abs Immature Granulocytes: 0.01 10*3/uL (ref 0.00–0.07)
Basophils Absolute: 0 10*3/uL (ref 0.0–0.1)
Basophils Relative: 1 %
Eosinophils Absolute: 0.2 10*3/uL (ref 0.0–0.5)
Eosinophils Relative: 4 %
HCT: 35.3 % — ABNORMAL LOW (ref 39.0–52.0)
Hemoglobin: 12 g/dL — ABNORMAL LOW (ref 13.0–17.0)
Immature Granulocytes: 0 %
Lymphocytes Relative: 34 %
Lymphs Abs: 1.7 10*3/uL (ref 0.7–4.0)
MCH: 31.7 pg (ref 26.0–34.0)
MCHC: 34 g/dL (ref 30.0–36.0)
MCV: 93.4 fL (ref 80.0–100.0)
Monocytes Absolute: 0.6 10*3/uL (ref 0.1–1.0)
Monocytes Relative: 12 %
Neutro Abs: 2.5 10*3/uL (ref 1.7–7.7)
Neutrophils Relative %: 49 %
Platelet Count: 330 10*3/uL (ref 150–400)
RBC: 3.78 MIL/uL — ABNORMAL LOW (ref 4.22–5.81)
RDW: 17.8 % — ABNORMAL HIGH (ref 11.5–15.5)
WBC Count: 5 10*3/uL (ref 4.0–10.5)
nRBC: 0 % (ref 0.0–0.2)

## 2023-02-19 LAB — LACTATE DEHYDROGENASE: LDH: 196 U/L — ABNORMAL HIGH (ref 98–192)

## 2023-02-19 MED ORDER — SODIUM CHLORIDE 0.9% FLUSH
10.0000 mL | Freq: Once | INTRAVENOUS | Status: AC
  Administered 2023-02-19: 10 mL

## 2023-02-19 MED ORDER — HEPARIN SOD (PORK) LOCK FLUSH 100 UNIT/ML IV SOLN
500.0000 [IU] | Freq: Once | INTRAVENOUS | Status: AC
  Administered 2023-02-19: 500 [IU]

## 2023-02-19 NOTE — Progress Notes (Signed)
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 male with medical history significant for hypertension and hypercholesteremia presents to the diagnostic clinic for evaluation for left neck lymphadenopathy.  On review of the previous records, Jonathan Hudson presented to the urgent care on 09/04/2022 with a large painful left neck mass. It was initially felt to be infectious so treated with doxycycline with little improvement. He followed by with his PCP on 09/22/2022 who ordered a ultrasound on 10/01/2022 which revealed a hypoechoic solid mass in the left neck measuring 4.7 x 2.7 x 4.2 cm. CT neck followed on 10/06/2022 which showed bulky and necrotic adenopathy in the left level 2 neck.   On exam today, Jonathan Hudson reports that his energy levels are fairly stable. He is able to complete all his ADLs on his own. He is currently on FMLA while he undergoes workup. He reports having a good appetite and denies any weight loss. He denies nausea, vomiting or abdominal pain. His bowel habits are unchanged without any recurrent episodes of diarrhea or constipation. He denies easy bruising or signs of active bleeding. He reports having intermittent episodes of positional dizziness without any syncopal episodes.He denies fevers, chills, night sweats, shortness of breath, chest pain, cough, peripheral edema, neuropathy, pruritus or rash. He has no other complaints. Rest of the 10 point ROS is below.   INTERVAL HISTORY  Jonathan Hudson is a 57 y.o. male here for continued evaluation and management of his metastatic head  and neck squamous cell carcinoma. Patient was last seen by me on 01/22/2023 and reported a burning sensation in his throat, poor p.o intake, weight loss, mild nausea/vomiting, and mild diarrhea.  Today, he denies any throat pain and has improved p.o intake. He reports that he is able to smell food, but his taste has not yet returned to baseline. Patient's weight has been stable. He does walk 0.5 miles daily He has not needed to use any pain or numbing medication.  Patient reports that he has had his first regular bowel movement yesterday with solid stools. A few weeks ago, he would only have a BM every 3-4 days and needed to use laxatives.  Patient does reports that he does need to drink liquids with food due to dry mouth. Patient denies any new lumps/bumps or back pain. Patient is planning to travel to [facility] for the next few days.  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HCC)     SURGICAL HISTORY: Past Surgical History:  Procedure Laterality Date   COLONOSCOPY N/A 01/29/2017   Procedure: COLONOSCOPY;  Surgeon: Malissa Hippo, MD;  [facility];  Service: Endoscopy;  Laterality: N/A;  730   COLONOSCOPY WITH PROPOFOL N/A 04/03/2022   Procedure: COLONOSCOPY WITH PROPOFOL;  Surgeon: Malissa Hippo, MD;  Location: [facility];  Service: Endoscopy;  Laterality: N/A;  730   cyst removed from left wrist      EYE SURGERY     grafts N/A 10  years  ago   grafts to gums  HEMOSTASIS CLIP PLACEMENT  04/03/2022   Procedure: HEMOSTASIS CLIP PLACEMENT;  Surgeon: Malissa Hippo, MD;  Location: [facility];  Service: Endoscopy;;   IR GASTROSTOMY TUBE MOD SED  12/15/2022   IR IMAGING GUIDED PORT INSERTION  12/15/2022   IR NASO G TUBE PLC W/FL W/RAD  12/15/2022   OPEN REDUCTION INTERNAL FIXATION (ORIF) DISTAL RADIAL FRACTURE Right 12/28/2013   Procedure: OPEN REDUCTION INTERNAL FIXATION  (ORIF) DISTAL RADIAL FRACTURE;  Surgeon: Vickki Hearing, MD;  Location: [facility];  Service: Orthopedics;  Laterality: Right;   ORIF ULNAR FRACTURE Right 12/28/2013   Procedure: OPEN REDUCTION INTERNAL FIXATION (ORIF) ULNAR FRACTURE;  Surgeon: Vickki Hearing, MD;  Location: [facility];  Service: Orthopedics;  Laterality: Right;   POLYPECTOMY  04/03/2022   Procedure: POLYPECTOMY;  Surgeon: Malissa Hippo, MD;  [facility];  Service: Endoscopy;;   right knee Right teenager   right knee     SOCIAL HISTORY: Social History   Socioeconomic History   Marital status: Divorced    Spouse name: Not on file   Number of children: Not on file   Years of education: Not on file   Highest education level: Not on file  Occupational History   Not on file  Tobacco Use   Smoking status: Never   Smokeless tobacco: Never  Vaping Use   Vaping Use: Never used  Substance and Sexual Activity   Alcohol use: Yes    Comment: Occ  beer or wine    Drug use: No   Sexual activity: Not on file  Other Topics Concern   Not on file  Social History Narrative   Not on file   Social Determinants of Health   Financial Resource Strain: Low Risk  (12/03/2022)   Overall Financial Resource Strain (CARDIA)    Difficulty of Paying Living Expenses: Not hard at all  Food Insecurity: No Food Insecurity (12/03/2022)   Hunger Vital Sign    Worried About Running Out of Food in the Last Year: Never true    Ran Out of Food in the Last Year: Never true  Transportation Needs: No Transportation Needs (12/03/2022)   PRAPARE - Administrator, Civil Service (Medical): No    Lack of Transportation (Non-Medical): No  Physical Activity: Not on file  Stress: No Stress Concern Present (12/03/2022)   [employer] of Occupational Health - Occupational Stress Questionnaire    Feeling of Stress : Not at all  Social Connections: Not on file  Intimate Partner Violence: Not At Risk (12/03/2022)   Humiliation,  Afraid, Rape, and Kick questionnaire    Fear of Current or Ex-Partner: No    Emotionally Abused: No    Physically Abused: No    Sexually Abused: No    FAMILY HISTORY: Family History  Problem Relation Age of Onset   Cancer Mother        small bowel spread to liver   Pancreatic cancer Father    Cancer Maternal Aunt    Brain cancer Maternal Uncle    Cancer Paternal Aunt    Breast cancer Cousin    Cancer Paternal Uncle     ALLERGIES:  has No Known Allergies.  MEDICATIONS:  Current Outpatient Medications  Medication Sig Dispense Refill   amLODipine (NORVASC) 10 MG tablet Take 10 mg by mouth daily.     atorvastatin (LIPITOR) 40 MG tablet Take 40 mg by mouth daily.     dexamethasone (DECADRON) 4 MG tablet Take 2  tablets daily x 3 days starting the day after cisplatin chemotherapy. Take with food. 30 tablet 1   fentaNYL (DURAGESIC) 12 MCG/HR Place 1 patch onto the skin every 3 (three) days. 10 patch 0   lansoprazole (PREVACID) 15 MG capsule Take 15 mg by mouth every morning.     lidocaine-prilocaine (EMLA) cream Apply to affected area once 30 g 3   LORazepam (ATIVAN) 0.5 MG tablet Take 1 tablet (0.5 mg total) by mouth every 8 (eight) hours. 30 tablet 0   magic mouthwash (multi-ingredient) oral suspension Take 5 mls by mouth 4 times a day as needed for mouth pain 300 mL 1   Multiple Vitamin (MULTIVITAMIN WITH MINERALS) TABS tablet Take 1 tablet by mouth daily.     neomycin-bacitracin-polymyxin [phone] OINT Apply 1 Application topically 3 (three) times daily. Around G tube opening 15 g 1   olmesartan (BENICAR) 40 MG tablet Take 40 mg by mouth daily.     ondansetron (ZOFRAN) 8 MG tablet Take 1 tablet (8 mg total) by mouth every 8 (eight) hours as needed for nausea or vomiting. Start on the third day after cisplatin. 30 tablet 1   oxyCODONE (OXY IR/ROXICODONE) 5 MG immediate release tablet Take 1 tablet (5 mg total) by mouth every 4 (four) hours as needed for severe pain. 60 tablet 0    prochlorperazine (COMPAZINE) 10 MG tablet Take 1 tablet (10 mg total) by mouth every 6 (six) hours as needed (Nausea or vomiting). 30 tablet 1   senna-docusate (SENNA S) 8.6-50 MG tablet Take 2 tablets by mouth at bedtime. 60 tablet 2   sucralfate (CARAFATE) 1 g tablet Take 1 tablet (1 g total) by mouth with breakfast, with lunch, and with evening meal. Dissolve tab in in 20ml of water and slowly swallow 90 tablet 1   valACYclovir (VALTREX) 500 MG tablet 1000 mg po twice daily for 7 days then  po BID for shingles prophylaxis while on chemotherapy and 1 additional month. 60 tablet 1   No current facility-administered medications for this visit.    REVIEW OF SYSTEMS:    10 Point review of Systems was done is negative except as noted above.   PHYSICAL EXAMINATION: .BP (!) 139/93 (BP Location: Left Arm, Patient Position: Sitting) Comment: Nurse was notify  Pulse 82   Temp 97.7 F (36.5 C) (Temporal)   Resp 16   Wt 215 lb 3.2 oz (97.6 kg)   SpO2 100%   BMI 27.63 kg/m   GENERAL:alert, in no acute distress and comfortable SKIN: no acute rashes, no significant lesions EYES: conjunctiva are pink and non-injected, sclera anicteric OROPHARYNX: MMM, no exudates, no oropharyngeal erythema or ulceration NECK: supple, no JVD LYMPH: left neck enlarged Lymph nodal mass--stable. LUNGS: clear to auscultation b/l with normal respiratory effort HEART: regular rate & rhythm ABDOMEN:  normoactive bowel sounds , non tender, not distended. Extremity: no pedal edema PSYCH: alert & oriented x 3 with fluent speech NEURO: no focal motor/sensory deficits   LABORATORY DATA:  I have reviewed the data as listed .    Latest Ref Rng & Units 02/19/2023   12:25 PM 01/22/2023    8:51 AM 01/14/2023    7:48 AM  CBC  WBC 4.0 - 10.5 K/uL 5.0  2.5  4.1   Hemoglobin 13.0 - 17.0 g/dL 16.1  09.6  04.5   Hematocrit 39.0 - 52.0 % 35.3  35.4  35.3   Platelets 150 - 400 K/uL 330  89  132    .  Latest Ref Rng &  Units 02/19/2023   12:25 PM 01/22/2023    8:51 AM 01/14/2023    7:48 AM  CMP  Glucose 70 - 99 mg/dL 86  98  161   BUN 6 - 20 mg/dL 14  18  19    Creatinine 0.61 - 1.24 mg/dL 0.96  0.45  4.09   Sodium 135 - 145 mmol/L 139  137  135   Potassium 3.5 - 5.1 mmol/L 4.3  4.4  3.8   Chloride 98 - 111 mmol/L 104  99  99   CO2 22 - 32 mmol/L 29  30  30    Calcium 8.9 - 10.3 mg/dL 9.7  9.1  9.0   Total Protein 6.5 - 8.1 g/dL 6.9  6.7    Total Bilirubin 0.3 - 1.2 mg/dL 0.4  0.3    Alkaline Phos 38 - 126 U/L 48  53    AST 15 - 41 U/L 25  33    ALT 0 - 44 U/L 21  54     Magnesium 2 . Lab Results  Component Value Date   LDH 196 (H) 02/19/2023     SURGICAL PATHOLOGY  CASE: [id number]  PATIENT: Cheree Ditto  Surgical Pathology Report   Specimen Submitted:  A. Lymph node, left neck   Clinical History: Necrotic node CT, possible squamous cell.  No history  of CA   DIAGNOSIS:  A. LYMPH NODE, LEFT NECK; ULTRASOUND-GUIDED BIOPSY:  - METASTATIC SQUAMOUS CELL CARCINOMA, P16 POSITIVE.  - NECROTIC DEBRIS.   Comment:  The carcinoma is positive for p63 and p16. This pattern of staining  supports the above diagnosis.  There is insufficient material for ancillary molecular testing.   . Lab Results  Component Value Date   LDH 198 (H) 10/13/2022   Component     Latest Ref Rng 10/13/2022  HIV Screen 4th Generation wRfx     Non Reactive  Non Reactive   HCV Ab     NON REACTIVE  NON REACTIVE   Hepatitis B Surface Ag     NON REACTIVE  NON REACTIVE   Hep B S Ab     NON REACTIVE  NON REACTIVE   Hep B Core Total Ab     NON REACTIVE  NON REACTIVE   CRP     <1.0 mg/dL <6.2   Sed Rate     0 - 16 mm/hr 7   LDH     98 - 192 U/L 198 (H)     Legend: (H) High   RADIOGRAPHIC STUDIES: I have personally reviewed the radiological images as listed and agreed with the findings in the report.  No results found.  ASSESSMENT & PLAN  Jonathan Hudson is a 57 y.o. male with  #1   Recently diagnosed HPV positive metastatic head and neck squamous cell carcinoma. Primary site unclear at this time but is likely oropharyngeal given HPV positivity. Underwent IMRT and weekly Cisplatin #2  Radiation mucositis grade 2 #3 G-tube in place with mild serous/seropurulent discharge.  No induration or signs of infection. #4 mild protein calorie malnutrition -using tube feeding and hydration regularly at this time. #5 early herpes zoster along the cheek.  PLAN:  -Patient's last chemotherapy treatment was 01/14/2023. Patient's last radiation treatment was 01/23/2023.  -Discussed lab results on 02/19/2023 with patient. CBC showed WBC of 5.0K, hemoglobin of 12.0, and platelets of 330K. -minimal anemia -Neutropenia resolved -cmp -- stable renal function and electrolytes. -no concern for dehydration at  this time - no other significant toxiity from chemo/radiation at this time. -Patient is scheduled for a repeat PET scan -continue magic mouth wash as needed -patient shall continue to follow-up with his nutritional dietitian regularly to optimize tube feeding  FOLLOW-UP: RTC with Dr Candise Che with port flush and labs in 2 months (after PET/CT) --on 6/18 if possible alongwith rad onc appointment.  The total time spent in the appointment was 20 minutes* .  All of the patient's questions were answered with apparent satisfaction. The patient knows to call the clinic with any problems, questions or concerns.   Wyvonnia Lora MD MS AAHIVMS [facility] Hematology/Oncology Physician [facility]  .*Total Encounter Time as defined by the Centers for Medicare and Medicaid Services includes, in addition to the face-to-face time of a patient visit (documented in the note above) non-face-to-face time: obtaining and reviewing outside history, ordering and reviewing medications, tests or procedures, care coordination (communications with other health care professionals or caregivers) and  documentation in the medical record.    I,Mitra Faeizi,acting as a Neurosurgeon for Wyvonnia Lora, MD.,have documented all relevant documentation on the behalf of Wyvonnia Lora, MD,as directed by  Wyvonnia Lora, MD while in the presence of Wyvonnia Lora, MD.  .I have reviewed the above documentation for accuracy and completeness, and I agree with the above. [name] MD

## 2023-02-20 ENCOUNTER — Telehealth: Payer: Self-pay | Admitting: Hematology

## 2023-02-25 ENCOUNTER — Inpatient Hospital Stay: Payer: 59

## 2023-02-25 ENCOUNTER — Encounter: Payer: Self-pay | Admitting: Hematology

## 2023-03-20 DIAGNOSIS — Z923 Personal history of irradiation: Secondary | ICD-10-CM | POA: Insufficient documentation

## 2023-03-20 DIAGNOSIS — C109 Malignant neoplasm of oropharynx, unspecified: Secondary | ICD-10-CM | POA: Insufficient documentation

## 2023-04-07 NOTE — Progress Notes (Addendum)
Mr. Doub presents today for follow up for completion of radiation therapy. He completed treatment of tonsill cancer on 01-23-23.   Pain issues, if any: no pain to report Using a feeding tube?: just flushing, eating well with no needs. Nutrition is recommending removal. Pt wants out as well.  Weight changes, if any:  Wt Readings from Last 3 Encounters:  04/21/23 214 lb 2 oz (97.1 kg)  04/21/23 214 lb 4.8 oz (97.2 kg)  02/19/23 215 lb 3.2 oz (97.6 kg)    Swallowing issues, if any: normal swallowing, sense of taste about 5/10, mouth remains dry Smoking or chewing tobacco? Never used Using fluoride toothpaste daily? None at this time, will follow up with dentist soon Last ENT visit was on: saw Dr. Pollyann Kennedy two weeks ago, no scope performed Other notable issues, if any: no concerns at this time. Doing well overall, fatigue level has been doing well. Pt bp is elevated, he reports he has not taken his bp meds lately and needs to start.   PET scan results:  04-16-23 IMPRESSION: Interval response to therapy as evidenced by decrease in size and hypermetabolism of left cervical lymph nodes. No residual tonsillar uptake.  Vitals:   04/21/23 1450  BP: (!) 164/84  Pulse: (!) 49  Resp: 18  Temp: (!) 96.9 F (36.1 C)  SpO2: 100%

## 2023-04-16 ENCOUNTER — Encounter
Admission: RE | Admit: 2023-04-16 | Discharge: 2023-04-16 | Disposition: A | Discharge status: Home / Self Care | Payer: 59 | Source: Ambulatory Visit | Attending: Radiation Oncology | Admitting: Radiation Oncology

## 2023-04-16 DIAGNOSIS — C090 Malignant neoplasm of tonsillar fossa: Secondary | ICD-10-CM | POA: Diagnosis present

## 2023-04-16 LAB — GLUCOSE, CAPILLARY: Glucose-Capillary: 91 mg/dL (ref 70–99)

## 2023-04-16 MED ORDER — FLUDEOXYGLUCOSE F - 18 (FDG) INJECTION
11.1000 | Freq: Once | INTRAVENOUS | Status: AC | PRN
  Administered 2023-04-16: 11.93 via INTRAVENOUS

## 2023-04-20 ENCOUNTER — Other Ambulatory Visit: Payer: Self-pay

## 2023-04-20 DIAGNOSIS — C090 Malignant neoplasm of tonsillar fossa: Secondary | ICD-10-CM

## 2023-04-21 ENCOUNTER — Inpatient Hospital Stay: Payer: 59 | Attending: Physician Assistant | Admitting: Dietician

## 2023-04-21 ENCOUNTER — Other Ambulatory Visit: Payer: Self-pay

## 2023-04-21 ENCOUNTER — Inpatient Hospital Stay: Payer: 59

## 2023-04-21 ENCOUNTER — Ambulatory Visit
Admission: RE | Admit: 2023-04-21 | Discharge: 2023-04-21 | Disposition: A | Discharge status: Home / Self Care | Payer: 59 | Source: Ambulatory Visit | Attending: Radiation Oncology | Admitting: Radiation Oncology

## 2023-04-21 ENCOUNTER — Encounter: Payer: Self-pay | Admitting: Radiation Oncology

## 2023-04-21 ENCOUNTER — Inpatient Hospital Stay (HOSPITAL_BASED_OUTPATIENT_CLINIC_OR_DEPARTMENT_OTHER): Payer: 59 | Admitting: Hematology

## 2023-04-21 VITALS — BP 159/88 | HR 59 | Temp 97.2°F | Resp 20 | Wt 214.3 lb

## 2023-04-21 VITALS — BP 164/84 | HR 49 | Temp 96.9°F | Resp 18 | Ht 74.0 in | Wt 214.1 lb

## 2023-04-21 DIAGNOSIS — K7689 Other specified diseases of liver: Secondary | ICD-10-CM | POA: Insufficient documentation

## 2023-04-21 DIAGNOSIS — Z79899 Other long term (current) drug therapy: Secondary | ICD-10-CM | POA: Insufficient documentation

## 2023-04-21 DIAGNOSIS — C090 Malignant neoplasm of tonsillar fossa: Secondary | ICD-10-CM

## 2023-04-21 DIAGNOSIS — Z7952 Long term (current) use of systemic steroids: Secondary | ICD-10-CM | POA: Insufficient documentation

## 2023-04-21 DIAGNOSIS — Z09 Encounter for follow-up examination after completed treatment for conditions other than malignant neoplasm: Secondary | ICD-10-CM | POA: Insufficient documentation

## 2023-04-21 DIAGNOSIS — Z923 Personal history of irradiation: Secondary | ICD-10-CM | POA: Insufficient documentation

## 2023-04-21 DIAGNOSIS — Z5111 Encounter for antineoplastic chemotherapy: Secondary | ICD-10-CM

## 2023-04-21 DIAGNOSIS — Z95828 Presence of other vascular implants and grafts: Secondary | ICD-10-CM

## 2023-04-21 LAB — CMP (CANCER CENTER ONLY)
ALT: 16 U/L (ref 0–44)
AST: 19 U/L (ref 15–41)
Albumin: 3.9 g/dL (ref 3.5–5.0)
Alkaline Phosphatase: 65 U/L (ref 38–126)
Anion gap: 4 — ABNORMAL LOW (ref 5–15)
BUN: 16 mg/dL (ref 6–20)
CO2: 28 mmol/L (ref 22–32)
Calcium: 9.2 mg/dL (ref 8.9–10.3)
Chloride: 107 mmol/L (ref 98–111)
Creatinine: 0.98 mg/dL (ref 0.61–1.24)
GFR, Estimated: >60 mL/min (ref >60)
Glucose, Bld: 102 mg/dL — ABNORMAL HIGH (ref 70–99)
Potassium: 4 mmol/L (ref 3.5–5.1)
Sodium: 139 mmol/L (ref 135–145)
Total Bilirubin: 0.5 mg/dL (ref 0.3–1.2)
Total Protein: 6.6 g/dL (ref 6.5–8.1)

## 2023-04-21 LAB — CBC WITH DIFFERENTIAL (CANCER CENTER ONLY)
Abs Immature Granulocytes: 0 10*3/uL (ref 0.00–0.07)
Basophils Absolute: 0 10*3/uL (ref 0.0–0.1)
Basophils Relative: 0 %
Eosinophils Absolute: 0.1 10*3/uL (ref 0.0–0.5)
Eosinophils Relative: 3 %
HCT: 38.3 % — ABNORMAL LOW (ref 39.0–52.0)
Hemoglobin: 13.1 g/dL (ref 13.0–17.0)
Immature Granulocytes: 0 %
Lymphocytes Relative: 36 %
Lymphs Abs: 1.7 10*3/uL (ref 0.7–4.0)
MCH: 32.5 pg (ref 26.0–34.0)
MCHC: 34.2 g/dL (ref 30.0–36.0)
MCV: 95 fL (ref 80.0–100.0)
Monocytes Absolute: 0.4 10*3/uL (ref 0.1–1.0)
Monocytes Relative: 8 %
Neutro Abs: 2.5 10*3/uL (ref 1.7–7.7)
Neutrophils Relative %: 53 %
Platelet Count: 213 10*3/uL (ref 150–400)
RBC: 4.03 MIL/uL — ABNORMAL LOW (ref 4.22–5.81)
RDW: 12.9 % (ref 11.5–15.5)
WBC Count: 4.7 10*3/uL (ref 4.0–10.5)
nRBC: 0 % (ref 0.0–0.2)

## 2023-04-21 LAB — LACTATE DEHYDROGENASE: LDH: 272 U/L — ABNORMAL HIGH (ref 98–192)

## 2023-04-21 LAB — MAGNESIUM: Magnesium: 1.9 mg/dL (ref 1.7–2.4)

## 2023-04-21 MED ORDER — HEPARIN SOD (PORK) LOCK FLUSH 100 UNIT/ML IV SOLN
500.0000 [IU] | Freq: Once | INTRAVENOUS | Status: AC
  Administered 2023-04-21: 500 [IU]

## 2023-04-21 MED ORDER — SODIUM CHLORIDE 0.9% FLUSH
10.0000 mL | Freq: Once | INTRAVENOUS | Status: AC
  Administered 2023-04-21: 10 mL

## 2023-04-21 NOTE — Progress Notes (Signed)
Nutrition Follow-up:  Patient completed concurrent chemoradiation for SCC of tonsillar fossa. Final radiation 01/22/22  Met with patient and sister in office. Patient reports he is doing well. He has a great appetite and tolerating a variety of textures without difficulty (egg mcmuffin, trout, baked chicken, deviled eggs, banana pudding, steak, cabbage, chicken wings).Patient states that cheese and potatoes will dry his mouth out. His taste is off with some foods. Patient continues daily water flushes. He is ready to have tube removed.   Medications: reviewed   Labs: reviewed   Anthropometrics: Wt 214 lb 2 oz today stable   4/9 - 214 lb 4 oz     NUTRITION DIAGNOSIS: Inadequate oral intake improved    INTERVENTION:  Encouraged high calorie high protein foods for weight maintenance  Recommend discontinuing PEG - pt to see Dr. Basilio Cairo today Patient has contact information, encouraged to contact with nutrition questions/concerns     MONITORING, EVALUATION, GOAL: weight trends, intake   NEXT VISIT: No follow-up scheduled

## 2023-04-21 NOTE — Progress Notes (Signed)
Radiation Oncology         [phone] ________________________________  Name: Jonathan Hudson MRN: [id number]  Date: 04/21/2023  DOB: Oct 30, 1966  Follow-Up Visit Note  CC: Benita Stabile, MD  [name], MD  Diagnosis and Prior Radiotherapy:       ICD-10-CM   1. 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 IR GASTROSTOMY TUBE REMOVAL    CT Soft Tissue Neck W Contrast      Cancer Staging  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HCC) Staging form: Pharynx - HPV-Mediated Oropharynx, AJCC 8th Edition - Clinical stage from 11/18/2022: Stage I (cT1, cN1, cM0) - Unsigned Stage prefix: Initial diagnosis   First Treatment Date: 2022-12-08 - Last Treatment Date: 2023-01-23   Plan Name: HN_L_Tonsil Site: Tonsil, Left Technique: IMRT Mode: Photon Dose Per Fraction: 2 Gy Prescribed Dose (Delivered / Prescribed): 70 Gy / 70 Gy Prescribed Fxs (Delivered / Prescribed): 35 / 35     CHIEF COMPLAINT:  Here for follow-up and surveillance of tonsil cancer  Narrative:  [name] presents today for follow up for completion of radiation therapy. He completed treatment of tonsill cancer on 01/23/23.            Pain issues, if any: no pain to report Using a feeding tube?: just flushing, eating well with no needs. Nutrition is recommending removal. Pt wants out as well.  Weight changes, if any:     Wt Readings from Last 3 Encounters:  04/21/23 214 lb 2 oz (97.1 kg)  04/21/23 214 lb 4.8 oz (97.2 kg)  02/19/23 215 lb 3.2 oz (97.6 kg)  Swallowing issues, if any: normal swallowing, sense of taste about 5/10, mouth remains dry Smoking or chewing tobacco? Never used Using fluoride toothpaste daily? None at this time, will follow up with dentist soon Last ENT visit was on: saw Dr. Pollyann Kennedy two weeks ago, no scope performed Other notable issues, if any: no concerns at this time. Doing well overall, fatigue level has been doing well. Pt bp is elevated, he reports he has not taken his bp meds lately and  needs to start.   ALLERGIES:  has No Known Allergies.  Meds: Current Outpatient Medications  Medication Sig Dispense Refill   amLODipine (NORVASC) 10 MG tablet Take 10 mg by mouth daily.     atorvastatin (LIPITOR) 40 MG tablet Take 40 mg by mouth daily.     dexamethasone (DECADRON) 4 MG tablet Take 2 tablets daily x 3 days starting the day after cisplatin chemotherapy. Take with food. 30 tablet 1   fentaNYL (DURAGESIC) 12 MCG/HR Place 1 patch onto the skin every 3 (three) days. 10 patch 0   lansoprazole (PREVACID) 15 MG capsule Take 15 mg by mouth every morning.     lidocaine-prilocaine (EMLA) cream Apply to affected area once 30 g 3   LORazepam (ATIVAN) 0.5 MG tablet Take 1 tablet (0.5 mg total) by mouth every 8 (eight) hours. 30 tablet 0   magic mouthwash (multi-ingredient) oral suspension Take 5 mls by mouth 4 times a day as needed for mouth pain 300 mL 1   Multiple Vitamin (MULTIVITAMIN WITH MINERALS) TABS tablet Take 1 tablet by mouth daily.     neomycin-bacitracin-polymyxin [id number] OINT Apply 1 Application topically 3 (three) times daily. Around G tube opening 15 g 1   olmesartan (BENICAR) 40 MG tablet Take 40 mg by mouth daily.     ondansetron (ZOFRAN) 8 MG tablet Take 1 tablet (8 mg total) by mouth every 8 (  eight) hours as needed for nausea or vomiting. Start on the third day after cisplatin. 30 tablet 1   oxyCODONE (OXY IR/ROXICODONE) 5 MG immediate release tablet Take 1 tablet (5 mg total) by mouth every 4 (four) hours as needed for severe pain. 60 tablet 0   prochlorperazine (COMPAZINE) 10 MG tablet Take 1 tablet (10 mg total) by mouth every 6 (six) hours as needed (Nausea or vomiting). 30 tablet 1   senna-docusate (SENNA S) 8.6-50 MG tablet Take 2 tablets by mouth at bedtime. 60 tablet 2   sucralfate (CARAFATE) 1 g tablet Take 1 tablet (1 g total) by mouth with breakfast, with lunch, and with evening meal. Dissolve tab in in 20ml of water and slowly swallow 90 tablet 1    valACYclovir (VALTREX) 500 MG tablet 1000 mg po twice daily for 7 days then 500mg  po BID for shingles prophylaxis while on chemotherapy and 1 additional month. 60 tablet 1   No current facility-administered medications for this encounter.    Physical Findings:  The patient is in no acute distress. Patient is alert and oriented. Wt Readings from Last 3 Encounters:  04/21/23 214 lb 2 oz (97.1 kg)  04/21/23 214 lb 4.8 oz (97.2 kg)  02/19/23 215 lb 3.2 oz (97.6 kg)    height is 6\' 2"  (1.88 m) and weight is 214 lb 2 oz (97.1 kg). His temporal temperature is 96.9 F (36.1 C) (abnormal). His blood pressure is 164/84 (abnormal) and his pulse is 49 (abnormal). His respiration is 18 and oxygen saturation is 100%. .  General: Alert and oriented, in no acute distress HEENT: Head is normocephalic. Extraocular movements are intact. Oropharynx is clear- no thrush.  Neck: Neck is notable for a mobile mass approximately 3 cm in diameter in the left upper anterior neck.  Skin: Skin in treatment fields shows satisfactory healing over his neck.   Abdomen: PEG tube+.  Extremities: No cyanosis or edema. Lymphatics: see Neck Exam Psychiatric: Judgment and insight are intact. Affect is appropriate.   Lab Findings: Lab Results  Component Value Date   WBC 4.7 04/21/2023   HGB 13.1 04/21/2023   HCT 38.3 (L) 04/21/2023   MCV 95.0 04/21/2023   PLT 213 04/21/2023    Lab Results  Component Value Date   TSH 2.244 12/01/2022    Radiographic Findings: NM PET Image Restag (PS) Skull Base To Thigh  Result Date: 04/20/2023 CLINICAL DATA:  Subsequent treatment strategy for tonsillar cancer. EXAM: NUCLEAR MEDICINE PET SKULL BASE TO THIGH TECHNIQUE: 11.9 mCi F-18 FDG was injected intravenously. Full-ring PET imaging was performed from the skull base to thigh after the radiotracer. CT data was obtained and used for attenuation correction and anatomic localization. Fasting blood glucose: 91 mg/dl COMPARISON:  CT  chest 12/23/2022 and PET 11/13/2022. FINDINGS: Mediastinal blood pool activity: SUV max 2.8 Liver activity: SUV max NA NECK: Necrotic left level II lymph node measures 3.2 x 3.2 cm (4/25), stable in size and without associated hypermetabolism. Residual mildly hypermetabolic left level II/III lymph nodes measure up to 11 mm in the level II station (4/20), SUV max 2.8. Incidental CT findings: None. CHEST: No abnormal hypermetabolism. Incidental CT findings: Right IJ Port-A-Cath terminates in the right atrium. Heart is enlarged. No pericardial or pleural effusion. ABDOMEN/PELVIS: No abnormal hypermetabolism. Incidental CT findings: Hepatic cysts. Minimally hypoattenuating lesion in the left hepatic lobe measures 2.6 cm, possibly a hemangioma. Liver, gallbladder, adrenal glands, kidneys, spleen, pancreas stomach and bowel are otherwise grossly unremarkable with the  exception of a percutaneous gastrostomy. SKELETON: No abnormal hypermetabolism. Incidental CT findings: None. IMPRESSION: Interval response to therapy as evidenced by decrease in size and hypermetabolism of left cervical lymph nodes. No residual tonsillar uptake. Electronically Signed   By: Leanna Battles M.D.   On: 04/20/2023 14:53    Impression/Plan:    1) Head and Neck Cancer Status: patient is healing very well from radiation. I continue to be very pleased with his progress. Most recent imaging showed a necrotic level II lymph node measuring 3.2 cm. This finding is not concerning for disease persistence as it is not hypermetabolic. ENT tumor board agrees. We reviewed these images with the patient today. We will continue to closely monitor.  2) Nutritional Status: Patient is eating well at this point.   PEG tube: intact. Patient has not been using his PEG tube. He is wanting to remove his PEG tube at this time. Order placed for removal today. Weight changes, if any:  maintaining weight Wt Readings from Last 3 Encounters:  04/21/23 214 lb 2 oz  (97.1 kg)  04/21/23 214 lb 4.8 oz (97.2 kg)  02/19/23 215 lb 3.2 oz (97.6 kg)    3) Risk Factors: The patient has been educated about risk factors including alcohol and tobacco abuse; they understand that avoidance of alcohol and tobacco is important to prevent recurrences as well as other cancers  4) Swallowing: Patient is swallowing well - know to continue with SLP exercises   5) Dental: Encouraged to continue regular followup with dentistry, flossing, and dental hygiene including fluoride rinses or fluoride toothpaste.   6) Thyroid function: Checking annually. Patient will see his PCP in a couple weeks. Recommend annual follow up, he states he will have his PCP monitor this annually to avoid multiple sticks.  Lab Results  Component Value Date   TSH 2.244 12/01/2022   7) Patient is scheduled to see Dr. Pollyann Kennedy in 2 months. Patient will be scheduled for CT of the neck in 6 months. I will see the patient back in a couple days after the scan to review the scan. He was encouraged to call with any issues or questions before then.    On date of service, in total, I spent 20 minutes on this encounter. Patient was seen in person.;  note signed after date of encounter - minutes pertain to date of service only. Note signed after encounter date; minutes pertain to date of service, only.  _____________________________________   Joyice Faster, PA    Lonie Peak, MD

## 2023-04-22 ENCOUNTER — Telehealth: Payer: Self-pay | Admitting: Hematology

## 2023-04-23 ENCOUNTER — Other Ambulatory Visit: Payer: Self-pay

## 2023-04-24 ENCOUNTER — Encounter: Payer: Self-pay | Admitting: Radiation Oncology

## 2023-04-24 ENCOUNTER — Other Ambulatory Visit: Payer: Self-pay

## 2023-04-28 ENCOUNTER — Encounter: Payer: Self-pay | Admitting: Hematology

## 2023-04-28 NOTE — Progress Notes (Signed)
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 male with medical history significant for hypertension and hypercholesteremia presents to the diagnostic clinic for evaluation for left neck lymphadenopathy.  On review of the previous records, Jonathan Hudson presented to the urgent care on 09/04/2022 with a large painful left neck mass. It was initially felt to be infectious so treated with doxycycline with little improvement. He followed by with his PCP on 09/22/2022 who ordered a ultrasound on 10/01/2022 which revealed a hypoechoic solid mass in the left neck measuring 4.7 x 2.7 x 4.2 cm. CT neck followed on 10/06/2022 which showed bulky and necrotic adenopathy in the left level 2 neck.   On exam today, Jonathan Hudson reports that his energy levels are fairly stable. He is able to complete all his ADLs on his own. He is currently on FMLA while he undergoes workup. He reports having a good appetite and denies any weight loss. He denies nausea, vomiting or abdominal pain. His bowel habits are unchanged without any recurrent episodes of diarrhea or constipation. He denies easy bruising or signs of active bleeding. He reports having intermittent episodes of positional dizziness without any syncopal episodes.He denies fevers, chills, night sweats, shortness of breath, chest pain, cough, peripheral edema, neuropathy, pruritus or  rash. He has no other complaints. Rest of the 10 point ROS is below.   INTERVAL HISTORY  Jonathan Hudson is he is here for follow-up with his wife for continued evaluation and management of his squamous cell head and neck cancer.  He is recovering well from his chemoradiation and has been eating well.  He recently had a PET CT scan that was reviewed in detail and shows no overt residual hypermetabolic lesions. Patient notes no acute focal symptoms.  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HCC)     SURGICAL HISTORY: Past Surgical History:  Procedure Laterality Date   COLONOSCOPY N/A 01/29/2017   Procedure: COLONOSCOPY;  Surgeon: Malissa Hippo, MD;  [facility]: [facility];  Service: Endoscopy;  Laterality: N/A;  730   COLONOSCOPY WITH PROPOFOL N/A 04/03/2022   Procedure: COLONOSCOPY WITH PROPOFOL;  Surgeon: Malissa Hippo, MD;  Location: [facility];  Service: Endoscopy;  Laterality: N/A;  730   cyst removed from left wrist      EYE SURGERY     grafts N/A 10  years  ago   grafts to gums   HEMOSTASIS CLIP PLACEMENT  04/03/2022   Procedure: HEMOSTASIS CLIP PLACEMENT;  Surgeon: Malissa Hippo, MD;  Location: [facility];  Service: Endoscopy;;   IR GASTROSTOMY TUBE MOD SED  12/15/2022   IR IMAGING GUIDED PORT INSERTION  12/15/2022   IR NASO G TUBE PLC W/FL W/RAD  12/15/2022   OPEN REDUCTION INTERNAL FIXATION (ORIF) DISTAL RADIAL FRACTURE Right 12/28/2013   Procedure: OPEN REDUCTION INTERNAL FIXATION (ORIF) DISTAL RADIAL FRACTURE;  Surgeon: Vickki Hearing, MD;  Location: [facility];  Service: Orthopedics;  Laterality: Right;   ORIF ULNAR FRACTURE Right 12/28/2013   Procedure: OPEN REDUCTION INTERNAL FIXATION (ORIF) ULNAR FRACTURE;  Surgeon: Vickki Hearing, MD;  Location: [facility];  Service: Orthopedics;  Laterality: Right;   POLYPECTOMY  04/03/2022   Procedure: POLYPECTOMY;   Surgeon: Malissa Hippo, MD;  Location: [facility];  Service: Endoscopy;;   right knee Right teenager   right knee     SOCIAL HISTORY: Social History   Socioeconomic History   Marital status: Divorced    Spouse name: Not on file   Number of children: Not on file   Years of education: Not on file   Highest education level: Not on file  Occupational History   Not on file  Tobacco Use   Smoking status: Never   Smokeless tobacco: Never  Vaping Use   Vaping Use: Never used  Substance and Sexual Activity   Alcohol use: Yes    Comment: Occ  beer or wine    Drug use: No   Sexual activity: Not on file  Other Topics Concern   Not on file  Social History Narrative   Not on file   Social Determinants of Health   Financial Resource Strain: Low Risk  (12/03/2022)   Overall Financial Resource Strain (CARDIA)    Difficulty of Paying Living Expenses: Not hard at all  Food Insecurity: No Food Insecurity (12/03/2022)   Hunger Vital Sign    Worried About Running Out of Food in the Last Year: Never true    Ran Out of Food in the Last Year: Never true  Transportation Needs: No Transportation Needs (12/03/2022)   PRAPARE - Administrator, Civil Service (Medical): No    Lack of Transportation (Non-Medical): No  Physical Activity: Not on file  Stress: No Stress Concern Present (12/03/2022)   [employer] of Occupational Health - Occupational Stress Questionnaire    Feeling of Stress : Not at all  Social Connections: Not on file  Intimate Partner Violence: Not At Risk (12/03/2022)   Humiliation, Afraid, Rape, and Kick questionnaire    Fear of Hudson or Ex-Partner: No    Emotionally Abused: No    Physically Abused: No    Sexually Abused: No    FAMILY HISTORY: Family History  Problem Relation Age of Onset   Cancer Mother        small bowel spread to liver   Pancreatic cancer Father    Cancer Maternal Aunt    Brain cancer Maternal Uncle    Cancer Paternal Aunt     Breast cancer Cousin    Cancer Paternal Uncle     ALLERGIES:  has No Known Allergies.  MEDICATIONS:  [facility] Outpatient Medications  Medication Sig Dispense Refill   amLODipine (NORVASC) 10 MG tablet Take 10 mg by mouth daily.     atorvastatin (LIPITOR) 40 MG tablet Take 40 mg by mouth daily.     dexamethasone (DECADRON) 4 MG tablet Take 2 tablets daily x 3 days starting the day after cisplatin chemotherapy. Take with food. 30 tablet 1   fentaNYL (DURAGESIC) 12 MCG/HR Place 1 patch onto the skin every 3 (three) days. 10 patch 0   lansoprazole (PREVACID) 15 MG capsule Take 15 mg by mouth every morning.     lidocaine-prilocaine (EMLA) cream Apply to affected area once 30 g 3   LORazepam (ATIVAN) 0.5 MG tablet Take 1 tablet (0.5 mg total) by mouth every 8 (eight) hours.  30 tablet 0   magic mouthwash (multi-ingredient) oral suspension Take 5 mls by mouth 4 times a day as needed for mouth pain 300 mL 1   Multiple Vitamin (MULTIVITAMIN WITH MINERALS) TABS tablet Take 1 tablet by mouth daily.     neomycin-bacitracin-polymyxin [phone] OINT Apply 1 Application topically 3 (three) times daily. Around G tube opening 15 g 1   olmesartan (BENICAR) 40 MG tablet Take 40 mg by mouth daily.     ondansetron (ZOFRAN) 8 MG tablet Take 1 tablet (8 mg total) by mouth every 8 (eight) hours as needed for nausea or vomiting. Start on the third day after cisplatin. 30 tablet 1   oxyCODONE (OXY IR/ROXICODONE) 5 MG immediate release tablet Take 1 tablet (5 mg total) by mouth every 4 (four) hours as needed for severe pain. 60 tablet 0   prochlorperazine (COMPAZINE) 10 MG tablet Take 1 tablet (10 mg total) by mouth every 6 (six) hours as needed (Nausea or vomiting). 30 tablet 1   senna-docusate (SENNA S) 8.6-50 MG tablet Take 2 tablets by mouth at bedtime. 60 tablet 2   sucralfate (CARAFATE) 1 g tablet Take 1 tablet (1 g total) by mouth with breakfast, with lunch, and with evening meal. Dissolve tab in in 20ml of  water and slowly swallow 90 tablet 1   valACYclovir (VALTREX) 500 MG tablet 1000 mg po twice daily for 7 days then 500mg  po BID for shingles prophylaxis while on chemotherapy and 1 additional month. 60 tablet 1   No Hudson facility-administered medications for this visit.    REVIEW OF SYSTEMS:    10 Point review of Systems was done is negative except as noted above. Patient notes some mild dry mouth  PHYSICAL EXAMINATION: .BP (!) 159/88   Pulse (!) 59   Temp (!) 97.2 F (36.2 C)   Resp 20   Wt 214 lb 4.8 oz (97.2 kg)   SpO2 100%   BMI 27.51 kg/m   GENERAL:alert, in no acute distress and comfortable SKIN: no acute rashes, no significant lesions EYES: conjunctiva are pink and non-injected, sclera anicteric OROPHARYNX: MMM, no exudates, no oropharyngeal erythema or ulceration NECK: supple, no JVD LYMPH: left neck enlarged Lymph nodal mass--stable. LUNGS: clear to auscultation b/l with normal respiratory effort HEART: regular rate & rhythm ABDOMEN:  normoactive bowel sounds , non tender, not distended. Extremity: no pedal edema PSYCH: alert & oriented x 3 with fluent speech NEURO: no focal motor/sensory deficits   LABORATORY DATA:  I have reviewed the data as listed .    Latest Ref Rng & Units 04/21/2023   12:12 PM 02/19/2023   12:25 PM 01/22/2023    8:51 AM  CBC  WBC 4.0 - 10.5 K/uL 4.7  5.0  2.5   Hemoglobin 13.0 - 17.0 g/dL 51.8  84.1  66.0   Hematocrit 39.0 - 52.0 % 38.3  35.3  35.4   Platelets 150 - 400 K/uL 213  330  89    .    Latest Ref Rng & Units 04/21/2023   12:12 PM 02/19/2023   12:25 PM 01/22/2023    8:51 AM  CMP  Glucose 70 - 99 mg/dL 630  86  98   BUN 6 - 20 mg/dL 16  14  18    Creatinine 0.61 - 1.24 mg/dL 1.60  1.09  3.23   Sodium 135 - 145 mmol/L 139  139  137   Potassium 3.5 - 5.1 mmol/L 4.0  4.3  4.4   Chloride  98 - 111 mmol/L 107  104  99   CO2 22 - 32 mmol/L 28  29  30    Calcium 8.9 - 10.3 mg/dL 9.2  9.7  9.1   Total Protein 6.5 - 8.1 g/dL  6.6  6.9  6.7   Total Bilirubin 0.3 - 1.2 mg/dL 0.5  0.4  0.3   Alkaline Phos 38 - 126 U/L 65  48  53   AST 15 - 41 U/L 19  25  33   ALT 0 - 44 U/L 16  21  54    Magnesium 2 . Lab Results  Component Value Date   LDH 272 (H) 04/21/2023     SURGICAL PATHOLOGY  CASE: [id number]  PATIENT: Jonathan Hudson  Surgical Pathology Report   Specimen Submitted:  A. Lymph node, left neck   Clinical History: Necrotic node CT, possible squamous cell.  No history  of CA   DIAGNOSIS:  A. LYMPH NODE, LEFT NECK; ULTRASOUND-GUIDED BIOPSY:  - METASTATIC SQUAMOUS CELL CARCINOMA, P16 POSITIVE.  - NECROTIC DEBRIS.   Comment:  The carcinoma is positive for p63 and p16. This pattern of staining  supports the above diagnosis.  There is insufficient material for ancillary molecular testing.   . Lab Results  Component Value Date   LDH 272 (H) 04/21/2023   Component     Latest Ref Rng 10/13/2022  HIV Screen 4th Generation wRfx     Non Reactive  Non Reactive   HCV Ab     NON REACTIVE  NON REACTIVE   Hepatitis B Surface Ag     NON REACTIVE  NON REACTIVE   Hep B S Ab     NON REACTIVE  NON REACTIVE   Hep B Core Total Ab     NON REACTIVE  NON REACTIVE   CRP     <1.0 mg/dL <6.2   Sed Rate     0 - 16 mm/hr 7   LDH     98 - 192 U/L 198 (H)     Legend: (H) High   RADIOGRAPHIC STUDIES: I have personally reviewed the radiological images as listed and agreed with the findings in the report.  NM PET Image Restag (PS) Skull Base To Thigh  Result Date: 04/20/2023 CLINICAL DATA:  Subsequent treatment strategy for tonsillar cancer. EXAM: NUCLEAR MEDICINE PET SKULL BASE TO THIGH TECHNIQUE: 11.9 mCi F-18 FDG was injected intravenously. Full-ring PET imaging was performed from the skull base to thigh after the radiotracer. CT data was obtained and used for attenuation correction and anatomic localization. Fasting blood glucose: 91 mg/dl COMPARISON:  CT chest 13/06/6577 and PET 11/13/2022.  FINDINGS: Mediastinal blood pool activity: SUV max 2.8 Liver activity: SUV max NA NECK: Necrotic left level II lymph node measures 3.2 x 3.2 cm (4/25), stable in size and without associated hypermetabolism. Residual mildly hypermetabolic left level II/III lymph nodes measure up to 11 mm in the level II station (4/20), SUV max 2.8. Incidental CT findings: None. CHEST: No abnormal hypermetabolism. Incidental CT findings: Right IJ Port-A-Cath terminates in the right atrium. Heart is enlarged. No pericardial or pleural effusion. ABDOMEN/PELVIS: No abnormal hypermetabolism. Incidental CT findings: Hepatic cysts. Minimally hypoattenuating lesion in the left hepatic lobe measures 2.6 cm, possibly a hemangioma. Liver, gallbladder, adrenal glands, kidneys, spleen, pancreas stomach and bowel are otherwise grossly unremarkable with the exception of a percutaneous gastrostomy. SKELETON: No abnormal hypermetabolism. Incidental CT findings: None. IMPRESSION: Interval response to therapy as evidenced by decrease in size and  hypermetabolism of left cervical lymph nodes. No residual tonsillar uptake. Electronically Signed   By: Leanna Battles M.D.   On: 04/20/2023 14:53    ASSESSMENT & PLAN  Jonathan Hudson is a 57 y.o. 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 Primary site unclear at this time but is likely oropharyngeal given HPV positivity. Underwent IMRT and weekly Cisplatin #2  Radiation mucositis grade 2 #3 G-tube in place with mild serous/seropurulent discharge.  No induration or signs of infection. #4 mild protein calorie malnutrition -using tube feeding and hydration regularly at this time. #5 early herpes zoster along the cheek.  PLAN: -Patient's labs were discussed with him in detail. CBC and CMP are unrevealing. PET scan shows resolution of hypermetabolism at the primary tonsillar site and no overt hypermetabolic is not in his left cervical  lymphadenopathy. -Imaging reviewed with the patient. -We discussed we need to monitor this closely and his case was presented and in the tumor board to ensure he does not need consideration of resection of residual lymph nodes. -Anticipate that he can be monitored closely.  Will need to follow-up closely with ENT -Would recommend getting repeat CT neck in 4 to 6 months -Return to clinic with me in 4 to 6 months -Would keep the port in for now and flush every 2 months -If no other plans were being to tumor board could consider taking out his G-tube.  FOLLOW-UP: Return to clinic with Dr. Candise Che with port flush and labs in 6 months Port flush every 2 months  The total time spent in the appointment was 40 minutes*.  All of the patient's questions were answered with apparent satisfaction. The patient knows to call the clinic with any problems, questions or concerns.   Wyvonnia Lora MD MS AAHIVMS [facility] Hematology/Oncology Physician [facility]  .*Total Encounter Time as defined by the Centers for Medicare and Medicaid Services includes, in addition to the face-to-face time of a patient visit (documented in the note above) non-face-to-face time: obtaining and reviewing outside history, ordering and reviewing medications, tests or procedures, care coordination (communications with other health care professionals or caregivers) and documentation in the medical record.

## 2023-05-04 ENCOUNTER — Ambulatory Visit (HOSPITAL_COMMUNITY)
Admission: RE | Admit: 2023-05-04 | Discharge: 2023-05-04 | Disposition: A | Discharge status: Home / Self Care | Payer: 59 | Source: Ambulatory Visit | Attending: Radiology | Admitting: Radiology

## 2023-05-04 DIAGNOSIS — C090 Malignant neoplasm of tonsillar fossa: Secondary | ICD-10-CM | POA: Diagnosis not present

## 2023-05-04 DIAGNOSIS — Z431 Encounter for attention to gastrostomy: Secondary | ICD-10-CM | POA: Diagnosis present

## 2023-05-04 HISTORY — PX: IR GASTROSTOMY TUBE REMOVAL: IMG5492

## 2023-05-15 ENCOUNTER — Other Ambulatory Visit: Payer: Self-pay

## 2023-06-15 ENCOUNTER — Other Ambulatory Visit (HOSPITAL_COMMUNITY): Payer: 59

## 2023-07-21 ENCOUNTER — Inpatient Hospital Stay (HOSPITAL_BASED_OUTPATIENT_CLINIC_OR_DEPARTMENT_OTHER): Payer: 59 | Admitting: Hematology

## 2023-07-21 ENCOUNTER — Inpatient Hospital Stay: Payer: 59 | Attending: Physician Assistant

## 2023-07-21 ENCOUNTER — Other Ambulatory Visit: Payer: Self-pay | Admitting: Hematology

## 2023-07-21 DIAGNOSIS — Z95828 Presence of other vascular implants and grafts: Secondary | ICD-10-CM

## 2023-07-21 DIAGNOSIS — C090 Malignant neoplasm of tonsillar fossa: Secondary | ICD-10-CM

## 2023-07-21 DIAGNOSIS — Z79899 Other long term (current) drug therapy: Secondary | ICD-10-CM | POA: Diagnosis not present

## 2023-07-21 DIAGNOSIS — Z09 Encounter for follow-up examination after completed treatment for conditions other than malignant neoplasm: Secondary | ICD-10-CM | POA: Diagnosis present

## 2023-07-21 LAB — CBC WITH DIFFERENTIAL (CANCER CENTER ONLY)
Abs Immature Granulocytes: 0.01 10*3/uL (ref 0.00–0.07)
Basophils Absolute: 0 10*3/uL (ref 0.0–0.1)
Basophils Relative: 1 %
Eosinophils Absolute: 0.2 10*3/uL (ref 0.0–0.5)
Eosinophils Relative: 3 %
HCT: 39.3 % (ref 39.0–52.0)
Hemoglobin: 13.8 g/dL (ref 13.0–17.0)
Immature Granulocytes: 0 %
Lymphocytes Relative: 39 %
Lymphs Abs: 2.1 10*3/uL (ref 0.7–4.0)
MCH: 31.5 pg (ref 26.0–34.0)
MCHC: 35.1 g/dL (ref 30.0–36.0)
MCV: 89.7 fL (ref 80.0–100.0)
Monocytes Absolute: 0.4 10*3/uL (ref 0.1–1.0)
Monocytes Relative: 8 %
Neutro Abs: 2.6 10*3/uL (ref 1.7–7.7)
Neutrophils Relative %: 49 %
Platelet Count: 230 10*3/uL (ref 150–400)
RBC: 4.38 MIL/uL (ref 4.22–5.81)
RDW: 13.9 % (ref 11.5–15.5)
WBC Count: 5.3 10*3/uL (ref 4.0–10.5)
nRBC: 0 % (ref 0.0–0.2)

## 2023-07-21 LAB — CMP (CANCER CENTER ONLY)
ALT: 16 U/L (ref 0–44)
AST: 20 U/L (ref 15–41)
Albumin: 4 g/dL (ref 3.5–5.0)
Alkaline Phosphatase: 68 U/L (ref 38–126)
Anion gap: 3 — ABNORMAL LOW (ref 5–15)
BUN: 15 mg/dL (ref 6–20)
CO2: 28 mmol/L (ref 22–32)
Calcium: 9.1 mg/dL (ref 8.9–10.3)
Chloride: 107 mmol/L (ref 98–111)
Creatinine: 1.06 mg/dL (ref 0.61–1.24)
GFR, Estimated: >60 mL/min (ref >60)
Glucose, Bld: 94 mg/dL (ref 70–99)
Potassium: 4.3 mmol/L (ref 3.5–5.1)
Sodium: 138 mmol/L (ref 135–145)
Total Bilirubin: 0.5 mg/dL (ref 0.3–1.2)
Total Protein: 7 g/dL (ref 6.5–8.1)

## 2023-07-21 MED ORDER — SODIUM CHLORIDE 0.9% FLUSH
10.0000 mL | Freq: Once | INTRAVENOUS | Status: AC
  Administered 2023-07-21: 10 mL

## 2023-07-21 MED ORDER — HEPARIN SOD (PORK) LOCK FLUSH 100 UNIT/ML IV SOLN
500.0000 [IU] | Freq: Once | INTRAVENOUS | Status: AC
  Administered 2023-07-21: 500 [IU]

## 2023-07-21 NOTE — Progress Notes (Signed)
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 male with medical history significant for hypertension and hypercholesteremia presents to the diagnostic clinic for evaluation for left neck lymphadenopathy.  On review of the previous records, Jonathan Hudson presented to the urgent care on 09/04/2022 with a large painful left neck mass. It was initially felt to be infectious so treated with doxycycline with little improvement. He followed by with his PCP on 09/22/2022 who ordered a ultrasound on 10/01/2022 which revealed a hypoechoic solid mass in the left neck measuring 4.7 x 2.7 x 4.2 cm. CT neck followed on 10/06/2022 which showed bulky and necrotic adenopathy in the left level 2 neck.   On exam today, Jonathan Hudson reports that his energy levels are fairly stable. He is able to complete all his ADLs on his own. He is currently on FMLA while he undergoes workup. He reports having a good appetite and denies any weight loss. He denies nausea, vomiting or abdominal pain. His bowel habits are unchanged without any recurrent episodes of diarrhea or constipation. He denies easy bruising or signs of active bleeding. He reports having intermittent episodes of positional dizziness without any syncopal episodes.He denies fevers, chills, night sweats, shortness of breath, chest pain, cough, peripheral edema, neuropathy, pruritus  or rash. He has no other complaints. Rest of the 10 point ROS is below.   INTERVAL HISTORY:  Jonathan Hudson is he is here for follow-up with his wife for continued evaluation and management of his squamous cell head and neck cancer. Patient was last seen by me on 04/21/2023 and he was doing well overall.   .I connected with Jonathan Hudson on 07/21/2023 at  3:30 PM EDT by telephone visit and verified that I am speaking with the correct person using two identifiers.   Patient notes he has been doing well overall without any new or severe medical concerns since our last visit. He denies any new infection issues, fever, chills, night sweats, chest pain, swallowing problem, abnormal bowel movement, abdominal pain, or leg swelling.   He has been eating well and has been staying well hydrated.   Patient notes he has started taking blood pressure medication, Benicar 40 mg, since last week. He does not take any pain medication.   I discussed the limitations, risks, security and privacy concerns of performing an evaluation and management service by telemedicine and the availability of in-person appointments. I also discussed with the patient that there may be a patient responsible charge related to this service. The patient expressed understanding and agreed to proceed.   Other persons participating in the visit and their role in the encounter: None   Patient's location: Home  Provider's location: [facility]   Chief Complaint: continued evaluation and management of his squamous cell head and neck cancer.     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HCC)     SURGICAL  HISTORY: Past Surgical History:  Procedure Laterality Date   COLONOSCOPY N/A 01/29/2017   Procedure: COLONOSCOPY;  Surgeon: Malissa Hippo, MD;  Location: [facility];  Service: Endoscopy;  Laterality: N/A;  730   COLONOSCOPY WITH  PROPOFOL N/A 04/03/2022   Procedure: COLONOSCOPY WITH PROPOFOL;  Surgeon: Malissa Hippo, MD;  Location: [facility];  Service: Endoscopy;  Laterality: N/A;  730   cyst removed from left wrist      EYE SURGERY     grafts N/A 10  years  ago   grafts to gums   HEMOSTASIS CLIP PLACEMENT  04/03/2022   Procedure: HEMOSTASIS CLIP PLACEMENT;  Surgeon: Malissa Hippo, MD;  Location: [facility];  Service: Endoscopy;;   IR GASTROSTOMY TUBE MOD SED  12/15/2022   IR GASTROSTOMY TUBE REMOVAL  05/04/2023   IR IMAGING GUIDED PORT INSERTION  12/15/2022   IR NASO G TUBE PLC W/FL W/RAD  12/15/2022   OPEN REDUCTION INTERNAL FIXATION (ORIF) DISTAL RADIAL FRACTURE Right 12/28/2013   Procedure: OPEN REDUCTION INTERNAL FIXATION (ORIF) DISTAL RADIAL FRACTURE;  Surgeon: Vickki Hearing, MD;  Location: [facility];  Service: Orthopedics;  Laterality: Right;   ORIF ULNAR FRACTURE Right 12/28/2013   Procedure: OPEN REDUCTION INTERNAL FIXATION (ORIF) ULNAR FRACTURE;  Surgeon: Vickki Hearing, MD;  Location: [facility];  Service: Orthopedics;  Laterality: Right;   POLYPECTOMY  04/03/2022   Procedure: POLYPECTOMY;  Surgeon: Malissa Hippo, MD;  Location: [facility];  Service: Endoscopy;;   right knee Right teenager   right knee     SOCIAL HISTORY: Social History   Socioeconomic History   Marital status: Divorced    Spouse name: Not on file   Number of children: Not on file   Years of education: Not on file   Highest education level: Not on file  Occupational History   Not on file  Tobacco Use   Smoking status: Never   Smokeless tobacco: Never  Vaping Use   Vaping status: Never Used  Substance and Sexual Activity   Alcohol use: Yes    Comment: Occ  beer or wine    Drug use: No   Sexual activity: Not on file  Other Topics Concern   Not on file  Social History Narrative   Not on file   Social Determinants of Health   Financial Resource Strain: Low Risk  (12/03/2022)   Overall Financial Resource  Strain (CARDIA)    Difficulty of Paying Living Expenses: Not hard at all  Food Insecurity: Low Risk  (03/20/2023)   [facility]   Food vital sign    Within the past 12 months, you worried that your food would run out before you got money to buy more: Never true    Within the past 12 months, the food you bought just didn't last and you didn't have money to get more. : Never true  Transportation Needs: No Transportation Needs (03/20/2023)   [facility]   Transportation    In the past 12 months, has lack of reliable transportation kept you from medical appointments, meetings, work or from getting things needed for daily living? : No  Physical Activity: Not on file  Stress: No Stress Concern Present (12/03/2022)   [employer] of Occupational Health - Occupational Stress Questionnaire    Feeling of Stress : Not at all  Social Connections: Not on file  Intimate Partner Violence: Not At Risk (12/03/2022)  Humiliation, Afraid, Rape, and Kick questionnaire    Fear of Current or Ex-Partner: No    Emotionally Abused: No    Physically Abused: No    Sexually Abused: No    FAMILY HISTORY: Family History  Problem Relation Age of Onset   Cancer Mother        small bowel spread to liver   Pancreatic cancer Father    Cancer Maternal Aunt    Brain cancer Maternal Uncle    Cancer Paternal Aunt    Breast cancer Cousin    Cancer Paternal Uncle     ALLERGIES:  has No Known Allergies.  MEDICATIONS:  Current Outpatient Medications  Medication Sig Dispense Refill   amLODipine (NORVASC) 10 MG tablet Take 10 mg by mouth daily.     atorvastatin (LIPITOR) 40 MG tablet Take 40 mg by mouth daily.     dexamethasone (DECADRON) 4 MG tablet Take 2 tablets daily x 3 days starting the day after cisplatin chemotherapy. Take with food. 30 tablet 1   fentaNYL (DURAGESIC) 12 MCG/HR Place 1 patch onto the skin every 3 (three) days. 10 patch 0    lansoprazole (PREVACID) 15 MG capsule Take 15 mg by mouth every morning.     lidocaine-prilocaine (EMLA) cream Apply to affected area once 30 g 3   LORazepam (ATIVAN) 0.5 MG tablet Take 1 tablet (0.5 mg total) by mouth every 8 (eight) hours. 30 tablet 0   magic mouthwash (multi-ingredient) oral suspension Take 5 mls by mouth 4 times a day as needed for mouth pain 300 mL 1   Multiple Vitamin (MULTIVITAMIN WITH MINERALS) TABS tablet Take 1 tablet by mouth daily.     neomycin-bacitracin-polymyxin [phone] OINT Apply 1 Application topically 3 (three) times daily. Around G tube opening 15 g 1   olmesartan (BENICAR) 40 MG tablet Take 40 mg by mouth daily.     ondansetron (ZOFRAN) 8 MG tablet Take 1 tablet (8 mg total) by mouth every 8 (eight) hours as needed for nausea or vomiting. Start on the third day after cisplatin. 30 tablet 1   oxyCODONE (OXY IR/ROXICODONE) 5 MG immediate release tablet Take 1 tablet (5 mg total) by mouth every 4 (four) hours as needed for severe pain. 60 tablet 0   prochlorperazine (COMPAZINE) 10 MG tablet Take 1 tablet (10 mg total) by mouth every 6 (six) hours as needed (Nausea or vomiting). 30 tablet 1   senna-docusate (SENNA S) 8.6-50 MG tablet Take 2 tablets by mouth at bedtime. 60 tablet 2   sucralfate (CARAFATE) 1 g tablet Take 1 tablet (1 g total) by mouth with breakfast, with lunch, and with evening meal. Dissolve tab in in 20ml of water and slowly swallow 90 tablet 1   valACYclovir (VALTREX) 500 MG tablet 1000 mg po twice daily for 7 days then 500mg  po BID for shingles prophylaxis while on chemotherapy and 1 additional month. 60 tablet 1   No current facility-administered medications for this visit.    REVIEW OF SYSTEMS:    10 Point review of Systems was done is negative except as noted above. Patient notes some mild dry mouth  PHYSICAL EXAMINATION: VSS GENERAL:alert, in no acute distress and comfortable SKIN: no acute rashes, no significant lesions EYES:  conjunctiva are pink and non-injected, sclera anicteric OROPHARYNX: MMM, no exudates, no oropharyngeal erythema or ulceration NECK: supple, no JVD LYMPH: left neck enlarged Lymph nodal mass--stable. LUNGS: clear to auscultation b/l with normal respiratory effort HEART: regular rate & rhythm ABDOMEN:  normoactive bowel sounds , non tender, not distended. Extremity: no pedal edema PSYCH: alert & oriented x 3 with fluent speech NEURO: no focal motor/sensory deficits   LABORATORY DATA:  I have reviewed the data as listed .    Latest Ref Rng & Units 07/21/2023   11:47 AM 04/21/2023   12:12 PM 02/19/2023   12:25 PM  CBC  WBC 4.0 - 10.5 K/uL 5.3  4.7  5.0   Hemoglobin 13.0 - 17.0 g/dL 16.1  09.6  04.5   Hematocrit 39.0 - 52.0 % 39.3  38.3  35.3   Platelets 150 - 400 K/uL 230  213  330    .    Latest Ref Rng & Units 07/21/2023   11:47 AM 04/21/2023   12:12 PM 02/19/2023   12:25 PM  CMP  Glucose 70 - 99 mg/dL 94  409  86   BUN 6 - 20 mg/dL 15  16  14    Creatinine 0.61 - 1.24 mg/dL 8.11  9.14  7.82   Sodium 135 - 145 mmol/L 138  139  139   Potassium 3.5 - 5.1 mmol/L 4.3  4.0  4.3   Chloride 98 - 111 mmol/L 107  107  104   CO2 22 - 32 mmol/L 28  28  29    Calcium 8.9 - 10.3 mg/dL 9.1  9.2  9.7   Total Protein 6.5 - 8.1 g/dL 7.0  6.6  6.9   Total Bilirubin 0.3 - 1.2 mg/dL 0.5  0.5  0.4   Alkaline Phos 38 - 126 U/L 68  65  48   AST 15 - 41 U/L 20  19  25    ALT 0 - 44 U/L 16  16  21     Magnesium 2 . Lab Results  Component Value Date   LDH 272 (H) 04/21/2023     SURGICAL PATHOLOGY  CASE: [id number]  PATIENT: Cheree Ditto  Surgical Pathology Report   Specimen Submitted:  A. Lymph node, left neck   Clinical History: Necrotic node CT, possible squamous cell.  No history  of CA   DIAGNOSIS:  A. LYMPH NODE, LEFT NECK; ULTRASOUND-GUIDED BIOPSY:  - METASTATIC SQUAMOUS CELL CARCINOMA, P16 POSITIVE.  - NECROTIC DEBRIS.   Comment:  The carcinoma is positive for p63 and  p16. This pattern of staining  supports the above diagnosis.  There is insufficient material for ancillary molecular testing.   . Lab Results  Component Value Date   LDH 272 (H) 04/21/2023   Component     Latest Ref Rng 10/13/2022  HIV Screen 4th Generation wRfx     Non Reactive  Non Reactive   HCV Ab     NON REACTIVE  NON REACTIVE   Hepatitis B Surface Ag     NON REACTIVE  NON REACTIVE   Hep B S Ab     NON REACTIVE  NON REACTIVE   Hep B Core Total Ab     NON REACTIVE  NON REACTIVE   CRP     <1.0 mg/dL <8.4   Sed Rate     0 - 16 mm/hr 7   LDH     98 - 192 U/L 198 (H)     Legend: (H) High   RADIOGRAPHIC STUDIES: I have personally reviewed the radiological images as listed and agreed with the findings in the report.  No results found.  ASSESSMENT & PLAN  Jonathan Hudson is a 57 y.o. 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 Primary site unclear at this time but is likely oropharyngeal given HPV positivity. Underwent IMRT and weekly Cisplatin #2  Radiation mucositis grade 2 #3 G-tube in place with mild serous/seropurulent discharge.  No induration or signs of infection. #4 mild protein calorie malnutrition -using tube feeding and hydration regularly at this time. #5 early herpes zoster along the cheek.  PLAN: -Discussed lab results from today, 07/21/2023, with the patient. CBC is stable. CMP is pending. CBC and CMP are stable.  -Continue to follow-up with PCP. -Will get repeat scans and remove port-a-cath after.  -Repeat CT neck and chest in 16 weeks around January 2025.  -No lab or clinical results suggesting progression.   FOLLOW-UP: Port flush in 2 months CT neck and CT chest in 16 weeks Port flush and labs and MD visit in 18 weeks  The total time spent in the appointment was 20 minutes* .  All of the patient's questions were answered with apparent satisfaction. The patient knows to call the clinic with any  problems, questions or concerns.   Wyvonnia Lora MD MS AAHIVMS [facility] Hematology/Oncology Physician [facility]  .*Total Encounter Time as defined by the Centers for Medicare and Medicaid Services includes, in addition to the face-to-face time of a patient visit (documented in the note above) non-face-to-face time: obtaining and reviewing outside history, ordering and reviewing medications, tests or procedures, care coordination (communications with other health care professionals or caregivers) and documentation in the medical record.   I,Param Shah,acting as a Neurosurgeon for Wyvonnia Lora, MD.,have documented all relevant documentation on the behalf of Wyvonnia Lora, MD,as directed by  Wyvonnia Lora, MD while in the presence of Wyvonnia Lora, MD.   .I have reviewed the above documentation for accuracy and completeness, and I agree with the above. [name] MD

## 2023-07-23 ENCOUNTER — Telehealth: Payer: Self-pay | Admitting: Hematology

## 2023-07-23 NOTE — Telephone Encounter (Signed)
Left patient a message regarding scheduled appointment times/dates

## 2023-07-25 ENCOUNTER — Other Ambulatory Visit: Payer: Self-pay

## 2023-07-27 ENCOUNTER — Encounter: Payer: Self-pay | Admitting: Hematology

## 2023-08-10 ENCOUNTER — Other Ambulatory Visit: Payer: Self-pay

## 2023-09-07 ENCOUNTER — Encounter: Payer: Self-pay | Admitting: Surgery

## 2023-09-07 ENCOUNTER — Inpatient Hospital Stay: Payer: 59 | Attending: Physician Assistant

## 2023-09-07 DIAGNOSIS — Z452 Encounter for adjustment and management of vascular access device: Secondary | ICD-10-CM | POA: Diagnosis present

## 2023-09-07 DIAGNOSIS — Z95828 Presence of other vascular implants and grafts: Secondary | ICD-10-CM

## 2023-09-07 DIAGNOSIS — C090 Malignant neoplasm of tonsillar fossa: Secondary | ICD-10-CM | POA: Insufficient documentation

## 2023-09-07 DIAGNOSIS — Z79899 Other long term (current) drug therapy: Secondary | ICD-10-CM | POA: Diagnosis not present

## 2023-09-07 MED ORDER — SODIUM CHLORIDE 0.9% FLUSH
10.0000 mL | Freq: Once | INTRAVENOUS | Status: AC
  Administered 2023-09-07: 10 mL

## 2023-09-07 MED ORDER — HEPARIN SOD (PORK) LOCK FLUSH 100 UNIT/ML IV SOLN
500.0000 [IU] | Freq: Once | INTRAVENOUS | Status: AC
  Administered 2023-09-07: 500 [IU]

## 2023-10-14 ENCOUNTER — Telehealth: Payer: Self-pay | Admitting: *Deleted

## 2023-10-14 NOTE — Telephone Encounter (Signed)
Called patient to inform of CT on 10-21-23- arrival time=- 3:15 pm @ [facility], no restrictions to scan, patient to receive results from Dr. Basilio Cairo on 10-23-23 @ 11 am for results, lvm for a return call

## 2023-10-20 ENCOUNTER — Ambulatory Visit: Payer: Self-pay | Admitting: Radiation Oncology

## 2023-10-20 ENCOUNTER — Ambulatory Visit: Payer: 59 | Admitting: Radiation Oncology

## 2023-10-21 ENCOUNTER — Ambulatory Visit (HOSPITAL_COMMUNITY): Payer: 59

## 2023-10-23 ENCOUNTER — Ambulatory Visit: Payer: 59 | Admitting: Radiation Oncology

## 2023-10-23 ENCOUNTER — Ambulatory Visit: Payer: Self-pay | Admitting: Radiation Oncology

## 2023-10-28 ENCOUNTER — Other Ambulatory Visit: Payer: Self-pay

## 2023-11-03 ENCOUNTER — Ambulatory Visit (HOSPITAL_COMMUNITY)
Admission: RE | Admit: 2023-11-03 | Discharge: 2023-11-03 | Disposition: A | Discharge status: Home / Self Care | Payer: 59 | Source: Ambulatory Visit | Attending: Hematology | Admitting: Hematology

## 2023-11-03 DIAGNOSIS — C090 Malignant neoplasm of tonsillar fossa: Secondary | ICD-10-CM | POA: Diagnosis present

## 2023-11-03 MED ORDER — IOHEXOL 300 MG/ML  SOLN
75.0000 mL | Freq: Once | INTRAMUSCULAR | Status: AC | PRN
  Administered 2023-11-03: 75 mL via INTRAVENOUS

## 2023-11-16 ENCOUNTER — Other Ambulatory Visit: Payer: Self-pay

## 2023-11-16 DIAGNOSIS — C090 Malignant neoplasm of tonsillar fossa: Secondary | ICD-10-CM

## 2023-11-17 ENCOUNTER — Inpatient Hospital Stay: Payer: 59 | Attending: Physician Assistant

## 2023-11-17 ENCOUNTER — Inpatient Hospital Stay: Payer: 59 | Admitting: Hematology

## 2023-11-17 VITALS — BP 155/94 | HR 61 | Temp 97.7°F | Resp 17 | Wt 217.6 lb

## 2023-11-17 DIAGNOSIS — E441 Mild protein-calorie malnutrition: Secondary | ICD-10-CM | POA: Insufficient documentation

## 2023-11-17 DIAGNOSIS — Z452 Encounter for adjustment and management of vascular access device: Secondary | ICD-10-CM | POA: Insufficient documentation

## 2023-11-17 DIAGNOSIS — B029 Zoster without complications: Secondary | ICD-10-CM | POA: Diagnosis not present

## 2023-11-17 DIAGNOSIS — Z79899 Other long term (current) drug therapy: Secondary | ICD-10-CM | POA: Diagnosis not present

## 2023-11-17 DIAGNOSIS — C090 Malignant neoplasm of tonsillar fossa: Secondary | ICD-10-CM | POA: Diagnosis not present

## 2023-11-17 DIAGNOSIS — K1233 Oral mucositis (ulcerative) due to radiation: Secondary | ICD-10-CM | POA: Insufficient documentation

## 2023-11-17 DIAGNOSIS — Y842 Radiological procedure and radiotherapy as the cause of abnormal reaction of the patient, or of later complication, without mention of misadventure at the time of the procedure: Secondary | ICD-10-CM | POA: Diagnosis not present

## 2023-11-17 DIAGNOSIS — Z95828 Presence of other vascular implants and grafts: Secondary | ICD-10-CM

## 2023-11-17 DIAGNOSIS — K769 Liver disease, unspecified: Secondary | ICD-10-CM

## 2023-11-17 LAB — CMP (CANCER CENTER ONLY)
ALT: 20 U/L (ref 0–44)
AST: 23 U/L (ref 15–41)
Albumin: 4.1 g/dL (ref 3.5–5.0)
Alkaline Phosphatase: 65 U/L (ref 38–126)
Anion gap: 3 — ABNORMAL LOW (ref 5–15)
BUN: 18 mg/dL (ref 6–20)
CO2: 30 mmol/L (ref 22–32)
Calcium: 9.3 mg/dL (ref 8.9–10.3)
Chloride: 106 mmol/L (ref 98–111)
Creatinine: 0.93 mg/dL (ref 0.61–1.24)
GFR, Estimated: >60 mL/min (ref >60)
Glucose, Bld: 100 mg/dL — ABNORMAL HIGH (ref 70–99)
Potassium: 4.1 mmol/L (ref 3.5–5.1)
Sodium: 139 mmol/L (ref 135–145)
Total Bilirubin: 0.3 mg/dL (ref 0.0–1.2)
Total Protein: 7.2 g/dL (ref 6.5–8.1)

## 2023-11-17 LAB — CBC WITH DIFFERENTIAL (CANCER CENTER ONLY)
Abs Immature Granulocytes: 0.01 10*3/uL (ref 0.00–0.07)
Basophils Absolute: 0 10*3/uL (ref 0.0–0.1)
Basophils Relative: 1 %
Eosinophils Absolute: 0.2 10*3/uL (ref 0.0–0.5)
Eosinophils Relative: 3 %
HCT: 40.2 % (ref 39.0–52.0)
Hemoglobin: 13.7 g/dL (ref 13.0–17.0)
Immature Granulocytes: 0 %
Lymphocytes Relative: 32 %
Lymphs Abs: 1.8 10*3/uL (ref 0.7–4.0)
MCH: 30.7 pg (ref 26.0–34.0)
MCHC: 34.1 g/dL (ref 30.0–36.0)
MCV: 90.1 fL (ref 80.0–100.0)
Monocytes Absolute: 0.5 10*3/uL (ref 0.1–1.0)
Monocytes Relative: 8 %
Neutro Abs: 3.1 10*3/uL (ref 1.7–7.7)
Neutrophils Relative %: 56 %
Platelet Count: 266 10*3/uL (ref 150–400)
RBC: 4.46 MIL/uL (ref 4.22–5.81)
RDW: 13.8 % (ref 11.5–15.5)
WBC Count: 5.5 10*3/uL (ref 4.0–10.5)
nRBC: 0 % (ref 0.0–0.2)

## 2023-11-17 MED ORDER — SODIUM CHLORIDE 0.9% FLUSH
10.0000 mL | Freq: Once | INTRAVENOUS | Status: AC
  Administered 2023-11-17: 10 mL

## 2023-11-17 MED ORDER — HEPARIN SOD (PORK) LOCK FLUSH 100 UNIT/ML IV SOLN
500.0000 [IU] | Freq: Once | INTRAVENOUS | Status: AC
  Administered 2023-11-17: 500 [IU]

## 2023-11-17 NOTE — Progress Notes (Signed)
 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 male with medical history significant for hypertension and hypercholesteremia presents to the diagnostic clinic for evaluation for left neck lymphadenopathy.  On review of the previous records, Jonathan Hudson presented to the urgent care on 09/04/2022 with a large painful left neck mass. It was initially felt to be infectious so treated with doxycycline  with little improvement. He followed by with his PCP on 09/22/2022 who ordered a ultrasound on 10/01/2022 which revealed a hypoechoic solid mass in the left neck measuring 4.7 x 2.7 x 4.2 cm. CT neck followed on 10/06/2022 which showed bulky and necrotic adenopathy in the left level 2 neck.   On exam today, Jonathan Hudson reports that his energy levels are fairly stable. He is able to complete all his ADLs on his own. He is currently on FMLA while he undergoes workup. He reports having a good appetite and denies any weight loss. He denies nausea, vomiting or abdominal pain. His bowel habits are unchanged without any recurrent episodes of diarrhea or constipation. He denies easy bruising or signs of active bleeding. He reports having intermittent episodes of positional dizziness without any syncopal episodes.He denies fevers, chills, night sweats, shortness of breath, chest pain, cough, peripheral edema, neuropathy, pruritus or rash.  He has no other complaints. Rest of the 10 point ROS is below.   INTERVAL HISTORY:  Jonathan Hudson is he is here for follow-up with his wife for continued evaluation and management of his squamous cell head and neck cancer.   Patient was last connected via phone on 07/21/2023 and he was doing well overall.   Patient is accompanied by his wife during this visit. Patient notes he has been doing well overall. He notes that his small nodule on his neck disappeared around 3 weeks ago. He denies any new lump/bumps.   He denies any new infection issues, fever, chill, night sweats, unexpected weight loss, back pain, chest pain, abdominal pain, or leg swelling.   He notes he has been eating well and he has been staying well-hydrated. Patient has started working and he has been exercising regularly.   Patient notes his mother had intestinal cancer that metastasized to liver.   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HCC)     SURGICAL HISTORY: Past Surgical History:  Procedure Laterality Date   COLONOSCOPY N/A 01/29/2017   Procedure: COLONOSCOPY;  Surgeon: Claudis RAYMOND Rivet, MD;  [facility];  Service: Endoscopy;  Laterality: N/A;  730   COLONOSCOPY WITH PROPOFOL  N/A 04/03/2022   Procedure: COLONOSCOPY WITH PROPOFOL ;  Surgeon: Rivet Claudis RAYMOND, MD;  [facility]: [facility];  Service: Endoscopy;  Laterality: N/A;  730   cyst removed from left wrist      EYE SURGERY     grafts N/A 10  years  ago   grafts to gums   HEMOSTASIS CLIP PLACEMENT  04/03/2022   Procedure: HEMOSTASIS CLIP PLACEMENT;  Surgeon: Golda Claudis PENNER, MD;  [facility];  Service: Endoscopy;;   IR GASTROSTOMY TUBE MOD SED  12/15/2022   IR GASTROSTOMY TUBE REMOVAL  05/04/2023   IR IMAGING GUIDED PORT INSERTION  12/15/2022   IR NASO G TUBE PLC W/FL W/RAD  12/15/2022   OPEN REDUCTION INTERNAL FIXATION (ORIF)  DISTAL RADIAL FRACTURE Right 12/28/2013   Procedure: OPEN REDUCTION INTERNAL FIXATION (ORIF) DISTAL RADIAL FRACTURE;  Surgeon: Taft FORBES Minerva, MD;  Location: [facility];  Service: Orthopedics;  Laterality: Right;   ORIF ULNAR FRACTURE Right 12/28/2013   Procedure: OPEN REDUCTION INTERNAL FIXATION (ORIF) ULNAR FRACTURE;  Surgeon: Taft FORBES Minerva, MD;  Location: [facility];  Service: Orthopedics;  Laterality: Right;   POLYPECTOMY  04/03/2022   Procedure: POLYPECTOMY;  Surgeon: Golda Claudis PENNER, MD;  [facility];  Service: Endoscopy;;   right knee Right teenager   right knee     SOCIAL HISTORY: Social History   Socioeconomic History   Marital status: Divorced    Spouse name: Not on file   Number of children: Not on file   Years of education: Not on file   Highest education level: Not on file  Occupational History   Not on file  Tobacco Use   Smoking status: Never   Smokeless tobacco: Never  Vaping Use   Vaping status: Never Used  Substance and Sexual Activity   Alcohol use: Yes    Comment: Occ  beer or wine    Drug use: No   Sexual activity: Not on file  Other Topics Concern   Not on file  Social History Narrative   Not on file   Social Drivers of Health   Financial Resource Strain: Low Risk  (12/03/2022)   Overall Financial Resource Strain (CARDIA)    Difficulty of Paying Living Expenses: Not hard at all  Food Insecurity: Low Risk  (03/20/2023)   [facility]   Hunger Vital Sign    Worried About Running Out of Food in the Last Year: Never true    Ran Out of Food in the Last Year: Never true  Transportation Needs: No Transportation Needs (03/20/2023)   [facility]   Transportation    In the past 12 months, has lack of reliable transportation kept you from medical appointments, meetings, work or from getting things needed for daily living? : No  Physical Activity: Not on file  Stress: No Stress Concern  Present (12/03/2022)   [employer] of Occupational Health - Occupational Stress Questionnaire    Feeling of Stress : Not at all  Social Connections: Not on file  Intimate Partner Violence: Not At Risk (12/03/2022)   Humiliation, Afraid, Rape, and Kick questionnaire    Fear of Current or Ex-Partner: No    Emotionally Abused: No    Physically Abused: No    Sexually Abused: No    FAMILY HISTORY: Family History  Problem Relation Age of Onset   Cancer Mother        small bowel spread to liver   Pancreatic cancer Father    Cancer Maternal Aunt    Brain cancer Maternal Uncle    Cancer Paternal Aunt    Breast cancer Cousin    Cancer Paternal Uncle     ALLERGIES:  has no known allergies.  MEDICATIONS:  Current Outpatient Medications  Medication Sig Dispense Refill   amLODipine (NORVASC) 10 MG tablet  Take 10 mg by mouth daily.     atorvastatin  (LIPITOR) 40 MG tablet Take 40 mg by mouth daily.     lidocaine -prilocaine  (EMLA ) cream Apply to affected area once 30 g 3   olmesartan (BENICAR) 40 MG tablet Take 40 mg by mouth daily.     No current facility-administered medications for this visit.    REVIEW OF SYSTEMS:    10 Point review of Systems was done is negative except as noted above. Patient notes some mild dry mouth  PHYSICAL EXAMINATION: .BP (!) 155/94 (BP Location: Right Arm, Patient Position: Sitting)   Pulse 61   Temp 97.7 F (36.5 C) (Temporal)   Resp 17   Wt 217 lb 9.6 oz (98.7 kg)   SpO2 100%   BMI 27.94 kg/m   GENERAL:alert, in no acute distress and comfortable SKIN: no acute rashes, no significant lesions EYES: conjunctiva are pink and non-injected, sclera anicteric OROPHARYNX: MMM, no exudates, no oropharyngeal erythema or ulceration NECK: supple, no JVD LYMPH: left neck enlarged Lymph nodal mass--stable. LUNGS: clear to auscultation b/l with normal respiratory effort HEART: regular rate & rhythm ABDOMEN:  normoactive bowel sounds , non tender, not  distended. Extremity: no pedal edema PSYCH: alert & oriented x 3 with fluent speech NEURO: no focal motor/sensory deficits   LABORATORY DATA:  I have reviewed the data as listed .SABRA    Latest Ref Rng & Units 11/17/2023    9:49 AM 07/21/2023   11:47 AM 04/21/2023   12:12 PM  CBC  WBC 4.0 - 10.5 K/uL 5.5  5.3  4.7   Hemoglobin 13.0 - 17.0 g/dL 86.2  86.1  86.8   Hematocrit 39.0 - 52.0 % 40.2  39.3  38.3   Platelets 150 - 400 K/uL 266  230  213    .    Latest Ref Rng & Units 11/17/2023    9:49 AM 07/21/2023   11:47 AM 04/21/2023   12:12 PM  CMP  Glucose 70 - 99 mg/dL 899  94  897   BUN 6 - 20 mg/dL 18  15  16    Creatinine 0.61 - 1.24 mg/dL 9.06  8.93  9.01   Sodium 135 - 145 mmol/L 139  138  139   Potassium 3.5 - 5.1 mmol/L 4.1  4.3  4.0   Chloride 98 - 111 mmol/L 106  107  107   CO2 22 - 32 mmol/L 30  28  28    Calcium  8.9 - 10.3 mg/dL 9.3  9.1  9.2   Total Protein 6.5 - 8.1 g/dL 7.2  7.0  6.6   Total Bilirubin 0.0 - 1.2 mg/dL 0.3  0.5  0.5   Alkaline Phos 38 - 126 U/L 65  68  65   AST 15 - 41 U/L 23  20  19    ALT 0 - 44 U/L 20  16  16       Magnesium  2   SURGICAL PATHOLOGY  CASE: [id number]  PATIENT: Jonathan Hudson  Surgical Pathology Report   Specimen Submitted:  A. Lymph node, left neck   Clinical History: Necrotic node CT, possible squamous cell.  No history  of CA   DIAGNOSIS:  A. LYMPH NODE, LEFT NECK; ULTRASOUND-GUIDED BIOPSY:  - METASTATIC SQUAMOUS CELL CARCINOMA, P16 POSITIVE.  - NECROTIC DEBRIS.   Comment:  The carcinoma is positive for p63 and p16. This pattern of staining  supports the above diagnosis.  There is insufficient material for ancillary molecular testing.   SABRA  Lab Results  Component Value Date   LDH 272 (H) 04/21/2023   Component     Latest Ref Rng 10/13/2022  HIV Screen 4th Generation wRfx     Non Reactive  Non Reactive   HCV Ab     NON REACTIVE  NON REACTIVE   Hepatitis B Surface Ag     NON REACTIVE  NON REACTIVE   Hep B  S Ab     NON REACTIVE  NON REACTIVE   Hep B Core Total Ab     NON REACTIVE  NON REACTIVE   CRP     <1.0 mg/dL <9.4   Sed Rate     0 - 16 mm/hr 7   LDH     98 - 192 U/L 198 (H)     Legend: (H) High   RADIOGRAPHIC STUDIES: I have personally reviewed the radiological images as listed and agreed with the findings in the report.  CT Soft Tissue Neck W Contrast Result Date: 11/12/2023 CLINICAL DATA:  Head and neck cancer. Staging follow-up. Carcinoma of the tonsillar fossa. EXAM: CT NECK WITH CONTRAST TECHNIQUE: Multidetector CT imaging of the neck was performed using the standard protocol following the bolus administration of intravenous contrast. RADIATION DOSE REDUCTION: This exam was performed according to the departmental dose-optimization program which includes automated exposure control, adjustment of the mA and/or kV according to patient size and/or use of iterative reconstruction technique. CONTRAST:  75mL OMNIPAQUE  IOHEXOL  300 MG/ML  SOLN COMPARISON:  CT neck with contrast 10/06/2022.  PET scan 04/16/2023. FINDINGS: Pharynx and larynx: No focal mucosal or submucosal lesions are present. The nasopharynx is clear. The soft palate and tongue base are within normal limits. Vallecula and epiglottis are within normal limits. Aryepiglottic folds and piriform sinuses are clear. Vocal cords are midline and symmetric. Trachea is clear. Salivary glands: The submandibular and parotid glands and ducts are within normal limits. Thyroid : Normal Lymph nodes: A left level 2 low-density lymph node has decreased in size from the neck CT 10/06/2022 and PET scan 04/16/2023, now measuring 2.4 x 1.8 x 2.4 cm. No new or enlarging nodes are present. Vascular: No significant vascular disease is present. No stenosis or aneurysm is present. No dissection is present. Limited intracranial: Within normal limits bilaterally. Mild prominence of the extra-axial spaces anteriorly is stable. Visualized orbits: The globes and  orbits are within normal limits. Mastoids and visualized paranasal sinuses: The paranasal sinuses and mastoid air cells are clear. Skeleton: Degenerative endplate changes and uncovertebral spurring is present at C5-6 and C6-7 with foraminal narrowing bilaterally at both levels. No acute fractures are present. No focal osseous lesions are present. Upper chest: Lung apices are clear. The thoracic inlet is within normal limits. IMPRESSION: 1. Interval decrease in size of left level 2 low-density lymph node, now measuring 2.4 x 1.8 x 2.4 cm. This suggest continued response to therapy. 2. No new or enlarging nodes. 3. Degenerative changes of the cervical spine at C5-6 and C6-7 with foraminal narrowing bilaterally at both levels. Electronically Signed   By: Lonni Necessary M.D.   On: 11/12/2023 16:27   CT Chest W Contrast Result Date: 11/12/2023 CLINICAL DATA:  Carcinoma of tonsillar fossa.  Staging. EXAM: CT CHEST WITH CONTRAST TECHNIQUE: Multidetector CT imaging of the chest was performed during intravenous contrast administration. RADIATION DOSE REDUCTION: This exam was performed according to the departmental dose-optimization program which includes automated exposure control, adjustment of the mA and/or kV according to patient size and/or use of iterative  reconstruction technique. CONTRAST:  75mL OMNIPAQUE  IOHEXOL  300 MG/ML  SOLN COMPARISON:  Chest CT dated 12/23/2022.  PET CT dated 04/16/2023. FINDINGS: Cardiovascular: There is no cardiomegaly or pericardial effusion. Mild coronary vascular calcification of the LAD and left coronary artery. The thoracic aorta is unremarkable. The origins of the great vessels of the aortic arch and the central pulmonary arteries appear patent. Right-sided Port-A-Cath with tip at cavoatrial junction. Mediastinum/Nodes: No hilar or mediastinal adenopathy. The esophagus and the thyroid  gland are grossly unremarkable. No mediastinal fluid collection. Lungs/Pleura: No focal  consolidation, pleural effusion, or pneumothorax. The central airways are patent. Upper Abdomen: Multiple liver cysts. 2 enhancing lesions in the left lobe of the liver measure up to 2.7 cm in segment II and 1.2 cm in segment IV, not evaluated on this CT. Musculoskeletal: No acute osseous pathology. IMPRESSION: 1. No acute intrathoracic pathology. No evidence of metastatic disease in the chest. 2. Mild coronary vascular calcification of the LAD and left coronary artery. 3. Indeterminate enhancing lesions in the left lobe of the liver. Electronically Signed   By: Vanetta Chou M.D.   On: 11/12/2023 16:26    ASSESSMENT & PLAN  SYLVANUS TELFORD is a 58 y.o. 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 Primary site unclear at this time but is likely oropharyngeal given HPV positivity. Underwent IMRT and weekly Cisplatin  #2  Radiation mucositis grade 2 #3 G-tube in place with mild serous/seropurulent discharge.  No induration or signs of infection. #4 mild protein calorie malnutrition -using tube feeding and hydration regularly at this time. #5 early herpes zoster along the cheek.  PLAN: -Discussed lab results from today, 11/17/2023, in detail with the patient. CBC stable. CMP pending.  -Discussed CT chest and neck results from 11/03/2023 in detail with the patient. Showed No acute intrathoracic pathology. No evidence of metastatic disease in the chest. Mild coronary vascular calcification of the LAD and left coronary artery. Indeterminate enhancing lesions in the left lobe of the liver. Interval decrease in size of left level 2 low-density lymph node, now measuring 2.4 x 1.8 x 2.4 cm. This suggest continued response to therapy. No new or enlarging nodes. Degenerative changes of the cervical spine at C5-6 and C6-7 with foraminal narrowing bilaterally at both levels. -Discussed the option of liver MRI for the liver cyst or continue with observation. Patient will  discuss with his family and let us  know if he wants MRI. nHe did decide to get MRI liver -Schedule patient to remove his port-a-cath.  -Continue to follow-up with PCP. -No lab or clinical results suggesting progression.  -Answered all of patient's questions.   FOLLOW-UP: IR for port a cath removal (patient prefers 1/20) MRI abd w and wo contrast RTC with Dr Onesimo with labs in 6 months   The total time spent in the appointment was 30 minutes* .  All of the patient's questions were answered with apparent satisfaction. The patient knows to call the clinic with any problems, questions or concerns.   Emaline Onesimo MD MS AAHIVMS [facility] Hematology/Oncology Physician [facility]  .*Total Encounter Time as defined by the Centers for Medicare and Medicaid Services includes, in addition to the face-to-face time of a patient visit (documented in the note above) non-face-to-face time: obtaining and reviewing outside history, ordering and reviewing medications, tests or procedures, care coordination (communications with other health care professionals or caregivers) and documentation in the medical record.   I,Param Shah,acting as a  scribe for Emaline Saran, MD.,have documented all relevant documentation on the behalf of Emaline Saran, MD,as directed by  Emaline Saran, MD while in the presence of Emaline Saran, MD.  .I have reviewed the above documentation for accuracy and completeness, and I agree with the above. .Season Astacio Kishore Margareth Kanner MD

## 2023-11-18 ENCOUNTER — Other Ambulatory Visit: Payer: Self-pay

## 2023-11-18 DIAGNOSIS — K769 Liver disease, unspecified: Secondary | ICD-10-CM

## 2023-11-18 DIAGNOSIS — C090 Malignant neoplasm of tonsillar fossa: Secondary | ICD-10-CM

## 2023-11-19 ENCOUNTER — Other Ambulatory Visit: Payer: Self-pay

## 2023-11-22 ENCOUNTER — Ambulatory Visit (HOSPITAL_COMMUNITY)
Admission: RE | Admit: 2023-11-22 | Discharge: 2023-11-22 | Disposition: A | Discharge status: Home / Self Care | Payer: 59 | Source: Ambulatory Visit | Attending: Hematology | Admitting: Hematology

## 2023-11-22 DIAGNOSIS — K769 Liver disease, unspecified: Secondary | ICD-10-CM

## 2023-11-22 DIAGNOSIS — C090 Malignant neoplasm of tonsillar fossa: Secondary | ICD-10-CM | POA: Diagnosis present

## 2023-11-22 MED ORDER — GADOBUTROL 1 MMOL/ML IV SOLN
7.5000 mL | Freq: Once | INTRAVENOUS | Status: AC | PRN
  Administered 2023-11-22: 7.5 mL via INTRAVENOUS

## 2023-11-23 ENCOUNTER — Encounter: Payer: Self-pay | Admitting: Hematology

## 2023-11-29 ENCOUNTER — Other Ambulatory Visit: Payer: Self-pay

## 2023-12-21 ENCOUNTER — Ambulatory Visit (HOSPITAL_COMMUNITY)
Admission: RE | Admit: 2023-12-21 | Discharge: 2023-12-21 | Disposition: A | Discharge status: Home / Self Care | Payer: 59 | Source: Ambulatory Visit | Attending: Hematology | Admitting: Hematology

## 2023-12-21 DIAGNOSIS — Z452 Encounter for adjustment and management of vascular access device: Secondary | ICD-10-CM | POA: Insufficient documentation

## 2023-12-21 DIAGNOSIS — Z95828 Presence of other vascular implants and grafts: Secondary | ICD-10-CM

## 2023-12-21 HISTORY — PX: IR REMOVAL TUN ACCESS W/ PORT W/O FL MOD SED: IMG2290

## 2023-12-21 MED ORDER — LIDOCAINE-EPINEPHRINE 1 %-1:100000 IJ SOLN
20.0000 mL | Freq: Once | INTRAMUSCULAR | Status: AC
  Administered 2023-12-21: 20 mL via INTRADERMAL

## 2023-12-21 MED ORDER — LIDOCAINE-EPINEPHRINE 1 %-1:100000 IJ SOLN
INTRAMUSCULAR | Status: AC
  Filled 2023-12-21: qty 1

## 2023-12-23 ENCOUNTER — Other Ambulatory Visit (HOSPITAL_COMMUNITY): Payer: Self-pay

## 2024-02-12 NOTE — Progress Notes (Incomplete)
 Jonathan Hudson presents today for follow up for completion of radiation therapy. He completed radiation treatment of tonsillar cancer on 01/23/2023.  Pain issues, if any: *** Using a feeding tube?: *** Weight changes, if any: *** Swallowing issues, if any: *** Smoking or chewing tobacco? *** Using fluoride toothpaste daily? *** Last ENT visit was on: *** Other notable issues, if any: ***

## 2024-02-16 ENCOUNTER — Ambulatory Visit
Admission: RE | Admit: 2024-02-16 | Discharge: 2024-02-16 | Disposition: A | Discharge status: Home / Self Care | Payer: Self-pay | Source: Ambulatory Visit | Attending: Radiation Oncology | Admitting: Radiation Oncology

## 2024-02-16 ENCOUNTER — Ambulatory Visit
Admission: RE | Admit: 2024-02-16 | Discharge: 2024-02-16 | Disposition: A | Discharge status: Home / Self Care | Source: Ambulatory Visit | Attending: Radiation Oncology | Admitting: Radiation Oncology

## 2024-02-16 VITALS — BP 155/93 | HR 66 | Temp 97.4°F | Resp 18 | Ht 74.0 in | Wt 219.4 lb

## 2024-02-16 DIAGNOSIS — C090 Malignant neoplasm of tonsillar fossa: Secondary | ICD-10-CM | POA: Diagnosis present

## 2024-02-16 DIAGNOSIS — Z85818 Personal history of malignant neoplasm of other sites of lip, oral cavity, and pharynx: Secondary | ICD-10-CM | POA: Diagnosis present

## 2024-02-16 DIAGNOSIS — N63 Unspecified lump in unspecified breast: Secondary | ICD-10-CM | POA: Diagnosis not present

## 2024-02-16 DIAGNOSIS — Z79899 Other long term (current) drug therapy: Secondary | ICD-10-CM | POA: Diagnosis not present

## 2024-02-16 NOTE — Progress Notes (Signed)
 Radiation Oncology         [phone] ________________________________  Name: Jonathan Hudson MRN: [id number]  Date: 02/16/2024  DOB: August 27, 1966  Follow-Up Visit Note  CC: Jonathan Bickers, MD  Jonathan Israel, MD  Diagnosis and Prior Radiotherapy:       ICD-10-CM   1. 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 TSH    CT Chest W Contrast    CT Soft Tissue Neck W Contrast    2. Breast nodule  N63.0 Ambulatory referral to General Surgery       Cancer Staging  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HCC) Staging form: Pharynx - HPV-Mediated Oropharynx, AJCC 8th Edition - Clinical stage from 11/18/2022: Stage I (cT1, cN1, cM0) - Unsigned Stage prefix: Initial diagnosis   First Treatment Date: 2022-12-08 - Last Treatment Date: 2023-01-23   Plan Name: [id number]_Tonsil Site: Tonsil, Left Technique: IMRT Mode: Photon Dose Per Fraction: 2 Gy Prescribed Dose (Delivered / Prescribed): 70 Gy / 70 Gy Prescribed Fxs (Delivered / Prescribed): 35 / 35     CHIEF COMPLAINT:  Here for follow-up and surveillance of tonsil cancer  Narrative:  Mr. Jonathan Hudson presents today for follow up for completion of radiation therapy. He completed treatment of tonsill cancer on 01/23/23.            Pain issues, if any: Denies Using a feeding tube?: feeding Tube removed Weight changes, if any: Patient gained weight  Swallowing issues, if any: No issues swallowing foods or fluids. Smoking or chewing tobacco? Denies Using fluoride toothpaste daily? Yes, Last ENT visit was on: July, 2025. Other notable issues, if any: Patient notes a breast nodule that has started to cause pain. This nodule was biopsied in 2015 and he was told to return if it became symptomatic.   Patient last saw Dr. Donalee Fruits on 12/21/2023.   PE: "Healthy appearing in no distress. Breathing and voice are clear. Nasal exam is unremarkable. Oral cavity and pharynx reveals healthy mucosa. No new lesions identified. Tongue mobility is preserved. Indirect  exam of the base of tongue, hypopharynx and larynx is normal and symmetric. No new palpable masses or adenopathy in the neck. Fibrosis of the left level 2 region but no distinct mass palpable."  Impression/Plan: "No evidence of recurrent disease. Most recent CT in December revealed significant reduction in size of the nodal mass. Since then he has noticed himself that it seems to be completely gone now. There is really nothing distinctly palpable. Continue to monitor. Recheck 3 months or sooner if needed."  ALLERGIES:  has no known allergies.  Meds: Current Outpatient Medications  Medication Sig Dispense Refill   amLODipine (NORVASC) 10 MG tablet Take 10 mg by mouth daily.     atorvastatin (LIPITOR) 40 MG tablet Take 40 mg by mouth daily.     lidocaine-prilocaine (EMLA) cream Apply to affected area once 30 g 3   olmesartan (BENICAR) 40 MG tablet Take 40 mg by mouth daily.     No current facility-administered medications for this encounter.    Physical Findings:  The patient is in no acute distress. Patient is alert and oriented. Wt Readings from Last 3 Encounters:  02/16/24 219 lb 6.4 oz (99.5 kg)  11/17/23 217 lb 9.6 oz (98.7 kg)  04/21/23 214 lb 2 oz (97.1 kg)    height is 6\' 2"  (1.88 m) and weight is 219 lb 6.4 oz (99.5 kg). His temperature is 97.4 F (36.3 C) (abnormal). His blood pressure is 155/93 (abnormal) and his  pulse is 66. His respiration is 18 and oxygen saturation is 100%. .  General: Alert and oriented, in no acute distress HEENT: Head is normocephalic. Extraocular movements are intact. Oropharynx is clear- no thrush.  Neck: Neck is notable for no masses Skin: Skin in treatment fields shows satisfactory healing over his neck.   Abdomen: PEG tube removed Chest: palpable nodule in the UOQ of the left chest wall. No palpable axillary adenopathy. No skin changes or nipple discharge.  Extremities: No cyanosis or edema. Lymphatics: see Neck Exam Psychiatric: Judgment and  insight are intact. Affect is appropriate. Breast: mobile smooth subcutaneous mass, lateral right breast, superficial, ~1.5 cm  Lab Findings: Lab Results  Component Value Date   WBC 5.5 11/17/2023   HGB 13.7 11/17/2023   HCT 40.2 11/17/2023   MCV 90.1 11/17/2023   PLT 266 11/17/2023    Lab Results  Component Value Date   TSH 1.877 02/16/2024    Radiographic Findings: No results found.  Impression/Plan:    1) Head and Neck Cancer Status: Patient is healing very well. We are very pleased with his progress to date - no evidence of disease recurrence on clinical exam today.   2) Nutritional Status: Patient is eating well at this point.  Wt Readings from Last 3 Encounters:  02/16/24 219 lb 6.4 oz (99.5 kg)  11/17/23 217 lb 9.6 oz (98.7 kg)  04/21/23 214 lb 2 oz (97.1 kg)  PEG tube: removed   3) Risk Factors: The patient has been educated about risk factors including alcohol and tobacco abuse; they understand that avoidance of alcohol and tobacco is important to prevent recurrences as well as other cancers. He is no longer smoking tobacco. He drinks very occasionally.   4) Swallowing: Patient is swallowing well - know to continue with SLP exercises   5) Dental: Encouraged to continue regular followup with dentistry, flossing, and dental hygiene including fluoride rinses or fluoride toothpaste. He is following with his dentist closely.   6) Thyroid function: Checking annually. Patient will see his PCP in a couple weeks. Recommend annual follow up, he states he will have his PCP monitor this annually to avoid multiple sticks. We will recheck today.  Lab Results  Component Value Date   TSH 1.877 02/16/2024   7) Other: Patient complains of a painful breast nodule that has previously been biopsied as benign years ago but may be growing - states he was previously seen in general surgery and told to return if issues arise. Referral back to general surgery for further evaluation placed  today.   8) Follow-up: Patient is scheduled to see Dr. Donalee Fruits in 1 month and Dr. Salomon Cree in 3 months. CT of the neck and chest in 8 months with an office visit to follow. He was encouraged to call with any issues or questions before then.   I personally spent 30 minutes in this encounter including chart review, reviewing radiological studies, meeting face-to-face with the patient, entering orders and completing documentation. Note signed after encounter date; minutes pertain to date of service, only.  _____________________________________   Julio Ohm, PA-C   Colie Dawes, MD    [facility]  Radiation Oncology Direct Dial: [phone]  Fax: [phone] [email or web address]

## 2024-02-17 ENCOUNTER — Telehealth: Payer: Self-pay | Admitting: Radiation Oncology

## 2024-02-17 ENCOUNTER — Encounter: Payer: Self-pay | Admitting: Radiation Oncology

## 2024-02-17 LAB — TSH: TSH: 1.877 u[IU]/mL (ref 0.350–4.500)

## 2024-02-17 NOTE — Telephone Encounter (Signed)
 4/16 Outgoing referral faxed to [facility].  Will follow up to confirm, if received and date/time patient will be schedule.

## 2024-02-18 ENCOUNTER — Encounter: Payer: Self-pay | Admitting: Hematology

## 2024-02-18 ENCOUNTER — Other Ambulatory Visit: Payer: Self-pay

## 2024-02-22 ENCOUNTER — Telehealth: Payer: Self-pay | Admitting: Radiation Oncology

## 2024-02-22 NOTE — Telephone Encounter (Addendum)
 4/21 @ 9:27 Outgoing referral follow up call/left voicemail to [facility] (Joyce-ref coord) for date/time if patient has been scheduled.

## 2024-03-17 ENCOUNTER — Ambulatory Visit: Payer: Self-pay | Admitting: General Surgery

## 2024-03-18 ENCOUNTER — Other Ambulatory Visit: Payer: Self-pay

## 2024-03-18 ENCOUNTER — Encounter (HOSPITAL_BASED_OUTPATIENT_CLINIC_OR_DEPARTMENT_OTHER): Payer: Self-pay | Admitting: General Surgery

## 2024-03-25 ENCOUNTER — Encounter (HOSPITAL_BASED_OUTPATIENT_CLINIC_OR_DEPARTMENT_OTHER)
Admission: RE | Disposition: A | Discharge status: Home / Self Care | Payer: Self-pay | Source: Home / Self Care | Attending: General Surgery

## 2024-03-25 ENCOUNTER — Other Ambulatory Visit: Payer: Self-pay

## 2024-03-25 ENCOUNTER — Ambulatory Visit (HOSPITAL_BASED_OUTPATIENT_CLINIC_OR_DEPARTMENT_OTHER)
Admission: RE | Admit: 2024-03-25 | Discharge: 2024-03-25 | Disposition: A | Discharge status: Home / Self Care | Attending: General Surgery | Admitting: General Surgery

## 2024-03-25 ENCOUNTER — Encounter (HOSPITAL_BASED_OUTPATIENT_CLINIC_OR_DEPARTMENT_OTHER): Payer: Self-pay | Admitting: Anesthesiology

## 2024-03-25 ENCOUNTER — Encounter (HOSPITAL_BASED_OUTPATIENT_CLINIC_OR_DEPARTMENT_OTHER): Payer: Self-pay | Admitting: General Surgery

## 2024-03-25 DIAGNOSIS — Z79899 Other long term (current) drug therapy: Secondary | ICD-10-CM | POA: Diagnosis not present

## 2024-03-25 DIAGNOSIS — D171 Benign lipomatous neoplasm of skin and subcutaneous tissue of trunk: Secondary | ICD-10-CM | POA: Diagnosis present

## 2024-03-25 DIAGNOSIS — Z87891 Personal history of nicotine dependence: Secondary | ICD-10-CM | POA: Insufficient documentation

## 2024-03-25 DIAGNOSIS — Z85818 Personal history of malignant neoplasm of other sites of lip, oral cavity, and pharynx: Secondary | ICD-10-CM | POA: Diagnosis not present

## 2024-03-25 DIAGNOSIS — I10 Essential (primary) hypertension: Secondary | ICD-10-CM | POA: Diagnosis not present

## 2024-03-25 DIAGNOSIS — Z923 Personal history of irradiation: Secondary | ICD-10-CM | POA: Insufficient documentation

## 2024-03-25 HISTORY — DX: Gastro-esophageal reflux disease without esophagitis: K21.9

## 2024-03-25 HISTORY — PX: BREAST CYST EXCISION: SHX579

## 2024-03-25 SURGERY — EXCISION, CYST, BREAST
Anesthesia: LOCAL | Site: Breast | Laterality: Right

## 2024-03-25 MED ORDER — CHLORHEXIDINE GLUCONATE CLOTH 2 % EX PADS: 6.0000 | MEDICATED_PAD | Freq: Once | CUTANEOUS | Status: DC

## 2024-03-25 MED ORDER — OXYCODONE HCL 5 MG PO TABS: 5.0000 mg | ORAL_TABLET | Freq: Four times a day (QID) | ORAL | 0 refills | Status: AC | PRN

## 2024-03-25 MED ORDER — LIDOCAINE-EPINEPHRINE (PF) 1 %-1:200000 IJ SOLN
INTRAMUSCULAR | Status: DC | PRN
  Administered 2024-03-25: 20 mL via INTRADERMAL

## 2024-03-25 SURGICAL SUPPLY — 41 items
BENZOIN TINCTURE PRP APPL 2/3 (GAUZE/BANDAGES/DRESSINGS) ×2 IMPLANT
BLADE SURG 10 STRL SS (BLADE) ×2 IMPLANT
BLADE SURG 15 STRL LF DISP TIS (BLADE) ×2 IMPLANT
CANISTER SUCT 1200ML W/VALVE (MISCELLANEOUS) IMPLANT
CHLORAPREP W/TINT 26 (MISCELLANEOUS) ×2 IMPLANT
CLEANER CAUTERY TIP PAD (MISCELLANEOUS) ×2 IMPLANT
COVER BACK TABLE 60X90IN (DRAPES) ×2 IMPLANT
COVER MAYO STAND STRL (DRAPES) ×2 IMPLANT
DERMABOND ADVANCED .7 DNX12 (GAUZE/BANDAGES/DRESSINGS) IMPLANT
DRAPE LAPAROTOMY 100X72 PEDS (DRAPES) ×2 IMPLANT
DRAPE UTILITY XL STRL (DRAPES) ×2 IMPLANT
DRSG TEGADERM 4X4.75 (GAUZE/BANDAGES/DRESSINGS) IMPLANT
ELECTRODE REM PT RTRN 9FT ADLT (ELECTROSURGICAL) ×2 IMPLANT
GAUZE 4X4 16PLY ~~LOC~~+RFID DBL (SPONGE) IMPLANT
GAUZE SPONGE 2X2 STRL 8-PLY (GAUZE/BANDAGES/DRESSINGS) IMPLANT
GLOVE BIO SURGEON STRL SZ7.5 (GLOVE) ×2 IMPLANT
GOWN STRL REUS W/ TWL LRG LVL3 (GOWN DISPOSABLE) ×4 IMPLANT
NDL HYPO 25X1 1.5 SAFETY (NEEDLE) IMPLANT
NEEDLE HYPO 25X1 1.5 SAFETY (NEEDLE) IMPLANT
NS IRRIG 1000ML POUR BTL (IV SOLUTION) ×2 IMPLANT
PACK BASIN DAY SURGERY FS (CUSTOM PROCEDURE TRAY) ×2 IMPLANT
PENCIL SMOKE EVACUATOR (MISCELLANEOUS) ×2 IMPLANT
SPIKE FLUID TRANSFER (MISCELLANEOUS) IMPLANT
SPONGE T-LAP 18X18 ~~LOC~~+RFID (SPONGE) ×2 IMPLANT
STRIP CLOSURE SKIN 1/2X4 (GAUZE/BANDAGES/DRESSINGS) ×2 IMPLANT
STRIP CLOSURE SKIN 1/4X4 (GAUZE/BANDAGES/DRESSINGS) IMPLANT
SUT CHROMIC 3 0 SH 27 (SUTURE) IMPLANT
SUT ETHILON 3 0 PS 1 (SUTURE) IMPLANT
SUT MON AB 4-0 PC3 18 (SUTURE) IMPLANT
SUT PROLENE 3 0 PS 2 (SUTURE) IMPLANT
SUT SILK 2 0 PERMA HAND 18 BK (SUTURE) IMPLANT
SUT VIC AB 3-0 54X BRD REEL (SUTURE) IMPLANT
SUT VIC AB 3-0 FS2 27 (SUTURE) IMPLANT
SUT VIC AB 3-0 SH 27X BRD (SUTURE) IMPLANT
SUT VIC AB 4-0 PS2 18 (SUTURE) IMPLANT
SUT VIC AB 4-0 RB1 27X BRD (SUTURE) IMPLANT
SUT VICRYL AB 3 0 TIES (SUTURE) IMPLANT
SYR CONTROL 10ML LL (SYRINGE) IMPLANT
TOWEL GREEN STERILE FF (TOWEL DISPOSABLE) ×4 IMPLANT
TUBE CONNECTING 20X1/4 (TUBING) IMPLANT
YANKAUER SUCT BULB TIP NO VENT (SUCTIONS) IMPLANT

## 2024-03-25 NOTE — H&P (Signed)
 REFERRING PHYSICIAN: Pearlene Bouchard PROVIDER: Arlester Bence, MD MRN: [id number] DOB: [birth year] Subjective    Chief Complaint: New Consultation  History of Present Illness: Jonathan Hudson is a 58 y.o. male who is seen today as an office consultation for evaluation of New Consultation  We are asked to see the patient in consultation by Dr. Julio Ohm to evaluate him for a mass of the right breast. The patient is a 58 year old black male who has had this mass in the upper outer right breast for a number of years. More recently it has gotten a little bit larger and become painful for him. 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 He seems to have recovered well from that  Review of Systems: A complete review of systems was obtained from the patient. I have reviewed this information and discussed as appropriate with the patient. See HPI as well for other ROS.  ROS   Medical History: Past Medical History:  Diagnosis Date  GERD (gastroesophageal reflux disease)  History of cancer  Hypertension   Patient Active Problem List  Diagnosis  Lipoma of breast   Past Surgical History:  Procedure Laterality Date  cyst removed from left wrist  Hemostasis clip placement  Ir naso g tube plc w/fl w/rad  Open reduction internal fixation (orif) distal radial fracture (Right)  Orif ulnar fracture (Right)  Tonsillar cancer    No Known Allergies  Current Outpatient Medications on File Prior to Visit  Medication Sig Dispense Refill  amLODIPine (NORVASC) 10 MG tablet Take 10 mg by mouth once daily  lansoprazole (PREVACID) 15 MG DR capsule Take 15 mg by mouth every morning  melatonin 10 mg Tab Take by mouth  multivitamin tablet Take 1 tablet by mouth once daily   No current facility-administered medications on file prior to visit.   Family History  Problem Relation Age of Onset  High  blood pressure (Hypertension) Mother  Hyperlipidemia (Elevated cholesterol) Mother  High blood pressure (Hypertension) Father  Hyperlipidemia (Elevated cholesterol) Father  High blood pressure (Hypertension) Sister  Hyperlipidemia (Elevated cholesterol) Sister  High blood pressure (Hypertension) Brother  Hyperlipidemia (Elevated cholesterol) Brother    Social History   Tobacco Use  Smoking Status Former  Types: Cigarettes  Start date: 2023  Smokeless Tobacco Never    Social History   Socioeconomic History  Marital status: Divorced  Tobacco Use  Smoking status: Former  Types: Cigarettes  Start date: 2023  Smokeless tobacco: Never  Substance and Sexual Activity  Alcohol use: Yes  Drug use: Never   Social Drivers of Corporate investment banker Strain: Low Risk (12/03/2022)  Received from [facility]  Overall Financial Resource Strain (CARDIA)  Difficulty of Paying Living Expenses: Not hard at all  Food Insecurity: Low Risk (03/20/2023)  Received from [facility]  Hunger Vital Sign  Worried About Running Out of Food in the Last Year: Never true  Ran Out of Food in the Last Year: Never true  Transportation Needs: No Transportation Needs (03/20/2023)  Received from [employer]  In the past 12 months, has lack of reliable transportation kept you from medical appointments, meetings, work or from getting things needed for daily living? : No  Stress: No Stress Concern Present (12/03/2022)  Received from [facility] - Occupational Stress Questionnaire  Feeling of Stress : Not at  all  Housing Stability: Unknown (03/17/2024)  Housing Stability Vital Sign  Homeless in the Last Year: No   Objective:   Vitals:  BP: (!) 142/88  Pulse: 67  Temp: 37.2 C (99 F)  SpO2: 98%  Weight: 99.2 kg (218 lb 12.8 oz)  Height: 189.2 cm (6' 2.5")  PainSc: 0-No pain   Body mass index is 27.72 kg/m.  Physical  Exam Constitutional:  General: He is not in acute distress. Appearance: Normal appearance.  HENT:  Head: Normocephalic and atraumatic.  Right Ear: External ear normal.  Left Ear: External ear normal.  Nose: Nose normal.  Mouth/Throat:  Mouth: Mucous membranes are moist.  Pharynx: Oropharynx is clear.  Eyes:  General: No scleral icterus. Extraocular Movements: Extraocular movements intact.  Conjunctiva/sclera: Conjunctivae normal.  Pupils: Pupils are equal, round, and reactive to light.  Cardiovascular:  Rate and Rhythm: Normal rate and regular rhythm.  Pulses: Normal pulses.  Heart sounds: Normal heart sounds.  Pulmonary:  Effort: Pulmonary effort is normal. No respiratory distress.  Breath sounds: Normal breath sounds.  Abdominal:  General: Abdomen is flat. Bowel sounds are normal. There is no distension.  Palpations: Abdomen is soft.  Tenderness: There is no abdominal tenderness.  Musculoskeletal:  General: No swelling or deformity. Normal range of motion.  Cervical back: Normal range of motion and neck supple. No tenderness.  Skin: General: Skin is warm and dry.  Coloration: Skin is not jaundiced.  Neurological:  General: No focal deficit present.  Mental Status: He is alert and oriented to person, place, and time.  Psychiatric:  Mood and Affect: Mood normal.  Behavior: Behavior normal.     Breast: There is a small round firm palpable mass that is mobile in the outer aspect of the right breast. There is no palpable mass in the left breast. There is no palpable axillary, supraclavicular, or or cervical lymphadenopathy.  Labs, Imaging and Diagnostic Testing:  Assessment and Plan:   Diagnoses and all orders for this visit:  Lipoma of breast - [facility] Case Posting Request; Future   The patient appears to have a lipoma in the upper outer quadrant of the right breast that is very superficial but is starting to enlarge and causing him some discomfort. Because of this he  would like to have it removed and I feel that is a very reasonable thing to do. I have discussed with him in detail the risks and benefits of the operation as well as some of the technical aspects and he understands and wishes to proceed. I think we can do this under local which would be best for him given his history of head neck radiation. We will move forward with surgical scheduling hopefully next Friday

## 2024-03-25 NOTE — Op Note (Signed)
 03/25/2024  1:07 PM  PATIENT:  Jonathan Hudson  58 y.o. male  PRE-OPERATIVE DIAGNOSIS:  LIPOMA RIGHT BREAST  POST-OPERATIVE DIAGNOSIS:  LIPOMA RIGHT BREAST  PROCEDURE:  Procedure(s) with comments: EXCISION, CYST, BREAST (Right) - EXCISION LIPOMA RIGHT BREAST  SURGEON:  Surgeons and Role:    * Caralyn Chandler, MD - Primary  PHYSICIAN ASSISTANT:   ASSISTANTS: none   ANESTHESIA:   local  EBL:  minimal   BLOOD ADMINISTERED:none  DRAINS: none   LOCAL MEDICATIONS USED:  LIDOCAINE    SPECIMEN:  Source of Specimen:  right breast lipoma  DISPOSITION OF SPECIMEN:  PATHOLOGY  COUNTS:  YES  TOURNIQUET:  * No tourniquets in log *  DICTATION: .Dragon Dictation  After informed consent was obtained the patient was brought to the operating room and placed in the supine position on the operating table.  The patient's right breast was prepped with ChloraPrep, allowed to dry, and draped in usual sterile manner.  An appropriate timeout was performed.  The patient had a small palpable fatty mass in the lateral aspect of the right breast.  The area around this was infiltrated with 1% lidocaine  with epinephrine  until a good field block was created.  A small transversely oriented incision was made with a 15 blade knife overlying the palpable mass.  The incision was carried through the skin and subcutaneous tissue sharply with a 15 blade knife until the mass was encountered.  The fatty mass was separated from the rest of the subcutaneous tissue by blunt hemostat dissection.  Once the entire mass was removed it was sent to pathology for further evaluation.  Hemostasis was achieved using the Bovie electrocautery.  The incision was then closed with interrupted 4-0 Monocryl subcuticular stitches.  Dermabond dressings were applied.  The patient tolerated the procedure well.  At the end of the case all needle sponge and instrument counts were correct.  The mass measured 1 x 1 cm.  The patient was then taken  to recovery in stable condition.  PLAN OF CARE: Discharge to home after PACU  PATIENT DISPOSITION:  PACU - hemodynamically stable.   Delay start of Pharmacological VTE agent (>24hrs) due to surgical blood loss or risk of bleeding: not applicable

## 2024-03-25 NOTE — Interval H&P Note (Signed)
 History and Physical Interval Note:  03/25/2024 12:07 PM  Jonathan Hudson  has presented today for surgery, with the diagnosis of LIPOMA RIGHT BREAST.  The various methods of treatment have been discussed with the patient and family. After consideration of risks, benefits and other options for treatment, the patient has consented to  Procedure(s) with comments: EXCISION, CYST, BREAST (Right) - EXCISION LIPOMA RIGHT BREAST as a surgical intervention.  The patient's history has been reviewed, patient examined, no change in status, stable for surgery.  I have reviewed the patient's chart and labs.  Questions were answered to the patient's satisfaction.     Lillette Reid III

## 2024-03-29 ENCOUNTER — Ambulatory Visit: Payer: Self-pay | Admitting: General Surgery

## 2024-03-29 ENCOUNTER — Encounter (HOSPITAL_BASED_OUTPATIENT_CLINIC_OR_DEPARTMENT_OTHER): Payer: Self-pay | Admitting: General Surgery

## 2024-03-29 LAB — SURGICAL PATHOLOGY

## 2024-05-13 ENCOUNTER — Other Ambulatory Visit: Payer: Self-pay

## 2024-05-13 DIAGNOSIS — C090 Malignant neoplasm of tonsillar fossa: Secondary | ICD-10-CM

## 2024-05-15 NOTE — Progress Notes (Signed)
 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 male with medical history significant for hypertension and hypercholesteremia presents to the diagnostic clinic for evaluation for left neck lymphadenopathy.  On review of the previous records, Mr. Hansell presented to the urgent care on 09/04/2022 with a large painful left neck mass. It was initially felt to be infectious so treated with doxycycline  with little improvement. He followed by with his PCP on 09/22/2022 who ordered a ultrasound on 10/01/2022 which revealed a hypoechoic solid mass in the left neck measuring 4.7 x 2.7 x 4.2 cm. CT neck followed on 10/06/2022 which showed bulky and necrotic adenopathy in the left level 2 neck.   On exam today, Mr. Hellmer reports that his energy levels are fairly stable. He is able to complete all his ADLs on his own. He is currently on FMLA while he undergoes workup. He reports having a good appetite and denies any weight loss. He denies nausea, vomiting or abdominal pain. His bowel habits are unchanged without any recurrent episodes of diarrhea or constipation. He denies easy bruising or signs of active bleeding. He reports having intermittent episodes of positional dizziness without any syncopal episodes.He denies fevers, chills, night sweats, shortness of breath, chest pain, cough, peripheral edema, neuropathy, pruritus or rash.  He has no other complaints. Rest of the 10 point ROS is below.   INTERVAL HISTORY:  Jonathan Hudson is a 58 y.o. male here for follow-up for continued evaluation and management of his squamous cell head and neck cancer.   Patient was last seen by me on 11/17/2023 and noted that his previous small nodule on his neck had disappeared. He was doing well overall with no new medical complaints.   Patient notes no acute new symptoms. No new dysphagia. Notes some dry mouth issues. Recent ENT evaluation with no new concerns.   MEDICAL HISTORY:  Past Medical History:  Diagnosis Date   Boils 01/01/2017   GERD (gastroesophageal reflux disease)    Hypercholesteremia    Hypertension    Tonsillar cancer (HCC)     SURGICAL HISTORY: Past Surgical History:  Procedure Laterality Date   BREAST CYST EXCISION Right 03/25/2024   Procedure: EXCISION, CYST, BREAST;  Surgeon: Curvin Deward MOULD, MD;  Location: [facility];  Service: General;  Laterality: Right;  EXCISION LIPOMA RIGHT BREAST   COLONOSCOPY N/A 01/29/2017   Procedure: COLONOSCOPY;  Surgeon: Claudis RAYMOND Rivet, MD;  [facility];  Service: Endoscopy;  Laterality: N/A;  730   COLONOSCOPY WITH PROPOFOL  N/A 04/03/2022   Procedure: COLONOSCOPY WITH PROPOFOL ;  Surgeon: Rivet Claudis RAYMOND, MD;  Location: [facility];  Service: Endoscopy;  Laterality: N/A;  730   cyst removed from left wrist      EYE SURGERY     grafts N/A 10  years  ago   grafts to gums   HEMOSTASIS CLIP PLACEMENT  04/03/2022   Procedure: HEMOSTASIS CLIP PLACEMENT;  Surgeon: Rivet Claudis RAYMOND, MD;  Location: [facility];  Service:  Endoscopy;;   IR GASTROSTOMY TUBE MOD SED  12/15/2022   IR GASTROSTOMY TUBE REMOVAL  05/04/2023   IR IMAGING GUIDED PORT INSERTION  12/15/2022   IR NASO G TUBE PLC W/FL W/RAD  12/15/2022   IR REMOVAL TUN ACCESS W/ PORT W/O FL MOD SED  12/21/2023   OPEN REDUCTION INTERNAL FIXATION (ORIF) DISTAL RADIAL FRACTURE Right 12/28/2013    Procedure: OPEN REDUCTION INTERNAL FIXATION (ORIF) DISTAL RADIAL FRACTURE;  Surgeon: Taft FORBES Minerva, MD;  Location: [facility];  Service: Orthopedics;  Laterality: Right;   ORIF ULNAR FRACTURE Right 12/28/2013   Procedure: OPEN REDUCTION INTERNAL FIXATION (ORIF) ULNAR FRACTURE;  Surgeon: Taft FORBES Minerva, MD;  Location: [facility];  Service: Orthopedics;  Laterality: Right;   POLYPECTOMY  04/03/2022   Procedure: POLYPECTOMY;  Surgeon: Golda Claudis PENNER, MD;  [facility]: [facility];  Service: Endoscopy;;   right knee Right teenager   right knee     SOCIAL HISTORY: Social History   Socioeconomic History   Marital status: Divorced    Spouse name: Not on file   Number of children: Not on file   Years of education: Not on file   Highest education level: Not on file  Occupational History   Not on file  Tobacco Use   Smoking status: Never   Smokeless tobacco: Never  Vaping Use   Vaping status: Never Used  Substance and Sexual Activity   Alcohol use: Yes    Comment: Occ  beer or wine    Drug use: No   Sexual activity: Yes  Other Topics Concern   Not on file  Social History Narrative   Not on file   Social Drivers of Health   Financial Resource Strain: Low Risk  (12/03/2022)   Overall Financial Resource Strain (CARDIA)    Difficulty of Paying Living Expenses: Not hard at all  Food Insecurity: Low Risk  (03/20/2023)   Received from [facility]   Hunger Vital Sign    Within the past 12 months, you worried that your food would run out before you got money to buy more: Never true    Within the past 12 months, the food you bought just didn't last and you didn't have money to get more. : Never true  Transportation Needs: No Transportation Needs (03/20/2023)   Received from [employer]    In the past 12 months, has lack of reliable transportation kept you from medical appointments, meetings, work or from getting things needed for daily living? : No  Physical Activity:  Not on file  Stress: No Stress Concern Present (12/03/2022)   [employer] of Occupational Health - Occupational Stress Questionnaire    Feeling of Stress : Not at all  Social Connections: Not on file  Intimate Partner Violence: Not At Risk (12/03/2022)   Humiliation, Afraid, Rape, and Kick questionnaire    Fear of Current or Ex-Partner: No    Emotionally Abused: No    Physically Abused: No    Sexually Abused: No    FAMILY HISTORY: Family History  Problem Relation Age of Onset   Cancer Mother        small bowel spread to liver   Pancreatic cancer Father    Cancer Maternal Aunt    Brain cancer Maternal Uncle    Cancer Paternal Aunt    Breast cancer Cousin    Cancer Paternal Uncle     ALLERGIES:  has no known allergies.  MEDICATIONS:  Current Outpatient Medications  Medication Sig Dispense Refill   amLODipine (NORVASC) 10 MG tablet Take 10 mg by mouth daily.     atorvastatin  (LIPITOR) 40 MG tablet Take 40 mg by mouth daily. (Patient not taking: Reported on 03/18/2024)     lansoprazole (PREVACID) 15 MG capsule Take 15 mg by mouth daily at 12 noon.     lidocaine -prilocaine  (EMLA ) cream Apply to affected area once 30 g 3   olmesartan (BENICAR) 40 MG tablet Take 40 mg by mouth daily. (Patient not taking: Reported on 03/18/2024)     oxyCODONE  (ROXICODONE ) 5 MG immediate release tablet Take 1 tablet (5 mg total) by mouth every 6 (six) hours as needed for severe pain (pain score 7-10). 5 tablet 0   No current facility-administered medications for this visit.    REVIEW OF SYSTEMS:    10 Point review of Systems was done is negative except as noted above.   PHYSICAL EXAMINATION: .BP (!) 162/91   Pulse 60   Temp (!) 97.3 F (36.3 C)   Resp 20   Wt 219 lb 14.4 oz (99.7 kg)   SpO2 100%   BMI 28.23 kg/m  GENERAL:alert, in no acute distress and comfortable SKIN: no acute rashes, no significant lesions EYES: conjunctiva are pink and non-injected, sclera anicteric OROPHARYNX:  MMM, no exudates, no oropharyngeal erythema or ulceration NECK: supple, no JVD LYMPH:  no palpable lymphadenopathy in the cervical, axillary or inguinal regions LUNGS: clear to auscultation b/l with normal respiratory effort HEART: regular rate & rhythm ABDOMEN:  normoactive bowel sounds , non tender, not distended. Extremity: no pedal edema PSYCH: alert & oriented x 3 with fluent speech NEURO: no focal motor/sensory deficits   LABORATORY DATA:  I have reviewed the data as listed .SABRA    Latest Ref Rng & Units 05/16/2024   11:43 AM 11/17/2023    9:49 AM 07/21/2023   11:47 AM  CBC  WBC 4.0 - 10.5 K/uL 6.4  5.5  5.3   Hemoglobin 13.0 - 17.0 g/dL 85.5  86.2  86.1   Hematocrit 39.0 - 52.0 % 41.9  40.2  39.3   Platelets 150 - 400 K/uL 271  266  230    .    Latest Ref Rng & Units 05/16/2024   11:43 AM 11/17/2023    9:49 AM 07/21/2023   11:47 AM  CMP  Glucose 70 - 99 mg/dL 96  899  94   BUN 6 - 20 mg/dL 13  18  15    Creatinine 0.61 - 1.24 mg/dL 8.97  9.06  8.93   Sodium 135 - 145 mmol/L 139  139  138   Potassium 3.5 - 5.1 mmol/L 3.9  4.1  4.3   Chloride 98 - 111 mmol/L 105  106  107   CO2 22 - 32 mmol/L 30  30  28    Calcium  8.9 - 10.3 mg/dL 9.2  9.3  9.1   Total Protein 6.5 - 8.1 g/dL 7.0  7.2  7.0   Total Bilirubin 0.0 - 1.2 mg/dL 0.4  0.3  0.5   Alkaline Phos 38 - 126 U/L 65  65  68   AST 15 - 41 U/L 23  23  20    ALT 0 - 44 U/L 18  20  16     Magnesium  2   SURGICAL PATHOLOGY  CASE: [id number]  PATIENT: Jonathan Hudson  Surgical Pathology Report   Specimen Submitted:  A. Lymph node, left neck   Clinical History: Necrotic node CT, possible squamous  cell.  No history  of CA   DIAGNOSIS:  A. LYMPH NODE, LEFT NECK; ULTRASOUND-GUIDED BIOPSY:  - METASTATIC SQUAMOUS CELL CARCINOMA, P16 POSITIVE.  - NECROTIC DEBRIS.   Comment:  The carcinoma is positive for p63 and p16. This pattern of staining  supports the above diagnosis.  There is insufficient material for  ancillary molecular testing.   . Lab Results  Component Value Date   LDH 272 (H) 04/21/2023   Component     Latest Ref Rng 10/13/2022  HIV Screen 4th Generation wRfx     Non Reactive  Non Reactive   HCV Ab     NON REACTIVE  NON REACTIVE   Hepatitis B Surface Ag     NON REACTIVE  NON REACTIVE   Hep B S Ab     NON REACTIVE  NON REACTIVE   Hep B Core Total Ab     NON REACTIVE  NON REACTIVE   CRP     <1.0 mg/dL <9.4   Sed Rate     0 - 16 mm/hr 7   LDH     98 - 192 U/L 198 (H)     Legend: (H) High   RADIOGRAPHIC STUDIES: I have personally reviewed the radiological images as listed and agreed with the findings in the report.  No results found.   ASSESSMENT & PLAN  Jonathan Hudson is a 58 y.o. 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 Primary site unclear at this time but is likely oropharyngeal given HPV positivity. Underwent IMRT and weekly Cisplatin  #2  Radiation mucositis grade 2 #3 G-tube in place with mild serous/seropurulent discharge.  No induration or signs of infection. #4 mild protein calorie malnutrition -using tube feeding and hydration regularly at this time. #5 early herpes zoster along the cheek.  PLAN:  -Discussed lab results from today, 05/16/2024, in detail -11/22/2023 MRI abdomen shows 1. Intrinsically T2 hyperintense, homogeneously enhancing lesion of the inferior left lobe of the liver, hepatic segment III measuring 3.2 x 2.6 cm, an additional lesion of the adjacent hepatic segment IVA, measuring 1.5 x 1.2 cm. These are consistent with benign flash filling hemangiomata, requiring no specific further follow-up or characterization. 2. Numerous additional benign fluid signal cysts throughout the liver. 3. No evidence of lymphadenopathy or metastatic disease in the abdomen. - rt breast lesion excision showed Angiolipoma. -no lab or clinical evidence of progression/recurrence of Head and neck squamous cell  carcinoma. CT neck and chest in 5 months and MD visit 1-2 weeks after CT   FOLLOW-UP: CT neck and chest in 5 months and MD visit 1-2 weeks after CT   The total time spent in the appointment was 30 minutes* .  All of the patient's questions were answered with apparent satisfaction. The patient knows to call the clinic with any problems, questions or concerns.   Emaline Saran MD MS AAHIVMS [facility] Hematology/Oncology Physician [facility]  .*Total Encounter Time as defined by the Centers for Medicare and Medicaid Services includes, in addition to the face-to-face time of a patient visit (documented in the note above) non-face-to-face time: obtaining and reviewing outside history, ordering and reviewing medications, tests or procedures, care coordination (communications with other health care professionals or caregivers) and documentation in the medical record.    I,Mitra Faeizi,acting as a Neurosurgeon for Emaline Saran, MD.,have documented all relevant documentation on the behalf of Emaline Saran, MD,as directed by  Emaline Saran, MD while in  the presence of Emaline Saran, MD.  .I have reviewed the above documentation for accuracy and completeness, and I agree with the above. .Tashiya Souders Kishore Dotsie Gillette MD

## 2024-05-16 ENCOUNTER — Inpatient Hospital Stay: Payer: 59 | Attending: Hematology

## 2024-05-16 ENCOUNTER — Inpatient Hospital Stay (HOSPITAL_BASED_OUTPATIENT_CLINIC_OR_DEPARTMENT_OTHER): Payer: 59 | Admitting: Hematology

## 2024-05-16 VITALS — BP 162/91 | HR 60 | Temp 97.3°F | Resp 20 | Wt 219.9 lb

## 2024-05-16 DIAGNOSIS — R682 Dry mouth, unspecified: Secondary | ICD-10-CM | POA: Diagnosis not present

## 2024-05-16 DIAGNOSIS — E7800 Pure hypercholesterolemia, unspecified: Secondary | ICD-10-CM | POA: Insufficient documentation

## 2024-05-16 DIAGNOSIS — R42 Dizziness and giddiness: Secondary | ICD-10-CM | POA: Diagnosis not present

## 2024-05-16 DIAGNOSIS — E441 Mild protein-calorie malnutrition: Secondary | ICD-10-CM | POA: Diagnosis not present

## 2024-05-16 DIAGNOSIS — I10 Essential (primary) hypertension: Secondary | ICD-10-CM | POA: Insufficient documentation

## 2024-05-16 DIAGNOSIS — C090 Malignant neoplasm of tonsillar fossa: Secondary | ICD-10-CM

## 2024-05-16 DIAGNOSIS — Z931 Gastrostomy status: Secondary | ICD-10-CM | POA: Diagnosis not present

## 2024-05-16 DIAGNOSIS — K1233 Oral mucositis (ulcerative) due to radiation: Secondary | ICD-10-CM | POA: Diagnosis not present

## 2024-05-16 LAB — CBC WITH DIFFERENTIAL (CANCER CENTER ONLY)
Abs Immature Granulocytes: 0.01 K/uL (ref 0.00–0.07)
Basophils Absolute: 0 K/uL (ref 0.0–0.1)
Basophils Relative: 1 %
Eosinophils Absolute: 0.2 K/uL (ref 0.0–0.5)
Eosinophils Relative: 3 %
HCT: 41.9 % (ref 39.0–52.0)
Hemoglobin: 14.4 g/dL (ref 13.0–17.0)
Immature Granulocytes: 0 %
Lymphocytes Relative: 27 %
Lymphs Abs: 1.7 K/uL (ref 0.7–4.0)
MCH: 31 pg (ref 26.0–34.0)
MCHC: 34.4 g/dL (ref 30.0–36.0)
MCV: 90.3 fL (ref 80.0–100.0)
Monocytes Absolute: 0.5 K/uL (ref 0.1–1.0)
Monocytes Relative: 8 %
Neutro Abs: 3.8 K/uL (ref 1.7–7.7)
Neutrophils Relative %: 61 %
Platelet Count: 271 K/uL (ref 150–400)
RBC: 4.64 MIL/uL (ref 4.22–5.81)
RDW: 14.3 % (ref 11.5–15.5)
WBC Count: 6.4 K/uL (ref 4.0–10.5)
nRBC: 0 % (ref 0.0–0.2)

## 2024-05-16 LAB — CMP (CANCER CENTER ONLY)
ALT: 18 U/L (ref 0–44)
AST: 23 U/L (ref 15–41)
Albumin: 4 g/dL (ref 3.5–5.0)
Alkaline Phosphatase: 65 U/L (ref 38–126)
Anion gap: 4 — ABNORMAL LOW (ref 5–15)
BUN: 13 mg/dL (ref 6–20)
CO2: 30 mmol/L (ref 22–32)
Calcium: 9.2 mg/dL (ref 8.9–10.3)
Chloride: 105 mmol/L (ref 98–111)
Creatinine: 1.02 mg/dL (ref 0.61–1.24)
GFR, Estimated: >60 mL/min (ref >60)
Glucose, Bld: 96 mg/dL (ref 70–99)
Potassium: 3.9 mmol/L (ref 3.5–5.1)
Sodium: 139 mmol/L (ref 135–145)
Total Bilirubin: 0.4 mg/dL (ref 0.0–1.2)
Total Protein: 7 g/dL (ref 6.5–8.1)

## 2024-05-18 ENCOUNTER — Other Ambulatory Visit: Payer: Self-pay

## 2024-05-20 ENCOUNTER — Other Ambulatory Visit: Payer: Self-pay

## 2024-05-23 ENCOUNTER — Encounter: Payer: Self-pay | Admitting: Hematology

## 2024-07-20 ENCOUNTER — Other Ambulatory Visit (HOSPITAL_COMMUNITY): Payer: Self-pay | Admitting: Internal Medicine

## 2024-07-20 DIAGNOSIS — E782 Mixed hyperlipidemia: Secondary | ICD-10-CM

## 2024-08-01 ENCOUNTER — Ambulatory Visit (HOSPITAL_COMMUNITY)
Admission: RE | Admit: 2024-08-01 | Discharge: 2024-08-01 | Disposition: A | Discharge status: Home / Self Care | Payer: Self-pay | Source: Ambulatory Visit | Attending: Internal Medicine | Admitting: Internal Medicine

## 2024-08-01 DIAGNOSIS — E782 Mixed hyperlipidemia: Secondary | ICD-10-CM | POA: Insufficient documentation

## 2024-09-11 ENCOUNTER — Other Ambulatory Visit: Payer: Self-pay

## 2024-09-18 ENCOUNTER — Other Ambulatory Visit: Payer: Self-pay

## 2024-09-21 ENCOUNTER — Telehealth: Payer: Self-pay | Admitting: *Deleted

## 2024-09-21 NOTE — Telephone Encounter (Signed)
 09/16/2024 Late entry:  Jonathan Hudson for initial FMLA recently received completed and to provider for review and signature.   Paperwork received signed by provider, Returned to [employer] via fax [phone]). Copy to [facility] for items to be scanned.  No record request received at this time.  Form prepared to mail to patient's address on file. [address] [place] [address] Form process completed with no further instructions received, actions performed or required by this nurse.

## 2024-09-21 NOTE — Telephone Encounter (Signed)
 09/19/2024 Jonathan Hudson to see this nurse.  Now they ([employer]) tell me my FMLA is expired.  I wish this could be done annually and not every 54-months. Completed [id number] to provider for review and signature returned 09/20/2024.  Successfully faxed to employer HR; [phone].  No records requested at this time.  Prepared form for [facility] bin for items to be scanned. And to be mailed to address on file, [address] [name] [address]  Form process completed with no further instructions received, actions performed or required by this nurse.

## 2024-09-22 ENCOUNTER — Other Ambulatory Visit: Payer: Self-pay

## 2024-10-12 ENCOUNTER — Other Ambulatory Visit: Payer: Self-pay

## 2024-10-12 NOTE — Progress Notes (Incomplete)
 Pain issues, if any: *** Using a feeding tube?: *** Weight changes, if any: *** Swallowing issues, if any: *** Smoking or chewing tobacco? *** Using fluoride toothpaste daily? *** Last ENT visit was on: *** Other notable issues, if any: ***

## 2024-10-20 ENCOUNTER — Other Ambulatory Visit: Payer: Self-pay

## 2024-10-20 ENCOUNTER — Telehealth: Payer: Self-pay | Admitting: *Deleted

## 2024-10-20 DIAGNOSIS — C090 Malignant neoplasm of tonsillar fossa: Secondary | ICD-10-CM

## 2024-10-20 NOTE — Telephone Encounter (Signed)
 CALLED PATIENT TO ASK QUESTION, LVM FOR A RETURN CALL

## 2024-10-20 NOTE — Telephone Encounter (Signed)
 RETURNED PATIENT'S PHONE CALL, LVM FOR A RETURN CALL

## 2024-10-21 ENCOUNTER — Ambulatory Visit: Payer: Self-pay | Admitting: Radiation Oncology

## 2024-10-24 ENCOUNTER — Encounter: Payer: Self-pay | Admitting: Hematology

## 2024-10-24 ENCOUNTER — Telehealth: Payer: Self-pay | Admitting: *Deleted

## 2024-10-24 NOTE — Telephone Encounter (Signed)
 CALLED PATIENT TO INFORM OF CT FOR 10-31-24- ARRIVAL TIME- 7:15 AM @ [facility], PATIENT TO HAVE LIQUIDS ONLY - 4 RHS. PRIOR TO SCAN, PATIENT TO RECEIVE RESULTS FROM DR. SQUIRE 11-08-24 @ 2:10 PM, LVM FOR A RETURN CALL

## 2024-10-26 ENCOUNTER — Encounter: Payer: Self-pay | Admitting: Hematology

## 2024-10-31 ENCOUNTER — Other Ambulatory Visit

## 2024-10-31 ENCOUNTER — Ambulatory Visit (HOSPITAL_COMMUNITY)
Admission: RE | Admit: 2024-10-31 | Discharge: 2024-10-31 | Disposition: A | Discharge status: Home / Self Care | Source: Ambulatory Visit | Attending: Radiation Oncology | Admitting: Radiation Oncology

## 2024-10-31 ENCOUNTER — Ambulatory Visit: Admitting: Hematology

## 2024-10-31 DIAGNOSIS — C090 Malignant neoplasm of tonsillar fossa: Secondary | ICD-10-CM | POA: Insufficient documentation

## 2024-10-31 MED ORDER — IOHEXOL 300 MG/ML  SOLN
75.0000 mL | Freq: Once | INTRAMUSCULAR | Status: AC | PRN
  Administered 2024-10-31: 75 mL via INTRAVENOUS

## 2024-10-31 NOTE — Progress Notes (Signed)
 Jonathan Hudson presents today for follow up for completion of radiation therapy. 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  Pain issues, if any: Denies Using a feeding tube?: Removed Weight changes, if jwb:Dujaoz  Swallowing issues, if any: No issues swallowing foods or fluids.  Smoking or chewing tobacco? Denies Using fluoride toothpaste daily? Yes Last ENT visit was on: 08/11/2024 with Dr. Ida Loader Other notable issues, if any: Denies    BP (!) 150/94 (BP Location: Right Arm, Patient Position: Sitting)   Pulse 69   Temp (!) 97.2 F (36.2 C)   Resp 17   Wt 231 lb 9.6 oz (105.1 kg)   SpO2 100%   BMI 29.74 kg/m

## 2024-11-07 ENCOUNTER — Inpatient Hospital Stay

## 2024-11-07 ENCOUNTER — Inpatient Hospital Stay: Admitting: Hematology

## 2024-11-08 ENCOUNTER — Encounter: Payer: Self-pay | Admitting: Radiation Oncology

## 2024-11-08 ENCOUNTER — Encounter: Payer: Self-pay | Admitting: Hematology

## 2024-11-08 ENCOUNTER — Ambulatory Visit
Admission: RE | Admit: 2024-11-08 | Discharge: 2024-11-08 | Disposition: A | Discharge status: Home / Self Care | Source: Ambulatory Visit | Attending: Radiation Oncology | Admitting: Radiation Oncology

## 2024-11-08 ENCOUNTER — Other Ambulatory Visit: Payer: Self-pay

## 2024-11-08 VITALS — BP 150/94 | HR 69 | Temp 97.2°F | Resp 17 | Wt 231.6 lb

## 2024-11-08 DIAGNOSIS — Z923 Personal history of irradiation: Secondary | ICD-10-CM | POA: Insufficient documentation

## 2024-11-08 DIAGNOSIS — Z79899 Other long term (current) drug therapy: Secondary | ICD-10-CM | POA: Diagnosis not present

## 2024-11-08 DIAGNOSIS — C090 Malignant neoplasm of tonsillar fossa: Secondary | ICD-10-CM

## 2024-11-08 DIAGNOSIS — Z85818 Personal history of malignant neoplasm of other sites of lip, oral cavity, and pharynx: Secondary | ICD-10-CM | POA: Diagnosis present

## 2024-11-08 NOTE — Progress Notes (Signed)
 " Radiation Oncology         [phone] ________________________________  Name: Jonathan Hudson MRN: [id number]  Date: 11/08/2024  DOB: [birth year]  Follow-Up Visit Note  CC: Jonathan Norleen PEDLAR, MD  Jonathan Norleen PEDLAR, MD  Diagnosis and Prior Radiotherapy:       ICD-10-CM   1. 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         Cancer Staging  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HCC) Staging form: Pharynx - HPV-Mediated Oropharynx, AJCC 8th Edition - Clinical stage from 11/18/2022: Stage I (cT1, cN1, cM0) - Unsigned Stage prefix: Initial diagnosis   First Treatment Date: 2022-12-08 - Last Treatment Date: 2023-01-23   Plan Name: HN_L_Tonsil Site: Tonsil, Left Technique: IMRT Mode: Photon Dose Per Fraction: 2 Gy Prescribed Dose (Delivered / Prescribed): 70 Gy / 70 Gy Prescribed Fxs (Delivered / Prescribed): 35 / 35     CHIEF COMPLAINT:  Here for follow-up and surveillance of tonsil cancer  Narrative:  Jonathan Hudson presents today for follow up for completion of radiation therapy. He completed treatment of tonsillar cancer on 01/23/23.   Pain issues, if any: Denies Using a feeding tube?: Removed Weight changes, if jwb:Dujaoz  Swallowing issues, if any: No issues swallowing foods or fluids.  Smoking or chewing tobacco? Denies Using fluoride toothpaste daily? Yes Last ENT visit was on: 08/11/2024 with Dr. Ida Loader Other notable issues, if any: Denies    BP (!) 150/94 (BP Location: Right Arm, Patient Position: Sitting)   Pulse 69   Temp (!) 97.2 F (36.2 C)   Resp 17   Wt 231 lb 9.6 oz (105.1 kg)   SpO2 100%   BMI 29.74 kg/m    ALLERGIES:  has no known allergies.  Meds: Current Outpatient Medications  Medication Sig Dispense Refill   amLODipine (NORVASC) 10 MG tablet Take 10 mg by mouth daily.     atorvastatin  (LIPITOR) 40 MG tablet Take 40 mg by mouth daily. (Patient not taking: Reported on 03/18/2024)     lansoprazole (PREVACID) 15 MG capsule Take 15 mg by mouth daily at 12  noon.     lidocaine -prilocaine  (EMLA ) cream Apply to affected area once 30 g 3   olmesartan (BENICAR) 40 MG tablet Take 40 mg by mouth daily. (Patient not taking: Reported on 03/18/2024)     oxyCODONE  (ROXICODONE ) 5 MG immediate release tablet Take 1 tablet (5 mg total) by mouth every 6 (six) hours as needed for severe pain (pain score 7-10). 5 tablet 0   No current facility-administered medications for this encounter.    Physical Findings:  The patient is in no acute distress. Patient is alert and oriented. Wt Readings from Last 3 Encounters:  11/08/24 231 lb 9.6 oz (105.1 kg)  05/16/24 219 lb 14.4 oz (99.7 kg)  03/18/24 219 lb (99.3 kg)    weight is 231 lb 9.6 oz (105.1 kg). His temperature is 97.2 F (36.2 C) (abnormal). His blood pressure is 150/94 (abnormal) and his pulse is 69. His respiration is 17 and oxygen saturation is 100%. .  General: Alert and oriented, in no acute distress HEENT: Head is normocephalic. Extraocular movements are intact. Oropharynx is clear- no thrush or lesions. Mouth is clear.  Neck: Neck is notable for no masses Skin: Skin in treatment fields shows satisfactory healing over his neck.   Abdomen: PEG tube removed - no tenderness Chest: CTAB Heart RRR Extremities: No cyanosis or edema. Lymphatics: see Neck Exam Psychiatric: Judgment and insight are intact. Affect  is appropriate.   Lab Findings: Lab Results  Component Value Date   WBC 6.4 05/16/2024   HGB 14.4 05/16/2024   HCT 41.9 05/16/2024   MCV 90.3 05/16/2024   PLT 271 05/16/2024    Lab Results  Component Value Date   TSH 1.877 02/16/2024    Radiographic Findings: CT Chest W Contrast Result Date: 11/04/2024 EXAM: CT CHEST WITH CONTRAST 10/31/2024 08:09:52 AM TECHNIQUE: CT of the chest was performed with the administration of 75 mL iohexol  (OMNIPAQUE ) 300 MG/ML solution. Multiplanar reformatted images are provided for review. Automated exposure control, iterative reconstruction, and/or  weight based adjustment of the mA/kV was utilized to reduce the radiation dose to as low as reasonably achievable. COMPARISON: 11/03/2023 chest CT. 08/01/2024 coronary CT. CLINICAL HISTORY: Head/neck cancer, monitor. * Tracking Code: BO * FINDINGS: MEDIASTINUM: Left anterior descending coronary atherosclerosis. Heart and pericardium are unremarkable. The central airways are clear. LYMPH NODES: No mediastinal, hilar or axillary lymphadenopathy. LUNGS AND PLEURA: No acute consolidative airspace disease. No lung masses. No significant pulmonary nodules. No pleural effusion or pneumothorax. SOFT TISSUES/BONES: Minimal thoracic spondylosis. No acute abnormality of the soft tissues. UPPER ABDOMEN: Several benign liver cysts scattered throughout the visualized liver, largest 3.3 cm in the left liver. Numerous subcentimeter hypodense liver lesions are too small to characterize and not appreciably changed, presumably benign. Avidly enhancing 3.0 cm segment 3 left liver mass on image 172 is stable using similar measurement technique and similar stable hyperenhancing 1.2 cm segment 4 left liver mass on image 171. These liver masses were characterized as benign hemangiomas on 11/22/2023 MRI abdomen. IMPRESSION: 1. No evidence of metastatic disease in the chest. 2. One vessel coronary atherosclerosis. Electronically signed by: Selinda Blue MD 11/04/2024 01:27 PM EST RP Workstation: [id number]   CT Soft Tissue Neck W Contrast Result Date: 11/04/2024 EXAM: CT NECK WITH CONTRAST 10/31/2024 08:09:52 AM TECHNIQUE: CT of the neck was performed with the administration of intravenous contrast. Multiplanar reformatted images are provided for review. Automated exposure control, iterative reconstruction, and/or weight based adjustment of the mA/kV was utilized to reduce the radiation dose to as low as reasonably achievable. CONTRAST: 75 mL of Omnipaque  300. COMPARISON: CT Neck with IV Contrast 11/03/2023. CLINICAL HISTORY: Head/neck  cancer, monitor. Carcinoma of the tonsillar fossa. FINDINGS: AERODIGESTIVE TRACT: No discrete mass. No edema. No residual or recurrent tumor is present in the oropharynx. SALIVARY GLANDS: The parotid and submandibular glands are unremarkable. THYROID : Unremarkable. LYMPH NODES: A low-density collection felt to represent lymph node on the prior exam has significantly decreased in size, now measuring 8 x 10 x 15 mm compared with 18 x 24 x 24 mm previously. No new or enlarging lesions are present. No other significant adenopathy is present. This area demonstrated some uptake on the initial post treatment PET scan. SOFT TISSUES: No mass or fluid collection. BONES: No abnormality. OTHER: Visualized sinuses and mastoid air cells are well aerated. Visualized lungs are clear. IMPRESSION: 1. No residual or recurrent tumor in the oropharynx. 2. Significant interval decrease in size of the previously described low-density left neck collection, likely a lymph node, with no new or enlarging lesions or other significant adenopathy. Ni-RADs 2. PET scan maybe useful to assure resolution of previous activity in this area. Electronically signed by: Lonni Necessary MD 11/04/2024 01:19 PM EST RP Workstation: [id number]    Impression/Plan:    1) Head and Neck Cancer Status:   no evidence of disease recurrence on clinical exam today nor on imaging.  Given his  serial scans I consider him cured and advise imaging in the future only as clinically indicated.   2) Nutritional Status: Patient is eating well at this point.  Wt Readings from Last 3 Encounters:  11/08/24 231 lb 9.6 oz (105.1 kg)  05/16/24 219 lb 14.4 oz (99.7 kg)  03/18/24 219 lb (99.3 kg)   PEG tube: removed   3) Risk Factors: The patient has been educated about risk factors including alcohol and tobacco abuse; they understand that avoidance of alcohol and tobacco is important to prevent recurrences as well as other cancers. He is no longer smoking tobacco.  He drinks very occasionally.   4) Swallowing: Patient is swallowing well - know to continue with SLP exercises   5) Dental: Encouraged to continue regular followup with dentistry, flossing, and dental hygiene including fluoride rinses or fluoride toothpaste. He is following with his dentist closely.   6) Thyroid  function: Checking annually. Recommend annual follow up, he stated he will have his PCP monitor this annually to avoid multiple sticks.   Lab Results  Component Value Date   TSH 1.877 02/16/2024     7) Recommend enrollment in our survivorship H+N service in 70mo - referral placed. I will see him PRN. Continue following with med onc and ENT as long as they recommend.  He is very grateful for how he is doing and for the care he received.  On date of service, in total, I spent 30 minutes on this encounter. Patient was seen in person. -----------------------------------  Lauraine Golden, MD    "

## 2024-11-10 ENCOUNTER — Other Ambulatory Visit: Payer: Self-pay

## 2024-11-10 DIAGNOSIS — C090 Malignant neoplasm of tonsillar fossa: Secondary | ICD-10-CM

## 2024-11-11 ENCOUNTER — Inpatient Hospital Stay: Attending: Hematology

## 2024-11-11 ENCOUNTER — Inpatient Hospital Stay: Admitting: Hematology

## 2024-11-11 VITALS — BP 154/85 | HR 63 | Temp 98.0°F | Resp 17 | Ht 74.0 in | Wt 229.5 lb

## 2024-11-11 DIAGNOSIS — Z931 Gastrostomy status: Secondary | ICD-10-CM | POA: Insufficient documentation

## 2024-11-11 DIAGNOSIS — C090 Malignant neoplasm of tonsillar fossa: Secondary | ICD-10-CM | POA: Diagnosis not present

## 2024-11-11 DIAGNOSIS — I10 Essential (primary) hypertension: Secondary | ICD-10-CM | POA: Diagnosis not present

## 2024-11-11 DIAGNOSIS — E7800 Pure hypercholesterolemia, unspecified: Secondary | ICD-10-CM | POA: Diagnosis not present

## 2024-11-11 LAB — CBC WITH DIFFERENTIAL (CANCER CENTER ONLY)
Abs Immature Granulocytes: 0.01 K/uL (ref 0.00–0.07)
Basophils Absolute: 0 K/uL (ref 0.0–0.1)
Basophils Relative: 1 %
Eosinophils Absolute: 0.2 K/uL (ref 0.0–0.5)
Eosinophils Relative: 3 %
HCT: 39.9 % (ref 39.0–52.0)
Hemoglobin: 14.1 g/dL (ref 13.0–17.0)
Immature Granulocytes: 0 %
Lymphocytes Relative: 32 %
Lymphs Abs: 2.4 K/uL (ref 0.7–4.0)
MCH: 32.2 pg (ref 26.0–34.0)
MCHC: 35.3 g/dL (ref 30.0–36.0)
MCV: 91.1 fL (ref 80.0–100.0)
Monocytes Absolute: 0.7 K/uL (ref 0.1–1.0)
Monocytes Relative: 9 %
Neutro Abs: 4.1 K/uL (ref 1.7–7.7)
Neutrophils Relative %: 55 %
Platelet Count: 224 K/uL (ref 150–400)
RBC: 4.38 MIL/uL (ref 4.22–5.81)
RDW: 13.8 % (ref 11.5–15.5)
WBC Count: 7.5 K/uL (ref 4.0–10.5)
nRBC: 0 % (ref 0.0–0.2)

## 2024-11-11 LAB — CMP (CANCER CENTER ONLY)
ALT: 24 U/L (ref 0–44)
AST: 33 U/L (ref 15–41)
Albumin: 4.1 g/dL (ref 3.5–5.0)
Alkaline Phosphatase: 74 U/L (ref 38–126)
Anion gap: 9 (ref 5–15)
BUN: 17 mg/dL (ref 6–20)
CO2: 26 mmol/L (ref 22–32)
Calcium: 8.9 mg/dL (ref 8.9–10.3)
Chloride: 106 mmol/L (ref 98–111)
Creatinine: 1.14 mg/dL (ref 0.61–1.24)
GFR, Estimated: >60 mL/min
Glucose, Bld: 98 mg/dL (ref 70–99)
Potassium: 4.1 mmol/L (ref 3.5–5.1)
Sodium: 141 mmol/L (ref 135–145)
Total Bilirubin: 0.4 mg/dL (ref 0.0–1.2)
Total Protein: 7 g/dL (ref 6.5–8.1)

## 2024-11-11 NOTE — Progress Notes (Signed)
 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  HISTORY OF PRESENTING ILLNESS: Jonathan Hudson 59 y.o. male with medical history significant for hypertension and hypercholesteremia presents to the diagnostic clinic for evaluation for left neck lymphadenopathy.   On review of the previous records, Jonathan Hudson presented to the urgent care on 09/04/2022 with a large painful left neck mass. It was initially felt to be infectious so treated with doxycycline  with little improvement. He followed by with his PCP on 09/22/2022 who ordered a ultrasound on 10/01/2022 which revealed a hypoechoic solid mass in the left neck measuring 4.7 x 2.7 x 4.2 cm. CT neck followed on 10/06/2022 which showed bulky and necrotic adenopathy in the left level 2 neck.    On exam today, Jonathan Hudson reports that his energy levels are fairly stable. He is able to complete all his ADLs on his own. He is currently on FMLA while he undergoes workup. He reports having a good appetite and denies any weight loss. He denies nausea, vomiting or abdominal pain. His bowel habits are unchanged without any recurrent episodes of diarrhea or constipation. He denies easy bruising or signs of active bleeding. He reports having intermittent episodes of positional dizziness without any syncopal episodes.He denies fevers, chills, night sweats, shortness of breath, chest pain, cough, peripheral edema, neuropathy, pruritus or rash. He has no other complaints. Rest of the 10 point ROS is  below.   SUMMARY OF ONCOLOGIC HISTORY: Oncology History  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HCC)  11/18/2022 Initial Diagnosis   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HCC)   12/10/2022 -  Chemotherapy   Patient is on Treatment Plan : HEAD/NECK Cisplatin  (40) q7d       INTERVAL HISTORY: SHAHIEM Hudson is a 59 y.o. male who is here today for continued evaluation and management of squamous cell head and neck cancer.   he was last seen by me on 05/16/2024; at the time he did not have any concerns and was doing well.   Today, he reports doing well with no acute concerns.   He reports coming off of his cholesterol medication two months ago.    REVIEW OF SYSTEMS:   10 Point review of systems of done and is negative except as noted above.  MEDICAL HISTORY Past Medical History:  Diagnosis Date   Boils 01/01/2017   GERD (gastroesophageal reflux disease)    Hypercholesteremia    Hypertension    Tonsillar cancer (HCC)     SURGICAL HISTORY Past Surgical History:  Procedure Laterality Date   BREAST CYST EXCISION Right 03/25/2024   Procedure: EXCISION, CYST, BREAST;  Surgeon: Curvin Deward MOULD, MD;  Location:  [facility];  Service: General;  Laterality: Right;  EXCISION LIPOMA RIGHT BREAST   COLONOSCOPY N/A 01/29/2017   Procedure: COLONOSCOPY;  Surgeon: Claudis RAYMOND Rivet, MD;  Location: [facility];  Service: Endoscopy;  Laterality: N/A;  730   COLONOSCOPY WITH PROPOFOL  N/A 04/03/2022   Procedure: COLONOSCOPY WITH PROPOFOL ;  Surgeon: Rivet Claudis RAYMOND, MD;  Location: [facility];  Service: Endoscopy;  Laterality: N/A;  730   cyst removed from left  wrist      EYE SURGERY     grafts N/A 10  years  ago   grafts to gums   HEMOSTASIS CLIP PLACEMENT  04/03/2022   Procedure: HEMOSTASIS CLIP PLACEMENT;  Surgeon: Golda Claudis PENNER, MD;  Location: [facility];  Service: Endoscopy;;   IR GASTROSTOMY TUBE MOD SED  12/15/2022   IR GASTROSTOMY TUBE REMOVAL  05/04/2023   IR IMAGING GUIDED PORT  INSERTION  12/15/2022   IR NASO G TUBE PLC W/FL W/RAD  12/15/2022   IR REMOVAL TUN ACCESS W/ PORT W/O FL MOD SED  12/21/2023   OPEN REDUCTION INTERNAL FIXATION (ORIF) DISTAL RADIAL FRACTURE Right 12/28/2013   Procedure: OPEN REDUCTION INTERNAL FIXATION (ORIF) DISTAL RADIAL FRACTURE;  Surgeon: Taft FORBES Minerva, MD;  Location: [facility];  Service: Orthopedics;  Laterality: Right;   ORIF ULNAR FRACTURE Right 12/28/2013   Procedure: OPEN REDUCTION INTERNAL FIXATION (ORIF) ULNAR FRACTURE;  Surgeon: Taft FORBES Minerva, MD;  Location: [facility];  Service: Orthopedics;  Laterality: Right;   POLYPECTOMY  04/03/2022   Procedure: POLYPECTOMY;  Surgeon: Golda Claudis PENNER, MD;  [facility];  Service: Endoscopy;;   right knee Right teenager   right knee     SOCIAL HISTORY Social History[1]  Social History   Social History Narrative   Not on file    SOCIAL DRIVERS OF HEALTH SDOH Screenings   Food Insecurity: Low Risk (03/20/2023)   Received from [facility]  Housing: Unknown (03/17/2024)   Received from [facility]  Transportation Needs: No Transportation Needs (03/20/2023)   Received from [facility]  Utilities: Low Risk (03/20/2023)   Received from [facility]  Financial Resource Strain: Low Risk (12/03/2022)  Stress: No Stress Concern Present (12/03/2022)  Tobacco Use: Low Risk (08/11/2024)   Received from [facility]     FAMILY HISTORY Family History  Problem Relation Age of Onset   Cancer Mother        small bowel spread to liver   Pancreatic cancer Father    Cancer Maternal Aunt    Brain cancer Maternal Uncle    Cancer Paternal Aunt    Breast cancer Cousin    Cancer Paternal Uncle      ALLERGIES: has no known allergies.  MEDICATIONS  Current Outpatient Medications  Medication Sig Dispense Refill   amLODipine (NORVASC) 10 MG tablet Take 10 mg by mouth daily.     atorvastatin  (LIPITOR) 40 MG tablet Take 40 mg by mouth daily. (Patient not taking:  Reported on 03/18/2024)     lansoprazole (PREVACID) 15 MG capsule Take 15 mg by mouth daily at 12 noon.     lidocaine -prilocaine  (EMLA ) cream Apply to affected area once 30 g 3   olmesartan (BENICAR) 40 MG tablet Take 40 mg by mouth daily. (Patient not taking: Reported on 03/18/2024)     oxyCODONE  (ROXICODONE ) 5 MG immediate release tablet Take 1 tablet (5 mg total) by mouth every 6 (six) hours as needed for severe pain (pain score 7-10). 5 tablet 0   No current facility-administered medications for this visit.    PHYSICAL EXAMINATION: ECOG PERFORMANCE STATUS: 1 - Symptomatic but completely ambulatory VITALS: Vitals:   11/11/24 1441  BP: (!) 154/85  Pulse: 63  Resp: 17  Temp: 98 F (36.7 C)  SpO2: 98%   Filed Weights   11/11/24 1441  Weight: 229 lb 8 oz (104.1 kg)   Body mass index is 29.47 kg/m.  GENERAL: alert, in no acute distress  and comfortable SKIN: no acute rashes, no significant lesions EYES: conjunctiva are pink and non-injected, sclera anicteric OROPHARYNX: MMM, no exudates, no oropharyngeal erythema or ulceration NECK: supple, no JVD LYMPH:  no palpable lymphadenopathy in the cervical, axillary or inguinal regions LUNGS: clear to auscultation b/l with normal respiratory effort HEART: regular rate & rhythm ABDOMEN:  normoactive bowel sounds , non tender, not distended, no hepatosplenomegaly Extremity: no pedal edema PSYCH: alert & oriented x 3 with fluent speech NEURO: no focal motor/sensory deficits  LABORATORY DATA:   I have reviewed the data as listed     Latest Ref Rng & Units 11/11/2024    2:27 PM 05/16/2024   11:43 AM 11/17/2023    9:49 AM  CBC EXTENDED  WBC 4.0 - 10.5 K/uL 7.5  6.4  5.5   RBC 4.22 - 5.81 MIL/uL 4.38  4.64  4.46   Hemoglobin 13.0 - 17.0 g/dL 85.8  85.5  86.2   HCT 39.0 - 52.0 % 39.9  41.9  40.2   Platelets 150 - 400 K/uL 224  271  266   NEUT# 1.7 - 7.7 K/uL 4.1  3.8  3.1   Lymph# 0.7 - 4.0 K/uL 2.4  1.7  1.8        Latest Ref Rng  & Units 11/11/2024    2:27 PM 05/16/2024   11:43 AM 11/17/2023    9:49 AM  CMP  Glucose 70 - 99 mg/dL 98  96  899   BUN 6 - 20 mg/dL 17  13  18    Creatinine 0.61 - 1.24 mg/dL 8.85  8.97  9.06   Sodium 135 - 145 mmol/L 141  139  139   Potassium 3.5 - 5.1 mmol/L 4.1  3.9  4.1   Chloride 98 - 111 mmol/L 106  105  106   CO2 22 - 32 mmol/L 26  30  30    Calcium  8.9 - 10.3 mg/dL 8.9  9.2  9.3   Total Protein 6.5 - 8.1 g/dL 7.0  7.0  7.2   Total Bilirubin 0.0 - 1.2 mg/dL 0.4  0.4  0.3   Alkaline Phos 38 - 126 U/L 74  65  65   AST 15 - 41 U/L 33  23  23   ALT 0 - 44 U/L 24  18  20     Magnesium  2     SURGICAL PATHOLOGY  CASE: [id number]  PATIENT: RONALEE SLICKER  Surgical Pathology Report   Specimen Submitted:  A. Lymph node, left neck   Clinical History: Necrotic node CT, possible squamous cell.  No history  of CA   DIAGNOSIS:  A. LYMPH NODE, LEFT NECK; ULTRASOUND-GUIDED BIOPSY:  - METASTATIC SQUAMOUS CELL CARCINOMA, P16 POSITIVE.  - NECROTIC DEBRIS.   Comment:  The carcinoma is positive for p63 and p16. This pattern of staining  supports the above diagnosis.  There is insufficient material for ancillary molecular testing.    SABRA Recent Labs       Lab Results  Component Value Date    LDH 272 (H) 04/21/2023      Component     Latest Ref Rng 10/13/2022  HIV Screen 4th Generation wRfx     Non Reactive  Non Reactive   HCV Ab     NON REACTIVE  NON REACTIVE   Hepatitis B Surface Ag     NON REACTIVE  NON REACTIVE   Hep B S Ab     NON REACTIVE  NON REACTIVE  Hep B Core Total Ab     NON REACTIVE  NON REACTIVE   CRP     <1.0 mg/dL <9.4   Sed Rate     0 - 16 mm/hr 7   LDH     98 - 192 U/L 198 (H)     Legend: (H) High  RADIOGRAPHIC STUDIES: I have personally reviewed the radiological images as listed and agreed with the findings in the report. CT Chest W Contrast Result Date: 11/04/2024 EXAM: CT CHEST WITH CONTRAST 10/31/2024 08:09:52 AM TECHNIQUE: CT of the chest  was performed with the administration of 75 mL iohexol  (OMNIPAQUE ) 300 MG/ML solution. Multiplanar reformatted images are provided for review. Automated exposure control, iterative reconstruction, and/or weight based adjustment of the mA/kV was utilized to reduce the radiation dose to as low as reasonably achievable. COMPARISON: 11/03/2023 chest CT. 08/01/2024 coronary CT. CLINICAL HISTORY: Head/neck cancer, monitor. * Tracking Code: BO * FINDINGS: MEDIASTINUM: Left anterior descending coronary atherosclerosis. Heart and pericardium are unremarkable. The central airways are clear. LYMPH NODES: No mediastinal, hilar or axillary lymphadenopathy. LUNGS AND PLEURA: No acute consolidative airspace disease. No lung masses. No significant pulmonary nodules. No pleural effusion or pneumothorax. SOFT TISSUES/BONES: Minimal thoracic spondylosis. No acute abnormality of the soft tissues. UPPER ABDOMEN: Several benign liver cysts scattered throughout the visualized liver, largest 3.3 cm in the left liver. Numerous subcentimeter hypodense liver lesions are too small to characterize and not appreciably changed, presumably benign. Avidly enhancing 3.0 cm segment 3 left liver mass on image 172 is stable using similar measurement technique and similar stable hyperenhancing 1.2 cm segment 4 left liver mass on image 171. These liver masses were characterized as benign hemangiomas on 11/22/2023 MRI abdomen. IMPRESSION: 1. No evidence of metastatic disease in the chest. 2. One vessel coronary atherosclerosis. Electronically signed by: Selinda Blue MD 11/04/2024 01:27 PM EST RP Workstation: [id number]   CT Soft Tissue Neck W Contrast Result Date: 11/04/2024 EXAM: CT NECK WITH CONTRAST 10/31/2024 08:09:52 AM TECHNIQUE: CT of the neck was performed with the administration of intravenous contrast. Multiplanar reformatted images are provided for review. Automated exposure control, iterative reconstruction, and/or weight based adjustment of  the mA/kV was utilized to reduce the radiation dose to as low as reasonably achievable. CONTRAST: 75 mL of Omnipaque  300. COMPARISON: CT Neck with IV Contrast 11/03/2023. CLINICAL HISTORY: Head/neck cancer, monitor. Carcinoma of the tonsillar fossa. FINDINGS: AERODIGESTIVE TRACT: No discrete mass. No edema. No residual or recurrent tumor is present in the oropharynx. SALIVARY GLANDS: The parotid and submandibular glands are unremarkable. THYROID : Unremarkable. LYMPH NODES: A low-density collection felt to represent lymph node on the prior exam has significantly decreased in size, now measuring 8 x 10 x 15 mm compared with 18 x 24 x 24 mm previously. No new or enlarging lesions are present. No other significant adenopathy is present. This area demonstrated some uptake on the initial post treatment PET scan. SOFT TISSUES: No mass or fluid collection. BONES: No abnormality. OTHER: Visualized sinuses and mastoid air cells are well aerated. Visualized lungs are clear. IMPRESSION: 1. No residual or recurrent tumor in the oropharynx. 2. Significant interval decrease in size of the previously described low-density left neck collection, likely a lymph node, with no new or enlarging lesions or other significant adenopathy. Ni-RADs 2. PET scan maybe useful to assure resolution of previous activity in this area. Electronically signed by: Lonni Necessary MD 11/04/2024 01:19 PM EST RP Workstation: [id number]    ASSESSMENT & PLAN:  59 y.o.  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 Primary site unclear at this time but is likely oropharyngeal given HPV positivity. Underwent IMRT and weekly Cisplatin  #2  Radiation mucositis grade 2 #3 G-tube in place with mild serous/seropurulent discharge.  No induration or signs of infection. #4 mild protein calorie malnutrition -using tube feeding and hydration regularly at this time. #5 early herpes zoster along the cheek.   PLAN: -  Discussed CT Scan results  Some changes in arteries due to cholesterol deposits CT neck and chest, nothing new of concern Fluid collection has decreased in chest Approaching two year marker since treatment began, good response to radiation Will continue seeing Dr. Jesus every 6 months Will monitor thyroid  function  Scan of arteries in the neck 8-10 years out RTC in 1 year   FOLLOW-UP in 12 months for labs and follow-up with Dr. Onesimo.  The total time spent in the appointment was *** minutes* .  All of the patient's questions were answered and the patient knows to call the clinic with any problems, questions, or concerns.  Emaline Onesimo MD MS AAHIVMS [facility] Hematology/Oncology Physician [facility]  *Total Encounter Time as defined by the Centers for Medicare and Medicaid Services includes, in addition to the face-to-face time of a patient visit (documented in the note above) non-face-to-face time: obtaining and reviewing outside history, ordering and reviewing medications, tests or procedures, care coordination (communications with other health care professionals or caregivers) and documentation in the medical record.  I, Alan Blowers, acting as a neurosurgeon for Emaline Onesimo, MD.,have documented all relevant documentation on the behalf of Emaline Onesimo, MD,as directed by  Emaline Onesimo, MD while in the presence of Emaline Onesimo, MD.  I have reviewed the above documentation for accuracy and completeness, and I agree with the above.  Emaline Onesimo, MD    [1]  Social History Tobacco Use   Smoking status: Never   Smokeless tobacco: Never  Vaping Use   Vaping status: Never Used  Substance Use Topics   Alcohol use: Yes    Comment: Occ  beer or wine    Drug use: No   "

## 2024-11-12 ENCOUNTER — Other Ambulatory Visit: Payer: Self-pay

## 2024-11-14 ENCOUNTER — Telehealth: Payer: Self-pay | Admitting: Orthopedic Surgery

## 2024-11-14 ENCOUNTER — Telehealth: Payer: Self-pay | Admitting: *Deleted

## 2024-11-14 NOTE — Telephone Encounter (Signed)
 Patient stopped by said he tried to call back but he kept playing phone tag. Scheduled patient for 11/24/24 at 4:15pm.

## 2024-11-14 NOTE — Telephone Encounter (Signed)
 Returned the pt's call, lvm for the pt to call back.  He hasn't been here in 77yrs.  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

## 2024-11-15 ENCOUNTER — Other Ambulatory Visit: Payer: Self-pay

## 2024-11-18 ENCOUNTER — Encounter: Payer: Self-pay | Admitting: Hematology

## 2024-11-24 ENCOUNTER — Ambulatory Visit: Admitting: Orthopedic Surgery

## 2024-11-24 ENCOUNTER — Other Ambulatory Visit: Payer: Self-pay

## 2024-11-24 ENCOUNTER — Encounter: Payer: Self-pay | Admitting: Orthopedic Surgery

## 2024-11-24 VITALS — BP 143/95 | HR 60 | Ht 74.0 in | Wt 225.0 lb

## 2024-11-24 DIAGNOSIS — M545 Low back pain, unspecified: Secondary | ICD-10-CM

## 2024-11-24 DIAGNOSIS — M1711 Unilateral primary osteoarthritis, right knee: Secondary | ICD-10-CM

## 2024-11-24 DIAGNOSIS — G8929 Other chronic pain: Secondary | ICD-10-CM

## 2024-11-24 DIAGNOSIS — M25561 Pain in right knee: Secondary | ICD-10-CM

## 2024-11-24 MED ORDER — METHOCARBAMOL 750 MG PO TABS: 750.0000 mg | ORAL_TABLET | Freq: Every evening | ORAL | 2 refills | Status: AC

## 2024-11-24 NOTE — Progress Notes (Signed)
 Dg lumb Intake history:  Chief Complaint  Patient presents with   Knee Pain    Both      BP (!) 143/95   Pulse 60   Ht 6' 2 (1.88 m)   Wt 225 lb (102.1 kg)   BMI 28.89 kg/m  Body mass index is 28.89 kg/m.  Pharmacy? ___[facility] 14___________________________________  WHAT ARE WE SEEING YOU FOR TODAY?   Both knees  How long has this bothered you? (DOI?DOS?WS?)  approximately 3 month(s) ago pain started, at night not painful during the day but they wake him up at night  Was there an injury? No  Anticoag.  No   Any ALLERGIES _______Allergies[1] _______________________________________   Treatment:  Have you taken:  Tylenol  No  Advil  No  Had PT No  Had injection No  Other  _________________________        [1] No Known Allergies

## 2024-11-24 NOTE — Progress Notes (Signed)
 "  Office Visit Note   Patient: Jonathan Hudson           Date of Birth: [birth year]           MRN: [id number] Visit Date: 11/24/2024 Requested by: Shona Norleen PEDLAR, MD [address] [place],  [place] [address] PCP: Shona Norleen PEDLAR, MD   Assessment & Plan:   59 year old male vague symptoms of right knee pain only at night.  Recommend Robaxin  to help with nighttime symptoms as he did have some pain with extension of his back no other symptoms or physical findings.  Follow-up in 4 weeks.  Encounter Diagnoses  Name Primary?   Lumbar pain Yes   Acute pain of right knee    Primary osteoarthritis of right knee     Meds ordered this encounter  Medications   methocarbamol  (ROBAXIN ) 750 MG tablet    Sig: Take 1 tablet (750 mg total) by mouth at bedtime.    Dispense:  60 tablet    Refill:  2     Meds ordered this encounter  Medications   methocarbamol  (ROBAXIN ) 750 MG tablet    Sig: Take 1 tablet (750 mg total) by mouth at bedtime.    Dispense:  60 tablet    Refill:  2      Subjective: Chief Complaint  Patient presents with   Knee Pain    Both     HPI: 59 year old male presents with vague symptoms in his right knee.  Patient says he works all day he has no symptoms but at night he gets a throbbing aching pain in his right knee.  He says he sleeps on his side.  His right knee was injured as a adolescent and he says he had an ACL injury but his x-ray shows a tibial tubercle avulsion treated with a compression screw              ROS: No other symptoms history of cancer   Images personally read and my interpretation :    DG Lumbar Spine 2-3 Views Result Date: 11/24/2024 Pain with extension located in the lower back and night pain in the right knee X-rays lumbar spine show some alteration in the coronal plane alignment but no scoliosis Lateral x-rays did not show any disc space narrowing There may be some mild facet joint arthritis related to age but no acute process  Impression normal spine other than some mild arthritis   DG Knee AP/LAT W/Sunrise Right Result Date: 11/24/2024 Right knee Painful right knee X-ray shows lag screw with washer in the tibial tubercle probably from her prior tibial apophyseal fracture in adolescence There is some heterotopic bone around the proximal portion of the tibial tubercle.  Patellofemoral joint spaces look normal Reduced the lateral compartment joint space narrowing with some osteophyte formation Impression osteoarthritis lateral compartment right knee with prior hardware without complication    Visit Diagnoses:  1. Lumbar pain   2. Acute pain of right knee   3. Primary osteoarthritis of right knee      Follow-Up Instructions: Return in about 1 month (around 12/25/2024) for FOLLOW UP.    Objective: Vital Signs: BP (!) 143/95   Pulse 60   Ht 6' 2 (1.88 m)   Wt 225 lb (102.1 kg)   BMI 28.89 kg/m   Physical Exam Vitals and nursing note reviewed.  Constitutional:      Appearance: Normal appearance.  HENT:     Head: Normocephalic and atraumatic.  Eyes:     General: No scleral icterus.       Right eye: No discharge.        Left eye: No discharge.     Extraocular Movements: Extraocular movements intact.     Conjunctiva/sclera: Conjunctivae normal.     Pupils: Pupils are equal, round, and reactive to light.  Cardiovascular:     Rate and Rhythm: Normal rate.     Pulses: Normal pulses.  Musculoskeletal:     Right knee: No effusion.     Left knee: No effusion.  Skin:    General: Skin is warm and dry.     Capillary Refill: Capillary refill takes less than 2 seconds.  Neurological:     General: No focal deficit present.     Mental Status: He is alert and oriented to person, place, and time.  Psychiatric:        Mood and Affect: Mood normal.        Behavior: Behavior normal.        Thought Content: Thought content normal.        Judgment: Judgment normal.      Right Knee Exam   Muscle Strength   The patient has normal right knee strength.  Tenderness  The patient is experiencing no tenderness.   Range of Motion  Extension:  normal  Flexion:  normal   Tests  Drawer:  Anterior - negative      Other  Erythema: absent Scars: present Sensation: normal Pulse: present Swelling: none Effusion: no effusion present   Left Knee Exam   Muscle Strength  The patient has normal left knee strength.  Tenderness  The patient is experiencing no tenderness.   Range of Motion  Extension:  normal  Flexion:  normal   Tests  Drawer:  Anterior - negative       Other  Erythema: absent Scars: absent Sensation: normal Pulse: present Swelling: none Effusion: no effusion present   Back Exam   Tenderness  The patient is experiencing no tenderness.   Range of Motion  Extension:  abnormal  Flexion:  normal   Comments:  Notable lack of extension with discomfort felt when it trying to extend, normal flexion noted       Specialty Comments:  No specialty comments available.  Imaging: DG Lumbar Spine 2-3 Views Result Date: 11/24/2024 Pain with extension located in the lower back and night pain in the right knee X-rays lumbar spine show some alteration in the coronal plane alignment but no scoliosis Lateral x-rays did not show any disc space narrowing There may be some mild facet joint arthritis related to age but no acute process Impression normal spine other than some mild arthritis   DG Knee AP/LAT W/Sunrise Right Result Date: 11/24/2024 Right knee Painful right knee X-ray shows lag screw with washer in the tibial tubercle probably from her prior tibial apophyseal fracture in adolescence There is some heterotopic bone around the proximal portion of the tibial tubercle.  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   Cervical lymphadenopathy 11/12/2022   Lymphadenopathy 10/13/2022   Special screening for malignant  neoplasms, colon 10/23/2016   Family hx of colon cancer 10/23/2016   Range of motion deficit 03/28/2014   Dehiscence of operative wound 01/24/2014   Fracture of forearm, closed 01/24/2014   Closed fracture of shaft of right radius and ulna 12/28/2013   Forearm fracture 12/28/2013   Fx radius/ulna shaft-closed 12/28/2013   Past Medical History:  Diagnosis Date   Boils 01/01/2017   GERD (gastroesophageal reflux disease)    Hypercholesteremia    Hypertension    Tonsillar cancer (HCC)     Family History  Problem Relation Age of Onset   Cancer Mother        small bowel spread to liver   Pancreatic cancer Father    Cancer Maternal Aunt    Brain cancer Maternal Uncle    Cancer Paternal Aunt    Breast cancer Cousin    Cancer Paternal Uncle     Past Surgical History:  Procedure Laterality Date   BREAST CYST EXCISION Right 03/25/2024   Procedure: EXCISION, CYST, BREAST;  Surgeon: Curvin Deward MOULD, MD;  Location: [facility];  Service: General;  Laterality: Right;  EXCISION LIPOMA RIGHT BREAST   COLONOSCOPY N/A 01/29/2017   Procedure: COLONOSCOPY;  Surgeon: Claudis RAYMOND Rivet, MD;  Location: [facility];  Service: Endoscopy;  Laterality: N/A;  730   COLONOSCOPY WITH PROPOFOL  N/A 04/03/2022   Procedure: COLONOSCOPY WITH PROPOFOL ;  Surgeon: Rivet Claudis RAYMOND, MD;  Location: [facility];  Service: Endoscopy;  Laterality: N/A;  730   cyst removed from left wrist      EYE SURGERY     grafts N/A 10  years  ago   grafts to gums   HEMOSTASIS CLIP PLACEMENT  04/03/2022   Procedure: HEMOSTASIS CLIP PLACEMENT;  Surgeon:  Rivet Claudis RAYMOND, MD;  Location: [facility];  Service: Endoscopy;;   IR GASTROSTOMY TUBE MOD SED  12/15/2022   IR GASTROSTOMY TUBE REMOVAL  05/04/2023   IR IMAGING GUIDED PORT INSERTION  12/15/2022   IR NASO G TUBE PLC W/FL W/RAD  12/15/2022   IR REMOVAL TUN ACCESS W/ PORT W/O FL MOD SED  12/21/2023   OPEN REDUCTION INTERNAL FIXATION (ORIF) DISTAL RADIAL FRACTURE Right 12/28/2013   Procedure: OPEN REDUCTION INTERNAL FIXATION (ORIF) DISTAL RADIAL FRACTURE;  Surgeon: Taft FORBES Minerva, MD;  Location: [facility];  Service: Orthopedics;  Laterality: Right;   ORIF ULNAR FRACTURE Right 12/28/2013   Procedure: OPEN REDUCTION INTERNAL FIXATION (ORIF) ULNAR FRACTURE;  Surgeon: Taft FORBES Minerva, MD;  Location: [facility];  Service: Orthopedics;  Laterality: Right;   POLYPECTOMY  04/03/2022   Procedure: POLYPECTOMY;  Surgeon: Rivet Claudis RAYMOND, MD;  Location: [facility];  Service: Endoscopy;;   right knee Right teenager   right knee    Social History   Occupational History   Not on file  Tobacco Use   Smoking status: Never   Smokeless tobacco: Never  Vaping Use   Vaping status: Never Used  Substance and Sexual Activity   Alcohol use: Yes    Comment: Occ  beer or wine    Drug use: No   Sexual activity: Yes      "

## 2024-12-06 ENCOUNTER — Other Ambulatory Visit: Payer: Self-pay

## 2024-12-26 ENCOUNTER — Ambulatory Visit: Admitting: Orthopedic Surgery

## 2025-05-19 ENCOUNTER — Inpatient Hospital Stay

## 2025-05-19 ENCOUNTER — Inpatient Hospital Stay: Admitting: Adult Health

## 2025-11-14 ENCOUNTER — Inpatient Hospital Stay

## 2025-11-14 ENCOUNTER — Inpatient Hospital Stay: Admitting: Hematology
